# Patient Record
Sex: Female | Born: 1950 | Race: White | Hispanic: No | Marital: Married | Smoking: Former smoker
Health system: Southern US, Community
[De-identification: ages and names within clinical notes are randomized; demographics above are authoritative.]

## PROBLEM LIST (undated history)

## (undated) DIAGNOSIS — N83209 Unspecified ovarian cyst, unspecified side: Secondary | ICD-10-CM

## (undated) DIAGNOSIS — E559 Vitamin D deficiency, unspecified: Secondary | ICD-10-CM

## (undated) DIAGNOSIS — I824Y2 Acute embolism and thrombosis of unspecified deep veins of left proximal lower extremity: Secondary | ICD-10-CM

## (undated) DIAGNOSIS — I83893 Varicose veins of bilateral lower extremities with other complications: Secondary | ICD-10-CM

## (undated) DIAGNOSIS — K589 Irritable bowel syndrome without diarrhea: Secondary | ICD-10-CM

## (undated) DIAGNOSIS — K760 Fatty (change of) liver, not elsewhere classified: Secondary | ICD-10-CM

## (undated) DIAGNOSIS — A0472 Enterocolitis due to Clostridium difficile, not specified as recurrent: Secondary | ICD-10-CM

## (undated) DIAGNOSIS — N390 Urinary tract infection, site not specified: Secondary | ICD-10-CM

## (undated) DIAGNOSIS — Z1211 Encounter for screening for malignant neoplasm of colon: Secondary | ICD-10-CM

## (undated) DIAGNOSIS — Z881 Allergy status to other antibiotic agents status: Secondary | ICD-10-CM

## (undated) DIAGNOSIS — M51369 Other intervertebral disc degeneration, lumbar region without mention of lumbar back pain or lower extremity pain: Secondary | ICD-10-CM

## (undated) DIAGNOSIS — M8580 Other specified disorders of bone density and structure, unspecified site: Secondary | ICD-10-CM

## (undated) DIAGNOSIS — E785 Hyperlipidemia, unspecified: Secondary | ICD-10-CM

## (undated) DIAGNOSIS — M5136 Other intervertebral disc degeneration, lumbar region: Secondary | ICD-10-CM

## (undated) DIAGNOSIS — I82562 Chronic embolism and thrombosis of left calf muscular vein: Secondary | ICD-10-CM

## (undated) DIAGNOSIS — D649 Anemia, unspecified: Secondary | ICD-10-CM

## (undated) DIAGNOSIS — M542 Cervicalgia: Secondary | ICD-10-CM

## (undated) HISTORY — DX: Vitamin D deficiency, unspecified: E55.9

## (undated) HISTORY — PX: TUBAL LIGATION: SHX77

## (undated) HISTORY — DX: Allergy status to other antibiotic agents: Z88.1

## (undated) HISTORY — DX: Irritable bowel syndrome, unspecified: K58.9

## (undated) HISTORY — DX: Chronic embolism and thrombosis of left calf muscular vein: I82.562

## (undated) HISTORY — DX: Hyperlipidemia, unspecified: E78.5

## (undated) HISTORY — DX: Varicose veins of bilateral lower extremities with other complications: I83.893

## (undated) HISTORY — DX: Other intervertebral disc degeneration, lumbar region without mention of lumbar back pain or lower extremity pain: M51.369

## (undated) HISTORY — DX: Cervicalgia: M54.2

## (undated) HISTORY — DX: Unspecified ovarian cyst, unspecified side: N83.209

## (undated) HISTORY — DX: Other specified disorders of bone density and structure, unspecified site: M85.80

## (undated) HISTORY — DX: Acute embolism and thrombosis of unspecified deep veins of left proximal lower extremity: I82.4Y2

## (undated) HISTORY — DX: Encounter for screening for malignant neoplasm of colon: Z12.11

## (undated) HISTORY — DX: Other intervertebral disc degeneration, lumbar region: M51.36

## (undated) HISTORY — DX: Urinary tract infection, site not specified: N39.0

## (undated) HISTORY — DX: Anemia, unspecified: D64.9

## (undated) HISTORY — PX: TONSILLECTOMY: SUR1361

## (undated) HISTORY — DX: Fatty (change of) liver, not elsewhere classified: K76.0

## (undated) HISTORY — PX: CARPAL TUNNEL RELEASE: SHX101

## (undated) HISTORY — PX: ARTHROSCOPIC REPAIR ACL: SUR80

---

## 1979-08-24 HISTORY — PX: VEIN LIGATION AND STRIPPING: SHX2653

## 1986-08-23 HISTORY — PX: OVARIAN CYST REMOVAL: SHX89

## 1992-08-23 HISTORY — PX: BUNIONECTOMY: SHX129

## 1998-08-23 HISTORY — PX: CARPAL TUNNEL RELEASE: SHX101

## 2005-07-12 ENCOUNTER — Ambulatory Visit: Payer: Self-pay | Admitting: Family Medicine

## 2005-09-23 ENCOUNTER — Encounter: Payer: Self-pay | Admitting: Family Medicine

## 2005-09-23 LAB — CONVERTED CEMR LAB

## 2005-09-29 ENCOUNTER — Ambulatory Visit: Payer: Self-pay | Admitting: Family Medicine

## 2005-09-29 ENCOUNTER — Other Ambulatory Visit: Admission: RE | Admit: 2005-09-29 | Discharge: 2005-09-29 | Payer: Self-pay | Admitting: Family Medicine

## 2005-10-07 ENCOUNTER — Ambulatory Visit: Payer: Self-pay | Admitting: Family Medicine

## 2005-10-28 ENCOUNTER — Ambulatory Visit: Payer: Self-pay | Admitting: Family Medicine

## 2006-02-21 ENCOUNTER — Ambulatory Visit: Payer: Self-pay | Admitting: Family Medicine

## 2006-04-18 ENCOUNTER — Ambulatory Visit: Payer: Self-pay | Admitting: Family Medicine

## 2006-05-02 ENCOUNTER — Ambulatory Visit: Payer: Self-pay | Admitting: Family Medicine

## 2006-05-23 ENCOUNTER — Ambulatory Visit: Payer: Self-pay | Admitting: Family Medicine

## 2006-05-31 DIAGNOSIS — E785 Hyperlipidemia, unspecified: Secondary | ICD-10-CM | POA: Insufficient documentation

## 2006-07-08 ENCOUNTER — Encounter: Payer: Self-pay | Admitting: Family Medicine

## 2006-07-12 ENCOUNTER — Encounter: Payer: Self-pay | Admitting: Family Medicine

## 2006-07-12 LAB — CONVERTED CEMR LAB
ALT: 47 units/L — ABNORMAL HIGH (ref 0–35)
AST: 31 units/L (ref 0–37)
Albumin: 4.6 g/dL (ref 3.5–5.2)
Alkaline Phosphatase: 79 units/L (ref 39–117)
BUN: 12 mg/dL (ref 6–23)
CO2: 23 meq/L (ref 19–32)
Calcium: 9.8 mg/dL (ref 8.4–10.5)
Chloride: 103 meq/L (ref 96–112)
Cholesterol: 284 mg/dL — ABNORMAL HIGH (ref 0–200)
Creatinine, Ser: 1.01 mg/dL (ref 0.40–1.20)
Glucose, Bld: 87 mg/dL (ref 70–99)
HCT: 43.9 % — ABNORMAL HIGH (ref 34.4–43.3)
HDL: 79 mg/dL (ref >39)
Hemoglobin: 14.7 g/dL (ref 11.7–14.8)
LDL Cholesterol: 176 mg/dL — ABNORMAL HIGH (ref 0–99)
MCHC: 33.5 g/dL (ref 33.1–35.4)
MCV: 91.8 fL (ref 78.8–100.0)
Platelets: 289 10*3/uL (ref 152–374)
Potassium: 4.6 meq/L (ref 3.5–5.3)
RBC: 4.78 M/uL (ref 3.79–4.96)
RDW: 13.1 % (ref 11.5–15.3)
Sodium: 142 meq/L (ref 135–145)
TSH: 2.929 microintl units/mL (ref 0.350–5.50)
Total Bilirubin: 0.8 mg/dL (ref 0.3–1.2)
Total CHOL/HDL Ratio: 3.6
Total Protein: 7.3 g/dL (ref 6.0–8.3)
Triglycerides: 145 mg/dL (ref <150)
VLDL: 29 mg/dL (ref 0–40)
Vit D, 1,25-Dihydroxy: 32 (ref 20–57)
WBC: 3.9 10*3/uL (ref 3.7–10.0)

## 2006-07-20 ENCOUNTER — Ambulatory Visit: Payer: Self-pay | Admitting: Family Medicine

## 2006-07-20 DIAGNOSIS — M25569 Pain in unspecified knee: Secondary | ICD-10-CM | POA: Insufficient documentation

## 2006-07-20 DIAGNOSIS — R350 Frequency of micturition: Secondary | ICD-10-CM | POA: Insufficient documentation

## 2006-07-20 HISTORY — DX: Pain in unspecified knee: M25.569

## 2006-08-01 ENCOUNTER — Telehealth: Payer: Self-pay | Admitting: Family Medicine

## 2006-08-12 ENCOUNTER — Ambulatory Visit: Payer: Self-pay | Admitting: Family Medicine

## 2006-08-26 ENCOUNTER — Telehealth: Payer: Self-pay | Admitting: Family Medicine

## 2006-08-26 ENCOUNTER — Ambulatory Visit: Payer: Self-pay | Admitting: Family Medicine

## 2006-08-26 DIAGNOSIS — L989 Disorder of the skin and subcutaneous tissue, unspecified: Secondary | ICD-10-CM | POA: Insufficient documentation

## 2006-08-29 ENCOUNTER — Telehealth: Payer: Self-pay | Admitting: Family Medicine

## 2006-09-06 ENCOUNTER — Encounter: Payer: Self-pay | Admitting: Family Medicine

## 2006-09-14 ENCOUNTER — Telehealth: Payer: Self-pay | Admitting: Family Medicine

## 2006-09-14 ENCOUNTER — Telehealth (INDEPENDENT_AMBULATORY_CARE_PROVIDER_SITE_OTHER): Payer: Self-pay | Admitting: *Deleted

## 2007-01-24 ENCOUNTER — Telehealth: Payer: Self-pay | Admitting: Family Medicine

## 2007-01-27 ENCOUNTER — Other Ambulatory Visit: Admission: RE | Admit: 2007-01-27 | Discharge: 2007-01-27 | Payer: Self-pay | Admitting: Family Medicine

## 2007-01-27 ENCOUNTER — Encounter: Payer: Self-pay | Admitting: Family Medicine

## 2007-01-27 ENCOUNTER — Ambulatory Visit: Payer: Self-pay | Admitting: Family Medicine

## 2007-01-27 DIAGNOSIS — H9209 Otalgia, unspecified ear: Secondary | ICD-10-CM | POA: Insufficient documentation

## 2007-01-28 ENCOUNTER — Encounter: Payer: Self-pay | Admitting: Family Medicine

## 2007-01-30 ENCOUNTER — Encounter: Payer: Self-pay | Admitting: Family Medicine

## 2007-01-30 LAB — CONVERTED CEMR LAB
Cholesterol, target level: 200 mg/dL
Cholesterol: 310 mg/dL — ABNORMAL HIGH (ref 0–200)
HDL goal, serum: 40 mg/dL
HDL: 67 mg/dL (ref >39)
LDL Cholesterol: 202 mg/dL — ABNORMAL HIGH (ref 0–99)
LDL Goal: 160 mg/dL
Total CHOL/HDL Ratio: 4.6
Triglycerides: 206 mg/dL — ABNORMAL HIGH (ref <150)
VLDL: 41 mg/dL — ABNORMAL HIGH (ref 0–40)

## 2007-02-02 ENCOUNTER — Telehealth: Payer: Self-pay | Admitting: Family Medicine

## 2007-02-08 ENCOUNTER — Encounter: Payer: Self-pay | Admitting: Family Medicine

## 2007-04-11 ENCOUNTER — Ambulatory Visit: Payer: Self-pay | Admitting: Family Medicine

## 2007-04-11 DIAGNOSIS — L509 Urticaria, unspecified: Secondary | ICD-10-CM | POA: Insufficient documentation

## 2007-04-12 ENCOUNTER — Telehealth: Payer: Self-pay | Admitting: Family Medicine

## 2007-05-24 ENCOUNTER — Ambulatory Visit: Payer: Self-pay | Admitting: Family Medicine

## 2007-05-24 DIAGNOSIS — R209 Unspecified disturbances of skin sensation: Secondary | ICD-10-CM | POA: Insufficient documentation

## 2007-05-29 ENCOUNTER — Encounter: Payer: Self-pay | Admitting: Family Medicine

## 2007-06-21 ENCOUNTER — Encounter: Payer: Self-pay | Admitting: Family Medicine

## 2007-06-22 LAB — CONVERTED CEMR LAB
Folate: 19.2 ng/mL
Iron: 110 ug/dL (ref 42–145)
Saturation Ratios: 31 % (ref 20–55)
TIBC: 355 ug/dL (ref 250–470)
UIBC: 245 ug/dL
Vitamin B-12: 659 pg/mL (ref 211–911)

## 2007-06-26 ENCOUNTER — Ambulatory Visit: Payer: Self-pay | Admitting: Family Medicine

## 2007-09-27 ENCOUNTER — Ambulatory Visit: Payer: Self-pay | Admitting: Family Medicine

## 2007-09-27 DIAGNOSIS — R1032 Left lower quadrant pain: Secondary | ICD-10-CM | POA: Insufficient documentation

## 2007-09-27 LAB — CONVERTED CEMR LAB
Bilirubin Urine: NEGATIVE
Blood in Urine, dipstick: NEGATIVE
Glucose, Urine, Semiquant: NEGATIVE
Ketones, urine, test strip: NEGATIVE
Nitrite: NEGATIVE
Protein, U semiquant: NEGATIVE
Specific Gravity, Urine: 1.015
Urobilinogen, UA: 0.2
WBC Urine, dipstick: NEGATIVE
pH: 7

## 2007-09-28 DIAGNOSIS — R945 Abnormal results of liver function studies: Secondary | ICD-10-CM | POA: Insufficient documentation

## 2007-09-28 HISTORY — DX: Abnormal results of liver function studies: R94.5

## 2007-09-28 LAB — CONVERTED CEMR LAB
ALT: 55 units/L — ABNORMAL HIGH (ref 0–35)
AST: 41 units/L — ABNORMAL HIGH (ref 0–37)
Albumin: 4.6 g/dL (ref 3.5–5.2)
Alkaline Phosphatase: 69 units/L (ref 39–117)
BUN: 11 mg/dL (ref 6–23)
Basophils Absolute: 0 10*3/uL (ref 0.0–0.1)
Basophils Relative: 1 % (ref 0–1)
CA 125: 9.4 units/mL (ref 0.0–30.2)
CO2: 25 meq/L (ref 19–32)
CRP: 0.1 mg/dL (ref <0.6)
Calcium: 9.6 mg/dL (ref 8.4–10.5)
Chloride: 105 meq/L (ref 96–112)
Creatinine, Ser: 0.85 mg/dL (ref 0.40–1.20)
Eosinophils Absolute: 0.1 10*3/uL (ref 0.0–0.7)
Eosinophils Relative: 3 % (ref 0–5)
Glucose, Bld: 84 mg/dL (ref 70–99)
HCT: 42.6 % (ref 36.0–46.0)
Hemoglobin: 14.5 g/dL (ref 12.0–15.0)
Lymphocytes Relative: 46 % (ref 12–46)
Lymphs Abs: 2 10*3/uL (ref 0.7–4.0)
MCHC: 34 g/dL (ref 30.0–36.0)
MCV: 92.6 fL (ref 78.0–100.0)
Monocytes Absolute: 0.3 10*3/uL (ref 0.1–1.0)
Monocytes Relative: 7 % (ref 3–12)
Neutro Abs: 1.9 10*3/uL (ref 1.7–7.7)
Neutrophils Relative %: 44 % (ref 43–77)
Platelets: 297 10*3/uL (ref 150–400)
Potassium: 4.5 meq/L (ref 3.5–5.3)
RBC: 4.6 M/uL (ref 3.87–5.11)
RDW: 13.3 % (ref 11.5–15.5)
Sodium: 142 meq/L (ref 135–145)
Total Bilirubin: 0.6 mg/dL (ref 0.3–1.2)
Total Protein: 7.1 g/dL (ref 6.0–8.3)
WBC: 4.3 10*3/uL (ref 4.0–10.5)

## 2007-10-05 ENCOUNTER — Encounter: Payer: Self-pay | Admitting: Family Medicine

## 2007-10-10 ENCOUNTER — Telehealth: Payer: Self-pay | Admitting: Family Medicine

## 2007-10-10 ENCOUNTER — Encounter: Payer: Self-pay | Admitting: Family Medicine

## 2007-10-30 ENCOUNTER — Telehealth: Payer: Self-pay | Admitting: Family Medicine

## 2007-11-06 ENCOUNTER — Encounter: Payer: Self-pay | Admitting: Family Medicine

## 2007-11-07 LAB — CONVERTED CEMR LAB
ALT: 36 units/L — ABNORMAL HIGH (ref 0–35)
AST: 30 units/L (ref 0–37)
Albumin: 4.5 g/dL (ref 3.5–5.2)
Alkaline Phosphatase: 69 units/L (ref 39–117)
BUN: 8 mg/dL (ref 6–23)
CO2: 25 meq/L (ref 19–32)
Calcium: 9.4 mg/dL (ref 8.4–10.5)
Chloride: 105 meq/L (ref 96–112)
Cholesterol: 237 mg/dL — ABNORMAL HIGH (ref 0–200)
Creatinine, Ser: 0.81 mg/dL (ref 0.40–1.20)
Glucose, Bld: 88 mg/dL (ref 70–99)
HDL: 57 mg/dL (ref >39)
LDL Cholesterol: 125 mg/dL — ABNORMAL HIGH (ref 0–99)
Potassium: 4.5 meq/L (ref 3.5–5.3)
Sodium: 143 meq/L (ref 135–145)
Total Bilirubin: 0.6 mg/dL (ref 0.3–1.2)
Total CHOL/HDL Ratio: 4.2
Total Protein: 6.7 g/dL (ref 6.0–8.3)
Triglycerides: 277 mg/dL — ABNORMAL HIGH (ref <150)
VLDL: 55 mg/dL — ABNORMAL HIGH (ref 0–40)

## 2007-11-22 LAB — HM COLONOSCOPY: HM Colonoscopy: NORMAL

## 2007-11-30 ENCOUNTER — Ambulatory Visit: Payer: Self-pay | Admitting: Internal Medicine

## 2007-12-14 ENCOUNTER — Encounter: Payer: Self-pay | Admitting: Internal Medicine

## 2007-12-14 ENCOUNTER — Ambulatory Visit: Payer: Self-pay | Admitting: Internal Medicine

## 2007-12-19 ENCOUNTER — Telehealth: Payer: Self-pay | Admitting: Family Medicine

## 2008-01-04 ENCOUNTER — Telehealth: Payer: Self-pay | Admitting: Family Medicine

## 2008-03-14 ENCOUNTER — Ambulatory Visit: Payer: Self-pay | Admitting: Family Medicine

## 2008-04-03 ENCOUNTER — Telehealth: Payer: Self-pay | Admitting: Family Medicine

## 2008-04-09 ENCOUNTER — Encounter: Admission: RE | Admit: 2008-04-09 | Discharge: 2008-04-09 | Payer: Self-pay | Admitting: Family Medicine

## 2008-04-09 ENCOUNTER — Encounter: Payer: Self-pay | Admitting: Family Medicine

## 2008-10-07 ENCOUNTER — Encounter: Admission: RE | Admit: 2008-10-07 | Discharge: 2008-10-07 | Payer: Self-pay | Admitting: Family Medicine

## 2008-10-07 ENCOUNTER — Ambulatory Visit: Payer: Self-pay | Admitting: Family Medicine

## 2008-10-07 DIAGNOSIS — R0789 Other chest pain: Secondary | ICD-10-CM | POA: Insufficient documentation

## 2008-10-08 LAB — CONVERTED CEMR LAB
ALT: 29 units/L (ref 0–35)
AST: 20 units/L (ref 0–37)
Albumin: 4.6 g/dL (ref 3.5–5.2)
Alkaline Phosphatase: 66 units/L (ref 39–117)
BUN: 14 mg/dL (ref 6–23)
Basophils Absolute: 0 10*3/uL (ref 0.0–0.1)
Basophils Relative: 1 % (ref 0–1)
CO2: 23 meq/L (ref 19–32)
Calcium: 9.9 mg/dL (ref 8.4–10.5)
Chloride: 106 meq/L (ref 96–112)
Creatinine, Ser: 1.04 mg/dL (ref 0.40–1.20)
Eosinophils Absolute: 0.1 10*3/uL (ref 0.0–0.7)
Eosinophils Relative: 3 % (ref 0–5)
Glucose, Bld: 86 mg/dL (ref 70–99)
HCT: 39.7 % (ref 36.0–46.0)
Hemoglobin: 13.5 g/dL (ref 12.0–15.0)
Lymphocytes Relative: 44 % (ref 12–46)
Lymphs Abs: 2.3 10*3/uL (ref 0.7–4.0)
MCHC: 34 g/dL (ref 30.0–36.0)
MCV: 89 fL (ref 78.0–100.0)
Monocytes Absolute: 0.5 10*3/uL (ref 0.1–1.0)
Monocytes Relative: 10 % (ref 3–12)
Neutro Abs: 2.2 10*3/uL (ref 1.7–7.7)
Neutrophils Relative %: 43 % (ref 43–77)
Platelets: 283 10*3/uL (ref 150–400)
Potassium: 4.7 meq/L (ref 3.5–5.3)
RBC: 4.46 M/uL (ref 3.87–5.11)
RDW: 13 % (ref 11.5–15.5)
Sodium: 142 meq/L (ref 135–145)
TSH: 1.858 microintl units/mL (ref 0.350–4.50)
Total Bilirubin: 0.4 mg/dL (ref 0.3–1.2)
Total Protein: 7 g/dL (ref 6.0–8.3)
WBC: 5.2 10*3/uL (ref 4.0–10.5)

## 2009-03-11 ENCOUNTER — Telehealth: Payer: Self-pay | Admitting: Family Medicine

## 2009-03-24 ENCOUNTER — Encounter: Payer: Self-pay | Admitting: Family Medicine

## 2009-03-24 LAB — HM MAMMOGRAPHY: HM Mammogram: NORMAL

## 2009-03-27 ENCOUNTER — Encounter: Payer: Self-pay | Admitting: Family Medicine

## 2009-04-22 ENCOUNTER — Encounter: Admission: RE | Admit: 2009-04-22 | Discharge: 2009-04-22 | Payer: Self-pay | Admitting: Family Medicine

## 2009-04-22 ENCOUNTER — Ambulatory Visit: Payer: Self-pay | Admitting: Family Medicine

## 2009-09-26 ENCOUNTER — Encounter: Payer: Self-pay | Admitting: Family Medicine

## 2009-10-23 ENCOUNTER — Ambulatory Visit: Payer: Self-pay | Admitting: Family Medicine

## 2009-10-23 ENCOUNTER — Encounter: Admission: RE | Admit: 2009-10-23 | Discharge: 2009-10-23 | Payer: Self-pay | Admitting: Family Medicine

## 2009-10-23 DIAGNOSIS — M79609 Pain in unspecified limb: Secondary | ICD-10-CM | POA: Insufficient documentation

## 2009-10-24 ENCOUNTER — Telehealth: Payer: Self-pay | Admitting: Family Medicine

## 2009-10-25 ENCOUNTER — Encounter: Payer: Self-pay | Admitting: Family Medicine

## 2009-10-28 ENCOUNTER — Encounter: Payer: Self-pay | Admitting: Family Medicine

## 2009-10-28 LAB — CONVERTED CEMR LAB
Anti Nuclear Antibody(ANA): NEGATIVE
Basophils Absolute: 0 10*3/uL (ref 0.0–0.1)
Basophils Relative: 1 % (ref 0–1)
CRP: 0.1 mg/dL (ref <0.6)
Eosinophils Absolute: 0.1 10*3/uL (ref 0.0–0.7)
Eosinophils Relative: 3 % (ref 0–5)
HCT: 40.9 % (ref 36.0–46.0)
Hemoglobin: 13.5 g/dL (ref 12.0–15.0)
Lymphocytes Relative: 48 % — ABNORMAL HIGH (ref 12–46)
Lymphs Abs: 2.5 10*3/uL (ref 0.7–4.0)
MCHC: 33 g/dL (ref 30.0–36.0)
MCV: 91.9 fL (ref 78.0–100.0)
Monocytes Absolute: 0.4 10*3/uL (ref 0.1–1.0)
Monocytes Relative: 8 % (ref 3–12)
Neutro Abs: 2.2 10*3/uL (ref 1.7–7.7)
Neutrophils Relative %: 41 % — ABNORMAL LOW (ref 43–77)
Platelets: 270 10*3/uL (ref 150–400)
RBC: 4.45 M/uL (ref 3.87–5.11)
RDW: 13 % (ref 11.5–15.5)
Rhuematoid fact SerPl-aCnc: <20 intl units/mL (ref 0–20)
Sed Rate: 5 mm/hr (ref 0–22)
WBC: 5.3 10*3/uL (ref 4.0–10.5)

## 2009-12-10 ENCOUNTER — Other Ambulatory Visit: Admission: RE | Admit: 2009-12-10 | Discharge: 2009-12-10 | Payer: Self-pay | Admitting: Family Medicine

## 2009-12-10 ENCOUNTER — Ambulatory Visit: Payer: Self-pay | Admitting: Family Medicine

## 2009-12-10 DIAGNOSIS — L608 Other nail disorders: Secondary | ICD-10-CM | POA: Insufficient documentation

## 2009-12-10 LAB — CONVERTED CEMR LAB
Bilirubin Urine: NEGATIVE
Blood in Urine, dipstick: NEGATIVE
Glucose, Urine, Semiquant: NEGATIVE
Ketones, urine, test strip: NEGATIVE
Nitrite: NEGATIVE
Protein, U semiquant: NEGATIVE
Specific Gravity, Urine: 1.015
Urobilinogen, UA: 0.2
WBC Urine, dipstick: NEGATIVE
pH: 7.5

## 2009-12-11 ENCOUNTER — Encounter: Payer: Self-pay | Admitting: Family Medicine

## 2009-12-11 LAB — CONVERTED CEMR LAB
ALT: 44 units/L — ABNORMAL HIGH (ref 0–35)
AST: 26 units/L (ref 0–37)
Albumin: 4.5 g/dL (ref 3.5–5.2)
Alkaline Phosphatase: 63 units/L (ref 39–117)
BUN: 10 mg/dL (ref 6–23)
CO2: 23 meq/L (ref 19–32)
Calcium: 9.7 mg/dL (ref 8.4–10.5)
Chloride: 103 meq/L (ref 96–112)
Cholesterol: 283 mg/dL — ABNORMAL HIGH (ref 0–200)
Creatinine, Ser: 0.86 mg/dL (ref 0.40–1.20)
Glucose, Bld: 84 mg/dL (ref 70–99)
HDL: 62 mg/dL (ref >39)
LDL Cholesterol: 173 mg/dL — ABNORMAL HIGH (ref 0–99)
Potassium: 4.4 meq/L (ref 3.5–5.3)
Sodium: 139 meq/L (ref 135–145)
TSH: 2.762 microintl units/mL (ref 0.350–4.500)
Total Bilirubin: 0.8 mg/dL (ref 0.3–1.2)
Total CHOL/HDL Ratio: 4.6
Total Protein: 6.8 g/dL (ref 6.0–8.3)
Triglycerides: 239 mg/dL — ABNORMAL HIGH (ref <150)
VLDL: 48 mg/dL — ABNORMAL HIGH (ref 0–40)

## 2009-12-15 LAB — CONVERTED CEMR LAB
Pap Smear: NEGATIVE
Pap Smear: NORMAL

## 2009-12-19 ENCOUNTER — Encounter: Admission: RE | Admit: 2009-12-19 | Discharge: 2009-12-19 | Payer: Self-pay | Admitting: Family Medicine

## 2010-02-14 ENCOUNTER — Ambulatory Visit: Payer: Self-pay | Admitting: Family Medicine

## 2010-02-16 ENCOUNTER — Telehealth (INDEPENDENT_AMBULATORY_CARE_PROVIDER_SITE_OTHER): Payer: Self-pay | Admitting: *Deleted

## 2010-02-16 ENCOUNTER — Telehealth: Payer: Self-pay | Admitting: Family Medicine

## 2010-02-16 ENCOUNTER — Telehealth: Payer: Self-pay | Admitting: Emergency Medicine

## 2010-05-14 ENCOUNTER — Ambulatory Visit: Payer: Self-pay | Admitting: Family Medicine

## 2010-05-14 ENCOUNTER — Encounter: Admission: RE | Admit: 2010-05-14 | Discharge: 2010-05-14 | Payer: Self-pay | Admitting: Family Medicine

## 2010-05-14 DIAGNOSIS — M542 Cervicalgia: Secondary | ICD-10-CM | POA: Insufficient documentation

## 2010-09-13 ENCOUNTER — Encounter: Payer: Self-pay | Admitting: Family Medicine

## 2010-09-24 NOTE — Progress Notes (Signed)
Summary: Question about colonoscopy  Phone Note Call from Patient Call back at Home Phone [phone]   Caller: Patient Call For: Nani Gasser MD Summary of Call: Pt wants your opinion on where to go for colonoscopy. Seen Dr. Orpah Cobb at [facility] and he does in office colons but wanted your opinion whether to have done with him or if you know a better GI to do procedure Initial call taken by: Kathlene November,  October 10, 2007 3:22 PM  Follow-up for Phone Call        Called pt: The main concern is safety adn cleanliness of an outpatient facility. She is otherwise helahty and is a good candidate for outpatient but certainely if she has concerns then can call their office and see if there is an option to do it at the hospital or a day surgey center.  Follow-up by: Nani Gasser MD,  October 10, 2007 4:52 PM

## 2010-09-24 NOTE — Letter (Signed)
Summary: Lipid Letter  [facility]  [address]   [place], [place] [address]   Phone: [phone]  Fax: [phone]    01/30/2007  Tiffany Bender [address] [place], [place]  [address]  Dear Corrie Dandy:  We have carefully reviewed your last lipid profile from 01/28/2007 and the results are noted below with a summary of recommendations for lipid management.    Cholesterol:      310     Goal: <200   HDL "good" Cholesterol:   67     Goal: >49   LDL "bad" Cholesterol:    956     Goal: <160   Triglycerides:      206     Goal: <150    Your lipid goals have not been met; we recommend improving on your TLC diet and Adjunctive Measures (see below) and make the following changes to your regimen.  Please reconsider a cholesterol lowering medication. We can recheck levels in 3 months.:    TLC Diet (Therapeutic Lifestyle Change): Total Fat should be no greater than 25-35% of total calories Carbohydrates should be 50-60% of total calories Protein should be approximately 15% of total calories Fiber should be at least 20-30 grams a day ***Increased fiber may help lower LDL Total Cholesterol should be < 200mg /day Consider adding plant stanol/sterols to diet (example: Benacol spread) ***A higher intake of unsaturated fat may reduce Triglycerides and Increase HDL    Adjunctive Measures (may lower LIPIDS and reduce risk of Heart Attack) include: Aerobic Exercise (20-30 minutes 3-4 times a week) Limit Alcohol Consumption Weight Reduction Aspirin 75-81 mg a day by mouth (if not allergic or contraindicated) Folic Acid 1mg  a day by mouth Dietary Fiber 20-30 grams a day by mouth     Current Medications: 1)    Calcium-vitamin D 250-125 Mg-unit Tabs (Calcium carbonate-vitamin d) 2)    Multivitamins  Tabs (Multiple vitamin) .... Once daily 3)    Sea-omega 1000 Mg Caps (omega-3 Fatty Acids)   If you have any questions, please call. We appreciate being able to work  with you.   Sincerely,    [facility] Nani Gasser MD
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
## 2010-09-24 NOTE — Assessment & Plan Note (Signed)
Summary: POSS SPIDER BITE/PAM   Vitals Entered By: Edmonia Lynch (August 12, 2006 2:29 PM)             Last PAP Result Done PapHx  Done (09/23/2005 10:54:11 AM)     Prior Medications: CALCIUM-VITAMIN D 250-125 MG-UNIT TABS (CALCIUM CARBONATE-VITAMIN D)  MULTIVITAMINS  TABS (MULTIPLE VITAMIN) once daily LOVAZA 1 GM CAPS (OMEGA-3-ACID ETHYL ESTERS) 4 tabs by mouth daily Current Allergies: CIPRO Visit Type:  acute PCP:  Linford Arnold, C  Chief Complaint:  C/O Spider bite right leg.  History of Present Illness: 60 yo WF presents for an itchy scaley spot on the back of her right leg.  It has been there for over 1 month. She thinks it was a spider bite that occured while working out in the yard, but never did see an Armed forces logistics/support/administrative officer.  It has been itchy, especially after contact with water for the last month.  She is trying not to scratch it.  It is not oozing or bleeding.  Not using anything topically.  O/w has been well, no fevers or body rash.  Past Medical History:    Boderline Osteopenia     G2 P2     Hx Ovarian cysts     Meds: Red Yeast Rice Capsules, Omega-3, nail vitam  Past Surgical History:    ACL repair     R carpal tunnel syndrome sx-Dr. Manson Passey  Family History:    Father-alcoholism     O/W Adopted and fam Hx unknown  Physical Exam  General:     alert, well-developed, well-nourished, and well-hydrated.   Head:     normocephalic and atraumatic.   Skin:     1.5 cm poorly defined scaley pink skin lesion at the distal R calf.  No surrounding erythema, non-vesicular   Review of Systems      See HPI  Impression & Recommendations:  Problem # 1:  PRURITIC DISORDER NOS (ICD-698.9) Skin lesion of unknown etilology with pruritis.  Probable healing insect bite.  Will treat for itching with Elocon 1% ointment at bedtime for 1 wk.  If not improving, recommend taking another look at it.  SCC can sometimes mimic this appearance.  Recommend not scratching as she may introduce skin  bacteria.  Medications Added to Medication List This Visit: 1)  Elocon 0.1 % Oint (Mometasone furoate) .... Apply at bedtime to skin x 7 days  Patient Instructions: 1)  Try not to scratch itchy spot on leg. 2)  Use Elocon ointment at bedtime for 1 week. 3)  If not improving after 7 days, please call and make appt to come back.

## 2010-09-24 NOTE — Progress Notes (Signed)
Summary: Discussion of Colonoscopy Results  Phone Note Call from Patient Call back at Home Phone [phone]   Caller: Patient Call For: Nani Gasser MD Reason for Call: Talk to Doctor Details for Reason: Results of Colonoscopy Summary of Call: Patient called and told me that she had her colonoscopy last week. Her GI Doctor told her to discuss the results of the colonoscopy with you. Her phone number is [phone]. She will be out this morning and will return this afternoon.  Initial call taken by: Jolyne Loa RN,  December 19, 2007 9:11 AM  Follow-up for Phone Call        Please let her know I have not received the colonoscopy report yet.  Follow-up by: Nani Gasser MD,  December 19, 2007 12:09 PM  Additional Follow-up for Phone Call Additional follow up Details #1::        PATIENT INFORMED. Additional Follow-up by: Harlene Salts,  December 19, 2007 4:32 PM

## 2010-09-24 NOTE — Assessment & Plan Note (Signed)
Summary: Neck Pain, tinnitis   Vital Signs:  Patient profile:   60 year old female Menstrual status:  postmenopausal Height:      64 inches Weight:      159 pounds Pulse rate:   78 / minute BP sitting:   138 / 85  (right arm) Cuff size:   regular  Vitals Entered By: Avon Gully CMA, Duncan Dull) (May 14, 2010 10:06 AM) CC:  neck stiffness, ringing in the left ear x 3 weeks, had been seeing a chiropracter who had been working on her neck  Hearing Screen  20db HL: Left  500 hz: No Response 1000 hz: 20db 2000 hz: 20db 4000 hz: No Response Right  500 hz: No Response 1000 hz: No Response 2000 hz: No Response 4000 hz: No Response   Hearing Testing Entered By: Avon Gully CMA, (AAMA) (May 14, 2010 10:35 AM) 25db HL: Left  500 hz: No Response 1000 hz: 25db 2000 hz: 25db 4000 hz: 25db Right  500 hz: No Response 1000 hz: No Response 2000 hz: No Response 4000 hz: No Response  40db HL: Left  Right  500 hz: No Response 1000 hz: 40db 2000 hz: 40db 4000 hz: 40db    Primary Care Provider:  Nani Gasser MD  CC:   neck stiffness, ringing in the left ear x 3 weeks, and had been seeing a chiropracter who had been working on her neck.  History of Present Illness:  neck stiffness, ringing in the left ear x 3 weeks, had been seeing a chiropracter who had been working on her neck. Slipped on a dog bone in April anf fel sideways and her left knee twisted and popped. Went to the ED and had xrays of her left knee adn they were neg. Then went to see Dr. Daphine Deutscher and damaged her ACL and MCL. Started PT.  then her back started to get worse.  Then had a pinched nerve and went to the UC and put on Motrin three times a day. No xrays.  Went to see Dr. Myrtis Ser adn has been getting adjustements with the back and neck. After first adjustment felt like was going to fall asleep instantly.  Now having ringing in her left ear for the last 3 weeks. Often a day after her adustement will  feel she is having a hard time swallowing.   Still using her knee brace some. And can't lseep on the at side on the left.    Current Medications (verified): 1)  Calcium-Vitamin D 250-125 Mg-Unit Tabs (Calcium Carbonate-Vitamin D) 2)  Multivitamins  Tabs (Multiple Vitamin) .... Once Daily 3)  Red Yeast Rice Extract 600 Mg  Caps (Red Yeast Rice Extract) 4)  Vitamin D 1000 Unit Tabs (Cholecalciferol) .... Take One Tablet By Mouth Once A Day 5)  Vitamin C 500 Mg Tabs (Ascorbic Acid) .... Take One Tablet By Mouth Once A Day  Allergies (verified): No Known Drug Allergies  Comments:  Nurse/Medical Assistant: The patient's medications and allergies were reviewed with the patient and were updated in the Medication and Allergy Lists. Avon Gully CMA, Duncan Dull) (May 14, 2010 10:07 AM)  Physical Exam  General:  Well-developed,well-nourished,in no acute distress; alert,appropriate and cooperative throughout examination Head:  Normocephalic and atraumatic without obvious abnormalities. No apparent alopecia or balding. Ears:  External ear exam shows no significant lesions or deformities.  Otoscopic examination reveals clear canals, tympanic membranes are intact bilaterally without bulging, retraction, inflammation or discharge. Hearing is grossly normal bilaterally. Nose:  no external  deformity.   Mouth:  Oral mucosa and oropharynx without lesions or exudates.  Teeth in good repair. Heart:  Normal rate and regular rhythm. S1 and S2 normal without gallop, murmur, click, rub or other extra sounds. NO carotid bruits.  Msk:  Neck with normal flexion, dec extension. Slightly dec rotation right and left. More on the left.  Normal sidebending but uncomfortable. Nontender over the cervical spine or paraspinous muscles.   Skin:  no rashes.   Cervical Nodes:  no anterior cervical adenopathy.   Psych:  Cognition and judgment appear intact. Alert and cooperative with normal attention span and  concentration. No apparent delusions, illusions, hallucinations   Impression & Recommendations:  Problem # 1:  NECK PAIN (ICD-723.1) Assessment New  Dsicussed will get xrays today to see if anthing has changed since she has gotten worse in the last 3 weeks. Really her neck wasn't hurting unstil started getting manipulations at the chiropracters office.  Will call wih the results.  Trial of muscle relaxer over the weekend to see if helps her stiffnes.  Do recommmend starting gentle exercises for her neck. Given H.O. Orders: T-DG Cervical Spine 2-3 Views ([id number])  Her updated medication list for this problem includes:    Cyclobenzaprine Hcl 5 Mg Tabs (Cyclobenzaprine hcl) .Marland Kitchen... Take 1/2 - 1  tablet by mouth once a day  Problem # 2:  TINNITUS (ICD-388.30) Assessment: New  Dsicussed that may be from her neck but may be unrelated. Her ear exam is nromal. Hearing screen was normal on the left (thought dec on the right).  Tympanogram showed flattened curve which can indicate infectin. Thought her exam is normal will tx for OM and see if her sxs resolve. If not then will refer to ENT for further evaluation.   Orders: Tympanometry ([id number]) Hearing Screening (40102)  Complete Medication List: 1)  Calcium-vitamin D 250-125 Mg-unit Tabs (Calcium carbonate-vitamin d) 2)  Multivitamins Tabs (Multiple vitamin) .... Once daily 3)  Red Yeast Rice Extract 600 Mg Caps (Red yeast rice extract) 4)  Vitamin D 1000 Unit Tabs (Cholecalciferol) .... Take one tablet by mouth once a day 5)  Vitamin C 500 Mg Tabs (Ascorbic acid) .... Take one tablet by mouth once a day 6)  Cyclobenzaprine Hcl 5 Mg Tabs (Cyclobenzaprine hcl) .... Take 1/2 - 1  tablet by mouth once a day 7)  Amoxicillin 875 Mg Tabs (Amoxicillin) .... Take 1 tablet by mouth two times a day for 10 days  Patient Instructions: 1)  Start the neck stretches.  If painful then stope and retry in a few days 2)  Moist heat to the area. 3)  We will  call you with the xray results tomorrow 4)  Try the muscle relaxer through the weekend.  Prescriptions: CYCLOBENZAPRINE HCL 5 MG TABS (CYCLOBENZAPRINE HCL) Take 1/2 - 1  tablet by mouth once a day  #30 x 0   Entered and Authorized by:   Nani Gasser MD   Signed by:   Nani Gasser MD on 05/14/2010   Method used:   Electronically to        [facility] (retail)       [address] [place], [place]  [address]       Ph: [phone]       Fax: [phone]   RxID:   [id number] AMOXICILLIN 875 MG TABS (AMOXICILLIN) Take 1 tablet by mouth two times a day for 10 days  #20 x  0   Entered and Authorized by:   Nani Gasser MD   Signed by:   Nani Gasser MD on 05/14/2010   Method used:   Electronically to        [facility] (retail)       [address] [place], [place]  [address]       Ph: [phone]       Fax: [phone]   RxID:   [id number] CYCLOBENZAPRINE HCL 10 MG TABS (CYCLOBENZAPRINE HCL) 1/2 to 1 tab in the evening for stiffness and muscle pain  #30 x 0   Entered and Authorized by:   Nani Gasser MD   Signed by:   Nani Gasser MD on 05/14/2010   Method used:   Electronically to        [facility] (retail)       [address] [place], [place]  [address]       Ph: [phone]       Fax: [phone]   RxID:   [id number]

## 2010-09-24 NOTE — Assessment & Plan Note (Signed)
Summary: RIGHT RING FINGER HURTS   Vital Signs:  Patient profile:   60 year old female Height:      64 inches Weight:      158 pounds BMI:     27.22 Pulse rate:   71 / minute BP sitting:   118 / 69  (left arm) Cuff size:   regular  Vitals Entered By: Kathlene November (October 23, 2009 3:24 PM) CC: right ring finger swollen and painful   Primary Care Provider:  Nani Gasser MD  CC:  right ring finger swollen and painful.  History of Present Illness: right ring finger swollen and painful.  No trauma. Noticed swelling for 1-2 months. Can't get rings on that she usuall wears..  Now hurting more. Wose if washes her hands.  No change in flexibility. Not locking or popping.  Can still write but is uncomfortable and she is right handed.  Also feels some slight swelling on the left ring finger as well. Can no longer get her wedding band on and off. Not really using any pain medications.  Pain is intermittant.   Current Medications (verified): 1)  Calcium-Vitamin D 250-125 Mg-Unit Tabs (Calcium Carbonate-Vitamin D) 2)  Multivitamins  Tabs (Multiple Vitamin) .... Once Daily 3)  Red Yeast Rice Extract 600 Mg  Caps (Red Yeast Rice Extract)  Allergies (verified): No Known Drug Allergies  Comments:  Nurse/Medical Assistant: The patient's medications and allergies were reviewed with the patient and were updated in the Medication and Allergy Lists. Kathlene November (October 23, 2009 3:24 PM)  Physical Exam  General:  Well-developed,well-nourished,in no acute distress; alert,appropriate and cooperative throughout examination Msk:  Right hand ring finger, PIP swollen and mildy erythematous . Slighty tender at the over the PIP.  Nontender DIP and MCP.  Maybe some trace swelling over teh left ring finger PIP.  NOrmal R oM of all fingers and strengh 5/5 in the ring finger.  No skin lesion.     Impression & Recommendations:  Problem # 1:  FINGER PAIN (ICD-729.5) With pain and swelling trauma is  possible though less likely to be a fracture. Consider OA vs RA versus tendonapthy.  Will get xray to rule out fracture or signs of OA. if normal then consider sending to ortho if xrays are negative. I dont' think she needs a rheumatologic w/u at this point since no other joints involved. Family hx is unknown since she is adopted.     Orders: T-*Unlisted Diagnostic X-ray test/procedure ([id number])  Complete Medication List: 1)  Calcium-vitamin D 250-125 Mg-unit Tabs (Calcium carbonate-vitamin d) 2)  Multivitamins Tabs (Multiple vitamin) .... Once daily 3)  Red Yeast Rice Extract 600 Mg Caps (Red yeast rice extract)

## 2010-09-24 NOTE — Progress Notes (Signed)
Summary: labs  Phone Note Call from Patient Call back at Home Phone [phone]   Caller: Patient Call For: Nani Gasser MD Summary of Call: Pt calls back and states that she would also like to have labwork done Initial call taken by: Kathlene November,  October 24, 2009 8:04 AM  Follow-up for Phone Call        order sent.  Follow-up by: Nani Gasser MD,  October 24, 2009 8:05 AM

## 2010-09-24 NOTE — Progress Notes (Signed)
Summary: FU colonoscopy report  Phone Note Call from Patient Call back at Home Phone [phone]   Caller: Patient Call For: Nani Gasser MD Summary of Call: Pt calls wanting to know if you had received her colon results and wanted to discuss with you the results. 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  REcommend switch to stool softeners. She aslo had questions about her cholesterol Feels like the Fish OIl was making her retain fat and feels better since stopping it.  Follow-up by: Nani Gasser MD,  Jan 04, 2008 10:32 AM    New/Updated Medications: RED YEAST RICE EXTRACT 600 MG  CAPS (RED YEAST RICE EXTRACT)

## 2010-09-24 NOTE — Letter (Signed)
Summary: [facility]  [facility]   Imported By: Lanelle Bal 10/16/2009 14:16:48  _____________________________________________________________________  External Attachment:    Type:   Image     Comment:   External Document

## 2010-09-24 NOTE — Progress Notes (Signed)
Summary: QUESTION ABOUT SIDE EFFECTS OF DEPO-MEDROL  Phone Note Call from Patient Call back at [phone]   Caller: Patient Call For: Nani Gasser MD Summary of Call: PATIENT CALLED TO SAY THAT SHE IS HAVING NUMBNESS AND TINGLING IN HER FEET AND WANTED TO KNOW IF THIS COULD BE A SIDE EFFECT OF THE DEPOMEDROL THAT SHE WAS GIVEN YESTERDAY. NURSE INFORMED MD AND REASSURED PATIENT THAT THIS WASN'T A SIDE EFFECT FROM THE SHOT. NURSE INFORMED PATIENT THAT IF SHE WAS HAVING NUMBNESS AND TINGLING THEN SHE NEEDED TO COME TO THE OFFICE FOR OV AFTER SCHEDULING. PATIENT VERBALIZED UNDERSTANDING OF PLAN AND WAS ALSO COMPLAINING THAT OUR MESSAGE SYSTEM WASN'T EFFECTIVE. PLEASE ADVISE.  Initial call taken by: Harlene Salts,  April 12, 2007 3:41 PM  Follow-up for Phone Call        I agree.  Numbness and tingling not from steroid. MAke appt if sensation persists through the weekend.  Follow-up by: Nani Gasser MD,  April 13, 2007 9:53 AM

## 2010-09-24 NOTE — Assessment & Plan Note (Signed)
Summary: Atypical chest pain   Vital Signs:  Patient Profile:   60 Years Old Female Height:     64 inches Weight:      157 pounds O2 Sat:      96 % Pulse rate:   76 / minute BP sitting:   125 / 69  (left arm) Cuff size:   regular  Vitals Entered By: Kathlene November (October 07, 2008 3:09 PM)                 PCP:  Nani Gasser MD  Chief Complaint:  coldness on left side of chest, weak, and sx's come and go over weekend but today are constant.  History of Present Illness: Noticed a cold sensation around her left breast a couple of days ago that started only at night when lying flat.  Now it is constant.  Now occ a a dull pain.  No dysphagia. Took 2 TUMS and no relief.  No palpitations. Felt dizzy and weak this AM but better now.  No recent URI or fever. Moving might intensify it.  No alleviating sxs. No SOB or cough or wheeze. No heartubrn sxs.  Does drink alot of coffee.     Current Allergies: No known allergies       Physical Exam  General:     Well-developed,well-nourished,in no acute distress; alert,appropriate and cooperative throughout examination Head:     Normocephalic and atraumatic without obvious abnormalities. No apparent alopecia or balding. Eyes:     No corneal or conjunctival inflammation noted. EOMI. Perrla. Ears:     External ear exam shows no significant lesions or deformities.  Otoscopic examination reveals clear canals, tympanic membranes are intact bilaterally without bulging, retraction, inflammation or discharge. Hearing is grossly normal bilaterally. Nose:     External nasal examination shows no deformity or inflammation. Mouth:     Oral mucosa and oropharynx without lesions or exudates.  Teeth in good repair. Neck:     No deformities, masses, or tenderness noted. Lungs:     Normal respiratory effort, chest expands symmetrically. Lungs are clear to auscultation, no crackles or wheezes. Heart:     Normal rate and regular rhythm. S1 and  S2 normal without gallop, murmur, click, rub or other extra sounds. Skin:     no rashes.   Psych:     Cognition and judgment appear intact. Alert and cooperative with normal attention span and concentration. No apparent delusions, illusions, hallucinations    Impression & Recommendations:  Problem # 1:  CHEST PAIN, ATYPICAL (ICD-786.59) Discusssed not likely cardiac.  EKG showed NSR wiht no acute abnormaliities.   Will reule out anemia, thyroid d/o etc as well.  CXR since pulse ox a little low though not SOB.   Orders: T-CBC w/Diff ([id number]) T-Comprehensive Metabolic Panel [id number]) T-TSH [id number]) T-Chest x-ray, 2 views ([id number])   Complete Medication List: 1)  Calcium-vitamin D 250-125 Mg-unit Tabs (Calcium carbonate-vitamin d) 2)  Multivitamins Tabs (Multiple vitamin) .... Once daily 3)  Red Yeast Rice Extract 600 Mg Caps (Red yeast rice extract)  Appended Document: Atypical chest pain

## 2010-09-24 NOTE — Progress Notes (Signed)
Summary: Order

## 2010-09-24 NOTE — Progress Notes (Signed)
----   Converted from flag ---- ---- 08/29/2006 1:10 PM, Edmonia Lynch wrote: Called patient and informed her of the findings and she will restart the steroid cream for a wk.  ---- 08/29/2006 11:26 AM, Harlene Salts wrote:   ---- 08/29/2006 9:39 AM, Nani Gasser MD wrote: Call pt and let her know no fungus on scraping.  Go ahead and restart steroid cream for one week.  If not healing will need to do a biopsy. ------------------------------

## 2010-09-24 NOTE — Letter (Signed)
Summary: [facility]  [facility]LEASE [facility]M   Imported By: Harlene Salts 06/14/2007 14:32:00  _____________________________________________________________________  External Attachment:    Type:   Image     Comment:   External Document

## 2010-09-24 NOTE — Consult Note (Signed)
Summary: [facility]  [facility]   Imported By: Harlene Salts 10/26/2006 14:45:36  _____________________________________________________________________  External Attachment:    Type:   Image     Comment:   External Document
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
## 2010-09-24 NOTE — Progress Notes (Signed)
  Phone Note Call from Patient Call back at Home Phone [phone]   Caller: Spouse Summary of Call: Patient still having severe back pain.  Ibuprofen is not working.  Would like pain medication called into pharmacy.  Patients number [id number]. Pharmacy [facility] [address] [place]. Initial call taken by: Dannette Barbara,  February 16, 2010 8:22 AM  Follow-up for Phone Call        Since I have never seen this patient and do not know how severe the pain is or why she is still in pain, she will need to either call her PCP or call back tomorrow when the provider she saw is working. Continue Ibuprofen for now.  Can add Tylenol in between. Rest with gentle stretching Heating pad. ER if severe pain, numbness, leg symptoms

## 2010-09-24 NOTE — Assessment & Plan Note (Signed)
Summary: FLU SHOT  Nurse Visit   Vitals Entered By: Kathlene November (June 26, 2007 4:28 PM)                 Prior Medications: CALCIUM-VITAMIN D 250-125 MG-UNIT TABS (CALCIUM CARBONATE-VITAMIN D)  MULTIVITAMINS  TABS (MULTIPLE VITAMIN) once daily SEA-OMEGA 1000 MG CAPS (OMEGA-3 FATTY ACIDS) ()  Current Allergies: No known allergies    Influenza Vaccine    Vaccine Type: Fluvax 3+    Site: left deltoid    Mfr: Novartis    Dose: 0.5 ml    Route: IM    Given by: Kathlene November    Exp. Date: 02/20/2008    Lot #: [id number]    VIS given: 02/19/05 version given June 26, 2007.  Flu Vaccine Consent Questions    Do you have a history of severe allergic reactions to this vaccine? no    Any prior history of allergic reactions to egg and/or gelatin? no    Do you have a sensitivity to the preservative Thimersol? no    Do you have a past history of Guillan-Barre Syndrome? no    Do you currently have an acute febrile illness? no    Have you ever had a severe reaction to latex? no    Vaccine information given and explained to patient? yes    Are you currently pregnant? no   Orders Added: 1)  Flu Vaccine 58yrs + [[id number]] 2)  Admin 1st Vaccine [email or web address]    ]

## 2010-09-24 NOTE — Consult Note (Signed)
Summary: [facility]   Imported By: Lanelle Bal 11/06/2009 11:54:05  _____________________________________________________________________  External Attachment:    Type:   Image     Comment:   External Document

## 2010-09-24 NOTE — Assessment & Plan Note (Signed)
Summary: urticaria   Vital Signs:  Patient Profile:   60 Years Old Female Height:     64 inches Weight:      155 pounds BMI:     26.70 Temp:     97.8 degrees F oral Pulse rate:   73 / minute BP sitting:   138 / 83  (left arm) Cuff size:   regular  Vitals Entered By: Harlene Salts (April 11, 2007 1:27 PM)               Visit Type:  acute PCP:  Nani Gasser MD  Chief Complaint:  RASH ALL OVER SINCE YESTERDAY.  History of Present Illness: 60 yo WF seen for itchy rash on abdomen started yesterday, spreading to extremities. Denies any new exposures or insect bites.  No fevers.  No sick contacts.  Swam on Saturday in pool.  No blisters,  non-painful.  Otherwise feels well.  Using Caladryl.  Current Allergies: No known allergies      Review of Systems      See HPI   Physical Exam  General:     alert, well-developed, well-nourished, and well-hydrated.   Skin:     raised erythematous urticarial type rash on L lower abdomen, lower back, arms and legs.  blanches.  Not well demarcated no mucosal or palmar involvment    Impression & Recommendations:  Problem # 1:  URTICARIA NOS (ICD-708.9) Unknown offending agent.  Treat itchy spreading rash with Depo Medrol 80 mg IM x 1 today.  Oatmeal baths and Caladryl for itching.  Call if not improving in 3 days. Orders: Depo- Medrol 80mg  (J1040)   Complete Medication List: 1)  Calcium-vitamin D 250-125 Mg-unit Tabs (Calcium carbonate-vitamin d) 2)  Multivitamins Tabs (Multiple vitamin) .... Once daily 3)  Sea-omega 1000 Mg Caps (omega-3 Fatty Acids)        Medication Administration  Injection # 1:    Medication: Depo- Medrol 80mg     Diagnosis: URTICARIA NOS (ICD-708.9)    Route: IM    Site: RUOQ gluteus    Exp Date: 05/23/2009    Lot #: [id number]    Mfr: PHARMACIA & UPJOHN    Patient tolerated injection without complications    Given by: Harlene Salts (April 11, 2007 3:43 PM)  Orders Added: 1)  Est.  Patient Level II [60454] 2)  Depo- Medrol 80mg  [J1040]

## 2010-09-24 NOTE — Progress Notes (Signed)
     Follow-up for Phone Call       Follow-up by: Avel Sensor     Appended Document:  Called patient advised her that Dr Orson Aloe was not comfortable prescribing anything for her since he did not see her, she said "well I was seen there and the other doctor would give me something."  So I advised her to call back tomorrow when Dr Cathren Harsh was here and she hung up.

## 2010-09-24 NOTE — Progress Notes (Signed)
Summary: Will set up Pelvic US  Phone Note Call from Patient Call back at Home Phone [phone]   Caller: Patient Call For: Nani Gasser MD Summary of Call: Pt calls and states is using the stool softner as you wanted her to but would like to have a sonogram done next week. Initial call taken by: Kathlene November,  April 03, 2008 1:03 PM

## 2010-09-24 NOTE — Progress Notes (Signed)
  Phone Note Call from Patient Call back at Home Phone [phone]   Caller: Patient Call For: Nani Gasser MD Summary of Call: Pt calls and said was upset about her cholesterol results. She thinks that her blood type which is AB is the cause of her cholesterol being high or is a contributing factor. She wants to know if there is a specialists she can be referred to that deals in blood types and that could help her with this. Please advise. Thanks Initial call taken by: Kathlene November,  February 02, 2007 9:08 AM  Follow-up for Phone Call        As far as I am aware no definite corelation to blood type, even if this link does exist, there is nothing we can do to modify her blood type, thus, the only current treatment is diet, exercise, and cholesterol lowering meds which she needs based on her numbers which have remained elevated.  She doesn't have to take cholesterol meds, but it is certainly my recommendation for her to reduce her risk of heart disease.  Follow-up by: Nani Gasser MD,  February 02, 2007 9:45 AM  Additional Follow-up for Phone Call Additional follow up Details #1::        Notified pt of your recommendation and she still wants the name of a MD that deals in blood types so she can discuss this with them. She also said she read in her book that a Naturopathic Physician deals with this also. Additional Follow-up by: Kathlene November,  February 02, 2007 10:31 AM   Additional Follow-up for Phone Call Additional follow up Details #2::    We can give her the name of The hematologists in [place], but I am not making a referral.  Also I don't know any NDs (naturopathy docs) in the areas.  Follow-up by: Nani Gasser MD,  February 02, 2007 1:57 PM  Additional Follow-up for Phone Call Additional follow up Details #3:: Details for Additional Follow-up Action Taken: Called pt gave name of 2 Hematologists-- Dr. Myna Hidalgo and Dr. Anne Fu for her to contact to ask about blood type and cholesterol. KJ  LPN @ 1:32GM Additional Follow-up by: Kathlene November,  February 02, 2007 4:21 PM

## 2010-09-24 NOTE — Progress Notes (Signed)
Summary: in alot of pain with bursitis  Phone Note Call from Patient Call back at Home Phone [phone]   Caller: Patient Call For: Gaylene Moylan Summary of Call: Pt had fall on May 24th and hurt knee- seen her ortho andput in light brace- ? if needs surgery but still alot of swelling and bruising. Is doing physical therapy for this. Now having alot of back pain- seen in UC and told has trochanteric bursitis and sacroiliac pain on physical xam. Taking Ibuprofen 800mg  every 8 hours and is not touching the pain. Scheduled to see chiropractor for tomorrow morning. ? if she can increase the ibuprofen until then or is there something else she can use uintil in the morning- alot of pain Initial call taken by: Kathlene November,  February 16, 2010 10:17 AM  Follow-up for Phone Call        we did not see her for this so I cannot call in pain meds and she is on the max dose of ibuprofen.  She can call her ortho for pain meds. Follow-up by: Seymour Bars DO,  February 16, 2010 10:36 AM     Appended Document: in alot of pain with bursitis 02/16/2010 @ 10:51am- Informed pt of MD instructions. Pt was not happy and stated that she already called UC and was told that Dr. Orson Aloe was not comfortable prescribing pain med when she did not see her and was told to call back tomorrow when Dr. Cathren Harsh was in the office. Pt was very unhappy about this. Instructed pt to try heating pad on area for then apply single ice cube to most tender points along with the Ibuprofen and hopefully that would help alleviate some of the pain until seen at her chiropractor tomorrow. Pt verbalized would try this and see. KJ LPN
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
## 2010-09-24 NOTE — Assessment & Plan Note (Signed)
Summary: LEG NUMBNESS   Vital Signs:  Patient Profile:   60 Years Old Female Height:     64 inches Weight:      155 pounds Pulse rate:   63 / minute BP sitting:   119 / 71  (left arm) Cuff size:   regular  Vitals Entered By: Kathlene November (May 24, 2007 10:10 AM)                 PCP:  Nani Gasser MD  Chief Complaint:  tingling in both legs and arms. Legs cramping alot. ? if needs referral to vein clinic or Neurologists..  History of Present Illness: Had vein stripped in left leg in 1981.  Legs hurst at night.  Relieved with elevation.  Wrose when stands along period of time.  Does alot of walking and prolonged sitting.  Has been waking up with cramping in lower legs near ankles bilat in the middle of the night.   Aweek ago stood at [employer] for 2 hours and next day had nubmness and tingling in her feet and arms.  Has had bilat carpel tunnel and has had surgery on both.  Tried Excedrin adn didn't help.  Tried Ibuprofen adn did help but upset her stomach.   Recently had thyroid checked and it was normal.   Current Allergies: No known allergies       Physical Exam  General:     Well-developed,well-nourished,in no acute distress; alert,appropriate and cooperative throughout examination Head:     Normocephalic and atraumatic without obvious abnormalities. No apparent alopecia or balding. Eyes:     No corneal or conjunctival inflammation noted. EOMI. Perrla.  Neck:     No deformities, masses, or tenderness noted.  No TM Lungs:     Normal respiratory effort, chest expands symmetrically. Lungs are clear to auscultation, no crackles or wheezes. Heart:     Normal rate and regular rhythm. S1 and S2 normal without gallop, murmur, click, rub or other extra sounds. Msk:     + tinels sign on both wrists with left greater than right. No swelling on LEs.  Normal ROM of knees, ankles and toes.  Monofilament normal in both feet. Nontender calves. Some pain behind left knee and  along superior medial ankle on right. Does have some varicose and spider veins. Old scar from vein stripping on the left.  Pulses:     Radial, DP, adn post tib 2+ bilat.  Extremities:     No clubbing, cyanosis, edema, or deformity noted with normal full range of motion of all joints.   Neurologic:     Reflexes 1+ in UE and LE, symmetric.  Skin:     no rashes.   Psych:     Cognition and judgment appear intact. Alert and cooperative with normal attention span and concentration. No apparent delusions, illusions, hallucinations    Impression & Recommendations:  Problem # 1:  PARESTHESIA (ICD-782.0) Will do screening labs to rule out metabolic causes, E45, folate, iron.  Has recently had normal thyroid.  Will get labs from [facility] before ordering.  Will set up for ABIx to rule out any claudication. If all normal then may need to see vein specialist again.  Unsure why numbness adn tingling.  Will follow.  Will add cholesterol screen to labs.   Complete Medication List: 1)  Calcium-vitamin D 250-125 Mg-unit Tabs (Calcium carbonate-vitamin d) 2)  Multivitamins Tabs (Multiple vitamin) .... Once daily 3)  Sea-omega 1000 Mg Caps (omega-3 Fatty  Acids)      ]  Appended Document: LEG NUMBNESS Call pt:  REviewed labs done at [facility]. Normal TSH, liver, and kidney.  Will check additional labs and set up for ABIs. Can fax order down for labs.  October  7, 20088:20 AM Linford Arnold MD, Santina Evans--- PATIENT INFORMED AND LAB ORDER FAXED DOWNSTAIRS.LM

## 2010-09-24 NOTE — Progress Notes (Signed)
Summary: Mammo and bone density orders  Phone Note Call from Patient Call back at Home Phone [phone]   Caller: Patient Call For: Nani Gasser MD Summary of Call: Pt needs order for mammo and bone density faxed to [facility] in [place]. Fax is [phone] Initial call taken by: Kathlene November,  March 11, 2009 3:41 PM

## 2010-09-24 NOTE — Progress Notes (Signed)
Summary: Derm Referral

## 2010-09-24 NOTE — Progress Notes (Signed)
Summary: Lovaza  Phone Note Call from Patient Summary of Call: PT CALLED, SHE WENT TO FILL LOVAZA AT [facility] AND THE COST WAS $167,  DOES THAT SOUND RIGHT? SHE WAS GETTING OMEGA III AT THE [facility] FOR LESS THAN $50...PLEASE CALL Initial call taken by: Gardiner Ramus,  August 01, 2006 11:36 AM    Follow-up for Phone Call Details for Follow-up Action Taken: Was that with insurance?  If her insurance doesn't cover it then that is about right for the price. Would benefit the most from Statin to lower her LDL.  There is a generic available for Zocor if she is OK to try that.  CM Follow-up by: Nani Gasser MD,  August 01, 2006 1:00 PM  Additional Follow-up for Phone Call Details for Additional Follow-up Action Taken: Patient said that she will continue taking the omega tablet and will do research about Zocor and let you know if she will take it and she will continue to looking for a cheaper omega at other drug store. Additional Follow-up Action Taken: Phone Call Completed Additional Follow-up by: Edmonia Lynch,  August 02, 2006 9:43 AM

## 2010-09-24 NOTE — Progress Notes (Signed)
Summary: LAST CPE/LUMP UNDERARM  Phone Note Call from Patient   Reason for Call: Talk to Nurse Summary of Call: PT WANTS TO KNOW WHEN WAS HER LAST CPE.  PT C/O LUMP UNDER HER ARM.  PLEASE ADVISE. Initial call taken by: TORI 01-24-07 9:00 AM  Follow-up for Phone Call        Informed pt of last physical 09/23/05. Will schedule CPE. KJ LPN @ 04:54UJ Follow-up by: Kathlene November,  January 24, 2007 10:10 AM

## 2010-09-24 NOTE — Assessment & Plan Note (Signed)
Summary: L Plantar fascitis   Vital Signs:  Patient profile:   60 year old female Height:      64 inches Weight:      156 pounds Pulse rate:   67 / minute BP sitting:   131 / 78  (left arm) Cuff size:   regular  Vitals Entered By: Kathlene November (April 22, 2009 1:34 PM) CC: left foot pain on the sole of the foot near the heel   Primary Care Provider:  Nani Gasser MD  CC:  left foot pain on the sole of the foot near the heel.  History of Present Illness: left foot pain started in mid-May. Then jot worse in June. Pain is now everyday. Painful when not on her feet.  Has noticed some swelling.  Occ takes an NSAID for pain relief. More painful when wears dress shoes. No alleviating sxs. Not icing. pain is cen tralized to the heel and doens't radiate or change locations. Constant pain, dull.  More painful when gets up on it first thing in the morning.   Current Medications (verified): 1)  Calcium-Vitamin D 250-125 Mg-Unit Tabs (Calcium Carbonate-Vitamin D) 2)  Multivitamins  Tabs (Multiple Vitamin) .... Once Daily 3)  Red Yeast Rice Extract 600 Mg  Caps (Red Yeast Rice Extract)  Allergies (verified): No Known Drug Allergies  Comments:  Nurse/Medical Assistant: The patient's medications and allergies were reviewed with the patient and were updated in the Medication and Allergy Lists. Kathlene November (April 22, 2009 1:35 PM)  Past History:  Past Surgical History: ACL repair-Right knee by Dr. Elliot Dally [facility])  R carpal tunnel syndrome sx-Dr. Manson Passey Tonsillectomy age 80 Laparoscopic tubal ligation 1980 2 surgeries for Ovarian Cysts 1988 Another cyst sx for 1990s. Bunions in 1994, right and left great toes Left Carpel tunnel Vein stripping 1981  Physical Exam  General:  Well-developed,well-nourished,in no acute distress; alert,appropriate and cooperative throughout examination Msk:  Left foot with no swelling. Normal ROM. Tender at the base of the calcaneus with pressure.  NO MT or ankel tenderness. No laxity. No rash. Does have a well healed incision over the great toe.    Impression & Recommendations:  Problem # 1:  PLANTAR FASCIITIS (ICD-728.71) Assessment [facility] based on hx and exam. Will get xray to rule out fracture or heels spur sinc ehas had pain for several months. Given H.O  no stretches and exercises to do. REcommend icey massage and continuing to wear good cushoined shoes. If not better in 2-3 weeks then refer to podiatry for splints and possible injectin for pain relief.  Can use an NSAID as needed for pain relief.  Orders: T-DG Foot Complete*L* ([id number])  Complete Medication List: 1)  Calcium-vitamin D 250-125 Mg-unit Tabs (Calcium carbonate-vitamin d) 2)  Multivitamins Tabs (Multiple vitamin) .... Once daily 3)  Red Yeast Rice Extract 600 Mg Caps (Red yeast rice extract)

## 2010-09-24 NOTE — Assessment & Plan Note (Signed)
Summary: skin lesion recurred   Vital Signs:  Patient Profile:   60 Years Old Female Height:     64 inches  Vitals Entered By: Edmonia Lynch (August 26, 2006 10:36 AM)             Last PAP Result Done PapHx  Done (09/23/2005 10:54:11 AM)      PCP:  Cipriano Bunker  Chief Complaint:  F/U bite on back of leg.  History of Present Illness: Skin lesion recurred.  Finished one week course of Elocon and it appeared to have resolved but now a week later it started looking red again. Ithcing when in shower.  Non painful and no drainage.  Please see prior note with Dr. Cathey Endow for further detail.    Prior Medications: CALCIUM-VITAMIN D 250-125 MG-UNIT TABS (CALCIUM CARBONATE-VITAMIN D)  MULTIVITAMINS  TABS (MULTIPLE VITAMIN) once daily LOVAZA 1 GM CAPS (OMEGA-3-ACID ETHYL ESTERS) 4 tabs by mouth daily ELOCON 0.1 % OINT (MOMETASONE FUROATE) apply at bedtime to skin x 7 days Current Allergies: CIPRO     Physical Exam  Skin:     Back of right leg.  approx 1cm nodule under skin but flat red macule over skin.       Impression & Recommendations:  Problem # 1:  SKIN LESION (ICD-709.9) Did fungal scraping to rule out Ringworm. will call with results on Monday.  Put nothing on it this weekend.

## 2010-09-24 NOTE — Progress Notes (Signed)
Summary: GI referral: Dr. Leone Payor  Phone Note Call from Patient Call back at Home Phone [phone]   Caller: Patient Call For: Nani Gasser MD Summary of Call: will use Dr. Leone Payor for colonoscopy. Will you put order in? Thanks. KJ LPN Initial call taken by: Kathlene November,  October 30, 2007 1:14 PM

## 2010-09-24 NOTE — Progress Notes (Signed)
Summary: MEDICATION  Phone Note Call from Patient Summary of Call: PATIENT STATES HER SPIDER BITE GOT BETTER BUT NOW IS BACK. CAN SHE USE THE SAME CREAM THAT DR BOWEN GAVE HER. I OFFERED HER AN APPT THIS MORNING BUT SHE REFUSED. Initial call taken by: Drinda Butts,  August 26, 2006 9:06 AM    Follow-up for Phone Call Details for Follow-up Action Taken: Yes can use same cream, if not resolving please come in.  Follow-up by: Nani Gasser MD,  August 26, 2006 9:39 AM

## 2010-09-24 NOTE — Assessment & Plan Note (Signed)
Summary: PAIN ON L SIDE   Vital Signs:  Patient Profile:   60 Years Old Female Height:     64 inches Weight:      156 pounds Pulse rate:   65 / minute BP sitting:   121 / 72  (left arm) Cuff size:   regular  Vitals Entered By: Kathlene November (March 14, 2008 10:45 AM)                 PCP:  Nani Gasser MD  Chief Complaint:  left sided lower abd. pain. Seen GYN had U/S- normal. Had colonoscopy which was normal. Pain still present as well as constipation.Marland Kitchen  History of Present Illness: Pain on the left side is still present. See prior notes. Had normal colonoscopy with Dr. Leone Payor.  Also had a gyn exam where the MD says he felt something in the left, but then on FU [place] they were unable to visualize her left ovary. She feels the pain is similar to prior pain had with ovarian cyst.   Does feel constipated, and battles with chronic constipation. Also some urinary pressure and frquency and feeling not voided.  Last night very painful.     Current Allergies: No known allergies   Past Medical History:    Reviewed history from 01/27/2007 and no changes required:       Boderline Osteopenia       G2 P2       Hx Ovarian cysts  Past Surgical History:    Reviewed history from 01/27/2007 and no changes required:       ACL repair-Right knee by Dr. Elliot Dally [facility])        R carpal tunnel syndrome sx-Dr. Manson Passey       Tonsillectomy age 60       Laparoscopic tubal ligation 1980       2 surgeries for Ovarian Cysts 1988       Another cyst sx for 1990s.       Bunions in 1994       Left Carpel tunnel       Vein stripping 1981      Physical Exam  General:     Well-developed,well-nourished,in no acute distress; alert,appropriate and cooperative throughout examination Head:     Normocephalic and atraumatic without obvious abnormalities. No apparent alopecia or balding. Abdomen:     Mild tender along inferior LLQ. Point tendernes.  Left hip with normal ROM and strength 5/5 in all  directions. soft, no distention, no masses, no guarding, no hepatomegaly, and no splenomegaly.   Normal BS.  Skin:     no rasheson abdomen.  Inguinal Nodes:     no L inguinal adenopathy.      Impression & Recommendations:  Problem # 1:  ABDOMINAL PAIN, LEFT LOWER QUADRANT (ICD-789.04) Recommend get the bowels moving (decrease stool in colon) and set up for [place] of left ovary. May still be ovarian cyst. If this is normal. Recommend get CT for further evauation. Consider adhesion.    Complete Medication List: 1)  Calcium-vitamin D 250-125 Mg-unit Tabs (Calcium carbonate-vitamin d) 2)  Multivitamins Tabs (Multiple vitamin) .... Once daily 3)  Red Yeast Rice Extract 600 Mg Caps (Red yeast rice extract)    ]
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
## 2010-09-24 NOTE — Progress Notes (Signed)
Summary: Wants rec for referral for Colonoscopy  Phone Note Call from Patient Call back at Home Phone [phone]   Caller: Patient Call For: Nani Gasser MD Summary of Call: Pt would like your references on where she should go in [place] and [place] to have her colonoscopy. Also states needs her liver function and cholesterol checked. Please call. KJ LPN Initial call taken by: Kathlene November,  October 30, 2007 10:50 AM  Follow-up for Phone Call        REally like Dr. Leone Payor at [facility] for colonoscopy.  In [place], primarily use [facility] and have gotten good feedback from patients about that practice.  Can fax labs slip to check  liver, etc.  Follow-up by: Nani Gasser MD,  October 30, 2007 12:54 PM  Additional Follow-up for Phone Call Additional follow up Details #1::        Will use Dr. Leone Payor. Faxed lab orders to [facility]. KJ LPN Additional Follow-up by: Kathlene November,  October 30, 2007 1:14 PM

## 2010-09-24 NOTE — Consult Note (Signed)
Summary: OB/GYN  OB/GYN   Imported By: Kathlene November 11/08/2007 10:44:21  _____________________________________________________________________  External Attachment:    Type:   Image     Comment:   External Document

## 2010-09-24 NOTE — Therapy (Signed)
Summary: Audiogram/[facility] Kathryne Sharper  Audiogram/ Kathryne Sharper   Imported By: Lanelle Bal 05/25/2010 11:03:14  _____________________________________________________________________  External Attachment:    Type:   Image     Comment:   External Document

## 2010-09-24 NOTE — Procedures (Signed)
Summary: Consultation   Consultation   Imported By: Kathlene November 11/08/2007 10:43:24  _____________________________________________________________________  External Attachment:    Type:   Image     Comment:   External Document

## 2010-09-24 NOTE — Assessment & Plan Note (Signed)
   Vital Signs:  Patient Profile:   60 Years Old Female Weight:      155.75 pounds Pulse rate:   71 / minute BP sitting:   117 / 76  (left arm)  Vitals Entered By: Edmonia Lynch (July 20, 2006 11:31 AM)       Last PAP Result Done PapHx  Done (09/23/2005 10:54:11 AM)      PCP:  Cipriano Bunker  Chief Complaint:  C/o Rt knee pain, stiff, swollen all in the knee area, and problem first time happening .  History of Present Illness: Rt knee pain for 2 weeks, started doing squats, no other trauma.  Feels stiff and locked all day.  No true locking.  Took Motrin and help with swelling.    DIfficulty emptying bladder with frequency.  Occ leaking with stress and occ urgency.    Review of Systems      See HPI   Impression & Recommendations:  Problem # 1:  KNEE PAIN, RIGHT (ICD-719.46) Motrin 3x day for 4-5 days.  Elevated and ice at night. Worse with activity.  Supsect cartilage tear.  Consider steroid injection or referrall if needed.   Problem # 2:  FREQUENCY, URINARY (ICD-788.41) Referall to Urology for incomplete emptying and frequency.   Orders: Urology Referral (Urology)   Problem # 3:  HYPERLIPIDEMIA (ICD-272.4) Discussed result.  Start med below and regular exercise.  Recheck in 2 months.   Her updated medication list for this problem includes:    Lovaza 1 Gm Caps (Omega-3-acid ethyl esters) .Marland KitchenMarland KitchenMarland KitchenMarland Kitchen 4 tabs by mouth daily   Medications Added to Medication List This Visit: 1)  Lovaza 1 Gm Caps (Omega-3-acid ethyl esters) .... 4 tabs by mouth daily  Patient Instructions: 1)  Please schedule a follow-up appointment in 2 weeks for f/u knee pain.        2)  Limit activity to comfort.  Apply ice to affected area for 20 minutes frequently for 2-3 days to minimize swelling. Take anti-inflammatory as directed-motrin 3x a day as needed for 4-5 days.  Appended Document:   [facility] at 1 o'clock and patient is informed of appt.      Last PAP Result Done PapHx  Done (09/23/2005 10:54:11 AM)

## 2011-02-10 ENCOUNTER — Encounter: Payer: Self-pay | Admitting: Family Medicine

## 2011-02-11 ENCOUNTER — Ambulatory Visit: Payer: Self-pay | Admitting: Family Medicine

## 2011-07-27 ENCOUNTER — Telehealth: Payer: Self-pay | Admitting: Family Medicine

## 2011-07-27 DIAGNOSIS — Z1231 Encounter for screening mammogram for malignant neoplasm of breast: Secondary | ICD-10-CM

## 2011-07-27 NOTE — Telephone Encounter (Signed)
Patient called request to know if she can get an order to have a mammogram sent downstairs to [facility]

## 2011-07-27 NOTE — Telephone Encounter (Signed)
Order place. They will call her to schedule.

## 2011-07-29 ENCOUNTER — Ambulatory Visit (INDEPENDENT_AMBULATORY_CARE_PROVIDER_SITE_OTHER): Payer: Self-pay | Admitting: Family Medicine

## 2011-07-29 DIAGNOSIS — Z23 Encounter for immunization: Secondary | ICD-10-CM

## 2011-07-29 NOTE — Progress Notes (Signed)
Here for flu shot

## 2011-08-02 ENCOUNTER — Encounter: Payer: Self-pay | Admitting: Family Medicine

## 2011-08-02 ENCOUNTER — Ambulatory Visit
Admission: RE | Admit: 2011-08-02 | Discharge: 2011-08-02 | Disposition: A | Discharge status: Home / Self Care | Payer: BC Managed Care – PPO | Source: Ambulatory Visit | Attending: Family Medicine | Admitting: Family Medicine

## 2011-08-02 ENCOUNTER — Ambulatory Visit (INDEPENDENT_AMBULATORY_CARE_PROVIDER_SITE_OTHER): Payer: BC Managed Care – PPO | Admitting: Family Medicine

## 2011-08-02 VITALS — BP 120/67 | HR 81 | Wt 160.0 lb

## 2011-08-02 DIAGNOSIS — R591 Generalized enlarged lymph nodes: Secondary | ICD-10-CM

## 2011-08-02 DIAGNOSIS — R599 Enlarged lymph nodes, unspecified: Secondary | ICD-10-CM

## 2011-08-02 DIAGNOSIS — M542 Cervicalgia: Secondary | ICD-10-CM

## 2011-08-02 LAB — CBC WITH DIFFERENTIAL/PLATELET
Basophils Absolute: 0 10*3/uL (ref 0.0–0.1)
Basophils Relative: 1 % (ref 0–1)
Eosinophils Absolute: 0.1 10*3/uL (ref 0.0–0.7)
Eosinophils Relative: 2 % (ref 0–5)
HCT: 44.2 % (ref 36.0–46.0)
Hemoglobin: 14.2 g/dL (ref 12.0–15.0)
Lymphocytes Relative: 55 % — ABNORMAL HIGH (ref 12–46)
Lymphs Abs: 3.5 10*3/uL (ref 0.7–4.0)
MCH: 30.4 pg (ref 26.0–34.0)
MCHC: 32.1 g/dL (ref 30.0–36.0)
MCV: 94.6 fL (ref 78.0–100.0)
Monocytes Absolute: 0.5 10*3/uL (ref 0.1–1.0)
Monocytes Relative: 7 % (ref 3–12)
Neutro Abs: 2.2 10*3/uL (ref 1.7–7.7)
Neutrophils Relative %: 35 % — ABNORMAL LOW (ref 43–77)
Platelets: 286 10*3/uL (ref 150–400)
RBC: 4.67 MIL/uL (ref 3.87–5.11)
RDW: 13.3 % (ref 11.5–15.5)
WBC: 6.4 10*3/uL (ref 4.0–10.5)

## 2011-08-02 MED ORDER — CYCLOBENZAPRINE HCL 5 MG PO TABS: 5.0000 mg | ORAL_TABLET | Freq: Every evening | ORAL | Status: AC | PRN

## 2011-08-02 NOTE — Progress Notes (Signed)
  Subjective:    Patient ID: Tiffany Bender, female    DOB: 1951-02-17, 60 y.o.   MRN: [id number]  HPI Had a bad fall about 1.5 years.  Went to a Chiropodist for her low back. He also adjusted her neck and has had left ear buzzing since them. Now having swelling on the left side of her neck.  When yawns it is worse.  Will get dizzy when that happens.  Dec ROM of the left of the neck.  She thinks her hearing is OK.  Tender to touch.  No fever. No dysphagia.    She is also concerned about her daughter who is also a patient here, Tiffany Bender. She wonders if she could participate in some type of anger management therapy group session. She has been to psychiatry at [facility] as well as seeing Dr. Christell Constant downstairs has been unhappy with the results. The only thing that seemed to help her with Abilify but it caused a 18 pound weight gain in 3 months. Which is pretty significant for her daughter and is a Advertising account planner. Her daughter also recently started school at [employer]. She is 54 years old. Mom wonders if there may be some resources in this area.  Review of Systems     Objective:   Physical Exam  Constitutional: She appears well-developed and well-nourished.  HENT:  Head: Normocephalic and atraumatic.  Right Ear: External ear normal.  Left Ear: External ear normal.       Left TM and canal are clear.   Eyes: Conjunctivae are normal. Pupils are equal, round, and reactive to light.  Neck: Neck supple. No thyromegaly present.       She is tender over the muscle is attached the base of the sternomastoid on the left, which attaches to the clavicle. She also has some tenderness near the left occiput. I do feel a very small pea-like lymph node along the mid lateral edge of the sternomastoid on the left. She has normal flexion and extension of her neck. Decreased rotation right and left but more so to the left. Normal sidebending though pain going to the left.  Psychiatric: She has a  normal mood and affect. Her behavior is normal.          Assessment & Plan:  Neck pain-I did ask her to keep an eye on the very small lymph node along the edge of the sternocleidomastoid. At this point it is very small and I don't see any significant erythema or irritation over the skin. I more concerned about the muscle tightness and spasm that she has. We'll do cervical films today to better evaluate if she has any significant arthritis or changes in her neck. I think that physical therapy would be the best option for her right now she says her co-pays are so high she will not be able to do that. Thus I gave her handout on neck strain to start doing some home exercises and we will try Flexeril at bedtime for the next 3 weeks. If she is not making any significant progress then consider further evaluation by sports medicine/ orthopedist for possible trigger point injections.  As far as her daughter is concerned, I will see him when I can find. I will look into some programs for possible anger management for adolescents.

## 2011-08-02 NOTE — Patient Instructions (Signed)
Try the muscle relaxer at bedtime.

## 2011-08-03 ENCOUNTER — Telehealth: Payer: Self-pay | Admitting: Family Medicine

## 2011-08-03 NOTE — Telephone Encounter (Signed)
Already done

## 2011-08-03 NOTE — Telephone Encounter (Signed)
Patient called left a message that she would like to get her X-ray and lab work results if she could get a call back

## 2011-08-19 ENCOUNTER — Ambulatory Visit (INDEPENDENT_AMBULATORY_CARE_PROVIDER_SITE_OTHER): Payer: BC Managed Care – PPO | Admitting: Family Medicine

## 2011-08-19 ENCOUNTER — Other Ambulatory Visit (HOSPITAL_COMMUNITY)
Admission: RE | Admit: 2011-08-19 | Discharge: 2011-08-19 | Disposition: A | Discharge status: Home / Self Care | Payer: BC Managed Care – PPO | Source: Ambulatory Visit | Attending: Family Medicine | Admitting: Family Medicine

## 2011-08-19 ENCOUNTER — Encounter: Payer: Self-pay | Admitting: Family Medicine

## 2011-08-19 VITALS — BP 116/77 | HR 82 | Ht 64.0 in | Wt 162.0 lb

## 2011-08-19 DIAGNOSIS — Z01419 Encounter for gynecological examination (general) (routine) without abnormal findings: Secondary | ICD-10-CM | POA: Insufficient documentation

## 2011-08-19 DIAGNOSIS — Z1159 Encounter for screening for other viral diseases: Secondary | ICD-10-CM | POA: Insufficient documentation

## 2011-08-19 DIAGNOSIS — H9319 Tinnitus, unspecified ear: Secondary | ICD-10-CM
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
## 2011-08-19 NOTE — Patient Instructions (Signed)
Start a regular exercise program and make sure you are eating a healthy diet Try to eat 4 servings of dairy a day or take a calcium supplement (500mg twice a day). Your vaccines are up to date.   

## 2011-08-20 ENCOUNTER — Telehealth: Payer: Self-pay | Admitting: *Deleted

## 2011-08-20 NOTE — Telephone Encounter (Signed)
You can give her the phone number for the vein and vascular clinic in [place].

## 2011-08-20 NOTE — Telephone Encounter (Signed)
Would like to know of any vein specialists that she can call

## 2011-08-20 NOTE — Telephone Encounter (Signed)
Left VM with clinic and phone number.  MA, LPN

## 2011-10-26 ENCOUNTER — Telehealth: Payer: Self-pay | Admitting: Family Medicine

## 2011-10-26 NOTE — Telephone Encounter (Signed)
Not from [place] [facility] says hey will contact you for further images of the left breast because of some assymetry seen on the left breast.

## 2011-10-27 NOTE — Telephone Encounter (Signed)
Left message with spouse and on cell to call back

## 2011-10-27 NOTE — Telephone Encounter (Signed)
Pt informed

## 2011-10-28 ENCOUNTER — Telehealth: Payer: Self-pay | Admitting: Family Medicine

## 2011-10-28 NOTE — Telephone Encounter (Signed)
Diagnostic mammo of the left was reassuring but they do want to repeat mammo in 6 mo.

## 2011-10-29 NOTE — Telephone Encounter (Signed)
Patient notified

## 2011-11-16 ENCOUNTER — Encounter: Payer: Self-pay | Admitting: Family Medicine

## 2012-03-23 ENCOUNTER — Ambulatory Visit (INDEPENDENT_AMBULATORY_CARE_PROVIDER_SITE_OTHER): Payer: BC Managed Care – PPO | Admitting: Family Medicine

## 2012-03-23 ENCOUNTER — Encounter: Payer: Self-pay | Admitting: Family Medicine

## 2012-03-23 VITALS — BP 124/71 | HR 75 | Ht 64.0 in | Wt 158.0 lb

## 2012-03-23 DIAGNOSIS — B852 Pediculosis, unspecified: Secondary | ICD-10-CM

## 2012-03-23 MED ORDER — IVERMECTIN 0.5 % EX LOTN: 1.0000 "application " | TOPICAL_LOTION | Freq: Once | CUTANEOUS | Status: DC

## 2012-03-23 NOTE — Progress Notes (Signed)
  Subjective:    Patient ID: Tiffany Bender, female    DOB: Feb 19, 1951, 61 y.o.   MRN: [id number]  HPI  a couple of days ago noticed scalp was itchey. Cut her hair yesterday and when they put rollers it it felt sticky and noticed white flakes falling out,  but didn't have any hairspray in.  Does have dogs but they are on flea and tick medicine.  No family members with lice. She is worried about lice.  Has noticed her dogs have been scratching themselves more. No fever or other sxs. No hx of dry scalp.  Has noticed her hair has been more brittle this year.  Has even changed hairdyes bc of that.    Review of Systems     Objective:   Physical Exam  Constitutional: She appears well-developed and well-nourished.  HENT:  Head: Normocephalic and atraumatic.       Nits on some of the hairshafts in her bangs.           Assessment & Plan:  Lice - confirmed under the microscope.  Given rx for [facility]. If too expensive and call the office and we can: Something different or she can use of the over-the-counter products. Discussed hygiene to reduce recurrent infection or spreading to the family members in the home. Handout given. Call if any problems.

## 2012-03-23 NOTE — Patient Instructions (Signed)
Head and Pubic Lice  Lice are tiny, light brown insects with claws on the ends of their legs. They are small parasites that live on the human body. Lice often make their home in your hair. They hatch from little round eggs (nits), which are attached to the base of hairs. They spread by:   Direct contact with an infested person.    Infested personal items such as combs, brushes, towels, clothing, pillow cases and sheets.   The parasite that causes your condition may also live in clothes which have been worn within the week before treatment. Therefore, it is necessary to wash your clothes, bed linens, towels, combs and brushes. Any woolens can be put in an air-tight plastic bag for one week. You need to use fresh clothes, towels and sheets after your treatment is completed. Re-treatment is usually not necessary if instructions are followed. If necessary, treatment may be repeated in 7 days. The entire family may require treatment. Sexual partners should be treated if the nits are present in the pubic area.  TREATMENT   Apply enough medicated shampoo or cream to wet hair and skin in and around the infected areas.    Work thoroughly into hair and leave in according to instructions.    Add a small amount of water until a good lather forms.    Rinse thoroughly.    Towel briskly.    When hair is dry, any remaining nits, cream or shampoo may be removed with a fine-tooth comb or tweezers. The nits resemble dandruff; however they are glued to the hair follicle and are difficult to brush out. Frequent fine combing and shampoos are necessary. A towel soaked in white vinegar and left on the hair for 2 hours will also help soften the glue which holds the nits on the hair.   Medicated shampoo or cream should not be used on children or pregnant women without a caregiver's prescription or instructions.  SEEK MEDICAL CARE IF:     You or your child develops sores that look infected.     The rash does not go away in one week.    The lice or nits return or persist in spite of treatment.   Document Released: 08/09/2005 Document Revised: 07/29/2011 Document Reviewed: 03/08/2007  [employer] Patient Information 2012 [employer].

## 2012-04-12 ENCOUNTER — Telehealth: Payer: Self-pay | Admitting: Family Medicine

## 2012-04-12 DIAGNOSIS — R928 Other abnormal and inconclusive findings on diagnostic imaging of breast: Secondary | ICD-10-CM

## 2012-04-12 NOTE — Telephone Encounter (Signed)
Patient walk-in received a letter from [facility] that her [facility] Mammogram  visit is due and she would like a order for another Diagnostic Breast Imaging sent to the same place that she went to before which is [facility]. Thanks

## 2012-04-12 NOTE — Telephone Encounter (Signed)
Sent order to [facility]

## 2012-05-01 ENCOUNTER — Telehealth: Payer: Self-pay | Admitting: Family Medicine

## 2012-05-01 NOTE — Telephone Encounter (Signed)
Patient notified and to follow up in 6 months for repeat

## 2012-05-01 NOTE — Telephone Encounter (Addendum)
Please call patient. Normal mammogram.  Repeat in 6 months in March

## 2012-05-19 ENCOUNTER — Encounter: Payer: Self-pay | Admitting: Family Medicine

## 2012-05-19 ENCOUNTER — Ambulatory Visit (INDEPENDENT_AMBULATORY_CARE_PROVIDER_SITE_OTHER): Payer: BC Managed Care – PPO | Admitting: Family Medicine

## 2012-05-19 VITALS — BP 151/93 | HR 98 | Temp 98.2°F | Wt 157.0 lb

## 2012-05-19 DIAGNOSIS — W5581XA Bitten by other mammals, initial encounter: Secondary | ICD-10-CM

## 2012-05-19 DIAGNOSIS — S51809A Unspecified open wound of unspecified forearm, initial encounter: Secondary | ICD-10-CM

## 2012-05-19 DIAGNOSIS — S51859A Open bite of unspecified forearm, initial encounter: Secondary | ICD-10-CM

## 2012-05-19 DIAGNOSIS — T148 Other injury of unspecified body region: Secondary | ICD-10-CM

## 2012-05-19 DIAGNOSIS — Z23 Encounter for immunization: Secondary | ICD-10-CM

## 2012-05-19 MED ORDER — CEFUROXIME AXETIL 500 MG PO TABS: 500.0000 mg | ORAL_TABLET | Freq: Two times a day (BID) | ORAL | Status: DC

## 2012-05-19 MED ORDER — CEFTRIAXONE SODIUM 1 G IJ SOLR
1.0000 g | Freq: Once | INTRAMUSCULAR | Status: AC
  Administered 2012-05-19: 1 g via INTRAMUSCULAR

## 2012-05-19 NOTE — Progress Notes (Signed)
  Subjective:    Patient ID: Tiffany Bender, female    DOB: May 30, 1951, 60 y.o.   MRN: [id number]  HPI Bit by her cat yestereday AM. Now has fever, chills, and redness on her right lower arm. Rabies vaccine is up to date on the cat. Did apply some neosporin.  She did ice it last night.  Reports that she had a fever to 102 home on her thermometer. Hand feels still. She brought in proof of the  Rabies vaccine. She has not had any neurologic symptoms.  She did take ibuprofen for the fever last night. She's not sure if it helped or not.   Review of Systems     Objective:   Physical Exam   Right forearm is swollen and red. Erythema 10 x 20 cm, with puncture wound in the middle. No active drianage. No sign of abscess.  Wrist with no erythema and normal ROM.      Assessment & Plan:  Cat bite- She is allergic tp PCN.  Will give Rocephin 1 g IM today. We'll start cefuroxime 500 mg twice a day x14 days. I explained her that if she feels like she's getting worse of erythema is spreading that she needs to go to the emergency department this weekend she may need IV antibiotics. She understands the importance of this. Can use ibuprofen for pain and fever relief since Tylenol makes her sleepy. She is afebrile here today.

## 2012-05-19 NOTE — Patient Instructions (Signed)
Animal Bite  An animal bite can result in a scratch on the skin, deep open cut, puncture of the skin, crush injury, or tearing away of the skin or a body part. Dogs are responsible for most animal bites. Children are bitten more often than adults. An animal bite can range from very mild to more serious. A small bite from your house pet is no cause for alarm. However, some animal bites can become infected or injure a bone or other tissue. You must seek medical care if:  · The skin is broken and bleeding does not slow down or stop after 15 minutes.  · The puncture is deep and difficult to clean (such as a cat bite).  · Pain, warmth, redness, or pus develops around the wound.  · The bite is from a stray animal or rodent. There may be a risk of rabies infection.  · The bite is from a snake, raccoon, skunk, fox, coyote, or bat. There may be a risk of rabies infection.  · The person bitten has a chronic illness such as diabetes, liver disease, or cancer, or the person takes medicine that lowers the immune system.  · There is concern about the location and severity of the bite.  It is important to clean and protect an animal bite wound right away to prevent infection. Follow these steps:  · Clean the wound with plenty of water and soap.  · Apply an antibiotic cream.  · Apply gentle pressure over the wound with a clean towel or gauze to slow or stop bleeding.  · Elevate the affected area above the heart to help stop any bleeding.  · Seek medical care. Getting medical care within 8 hours of the animal bite leads to the best possible outcome.  DIAGNOSIS   Your caregiver will most likely:  · Take a detailed history of the animal and the bite injury.  · Perform a wound exam.  · Take your medical history.  Blood tests or X-rays may be performed. Sometimes, infected bite wounds are cultured and sent to a lab to identify the infectious bacteria.   TREATMENT   Medical treatment will depend on the location and type of animal bite as  well as the patient's medical history. Treatment may include:  · Wound care, such as cleaning and flushing the wound with saline solution, bandaging, and elevating the affected area.  · Antibiotics.  · Tetanus immunization.  · Rabies immunization.  · Leaving the wound open to heal. This is often done with animal bites, due to the high risk of infection. However, in certain cases, wound closure with stitches, wound adhesive, skin adhesive strips, or staples may be used.   Infected bites that are left untreated may require intravenous (IV) antibiotics and surgical treatment in the hospital.  HOME CARE INSTRUCTIONS  · Follow your caregiver's instructions for wound care.  · Take all medicines as directed.  · If your caregiver prescribes antibiotics, take them as directed. Finish them even if you start to feel better.  · Follow up with your caregiver for further exams or immunizations as directed.  You may need a tetanus shot if:  · You cannot remember when you had your last tetanus shot.  · You have never had a tetanus shot.  · The injury broke your skin.  If you get a tetanus shot, your arm may swell, get red, and feel warm to the touch. This is common and not a problem. If you need a tetanus   shot and you choose not to have one, there is a rare chance of getting tetanus. Sickness from tetanus can be serious.  SEEK MEDICAL CARE IF:  · You notice warmth, redness, soreness, swelling, pus discharge, or a bad smell coming from the wound.  · You have a red line on the skin coming from the wound.  · You have a fever, chills, or a general ill feeling.  · You have nausea or vomiting.  · You have continued or worsening pain.  · You have trouble moving the injured part.  · You have other questions or concerns.  MAKE SURE YOU:  · Understand these instructions.  · Will watch your condition.  · Will get help right away if you are not doing well or get worse.  Document Released: 04/27/2011 Document Revised: 07/29/2011 Document  Reviewed: 04/27/2011  ExitCare® Patient Information ©2012 [employer].

## 2012-05-22 ENCOUNTER — Encounter: Payer: Self-pay | Admitting: Family Medicine

## 2012-05-22 ENCOUNTER — Ambulatory Visit (INDEPENDENT_AMBULATORY_CARE_PROVIDER_SITE_OTHER): Payer: BC Managed Care – PPO | Admitting: Family Medicine

## 2012-05-22 VITALS — BP 127/83 | HR 81 | Wt 156.0 lb

## 2012-05-22 DIAGNOSIS — L0390 Cellulitis, unspecified: Secondary | ICD-10-CM

## 2012-05-22 DIAGNOSIS — T148XXA Other injury of unspecified body region, initial encounter: Secondary | ICD-10-CM

## 2012-05-22 DIAGNOSIS — W5501XA Bitten by cat, initial encounter: Secondary | ICD-10-CM

## 2012-05-22 DIAGNOSIS — L0291 Cutaneous abscess, unspecified: Secondary | ICD-10-CM

## 2012-05-22 NOTE — Progress Notes (Signed)
  Subjective:    Patient ID: Tiffany Bender, female    DOB: 16-Jan-1951, 61 y.o.   MRN: [id number]  HPI Says does feel better and looks better. Last fever was yesterday AM. Noticed starting looking some better yesterday. Says tolerating ABX well.  Started some vitamin C adn she feels that has really helped.    Review of Systems     Objective:   Physical Exam    Area is 6x 8 cm, but the center is still indurated.  Still scab is stil in place. No active drianage.  Still very tender to touch.     Assessment & Plan:  Cellulitis - will continue ABX and can start warm compresses. F/U inb 4 days on Friday. If sig better can cancel f/u but if still erythematous then f/u before the weekend to make sure improving.

## 2012-06-08 ENCOUNTER — Encounter: Payer: Self-pay | Admitting: Family Medicine

## 2012-11-22 ENCOUNTER — Telehealth: Payer: Self-pay | Admitting: *Deleted

## 2012-11-22 NOTE — Telephone Encounter (Signed)
Called pt back she stated that for the past week she has exp some fluttering on her Right side in her breast area that comes and goes and only last a few seconds. She denies any SOB,CP, or pain and states that it stays on that side and is only noticeable during the hours she is awake. Pt has an appt on Friday to have annual mammogram done.    Spoke with Dr. Linford Arnold and she stated that pt should come in after she has had her mammogram done to discuss Called pt and scheduled her for 4.10.14 @ 11am.Elyzabeth Goatley, Viann Shove

## 2012-11-24 ENCOUNTER — Telehealth: Payer: Self-pay | Admitting: Family Medicine

## 2012-11-24 NOTE — Telephone Encounter (Signed)
Please call patient. Normal mammogram.  Repeat in 1 year.  

## 2012-11-24 NOTE — Telephone Encounter (Signed)
Called and informed her that her mammogram was nl to repeat in 1 yr.Loralee Pacas [place]

## 2012-11-29 ENCOUNTER — Ambulatory Visit: Payer: BC Managed Care – PPO | Admitting: Family Medicine

## 2012-12-13 ENCOUNTER — Encounter: Payer: BC Managed Care – PPO | Admitting: Family Medicine

## 2012-12-27 ENCOUNTER — Other Ambulatory Visit (HOSPITAL_COMMUNITY)
Admission: RE | Admit: 2012-12-27 | Discharge: 2012-12-27 | Disposition: A | Discharge status: Home / Self Care | Payer: BC Managed Care – PPO | Source: Ambulatory Visit | Attending: Family Medicine | Admitting: Family Medicine

## 2012-12-27 ENCOUNTER — Telehealth: Payer: Self-pay | Admitting: *Deleted

## 2012-12-27 ENCOUNTER — Ambulatory Visit (INDEPENDENT_AMBULATORY_CARE_PROVIDER_SITE_OTHER): Payer: BC Managed Care – PPO | Admitting: Family Medicine

## 2012-12-27 ENCOUNTER — Encounter: Payer: Self-pay | Admitting: Family Medicine

## 2012-12-27 VITALS — BP 130/78 | HR 62 | Ht 64.0 in | Wt 159.0 lb

## 2012-12-27 DIAGNOSIS — Z01419 Encounter for gynecological examination (general) (routine) without abnormal findings: Secondary | ICD-10-CM

## 2012-12-27 DIAGNOSIS — Z13820 Encounter for screening for osteoporosis: Secondary | ICD-10-CM

## 2012-12-27 DIAGNOSIS — Z124 Encounter for screening for malignant neoplasm of cervix: Secondary | ICD-10-CM

## 2012-12-27 LAB — LIPID PANEL
Cholesterol: 327 mg/dL — ABNORMAL HIGH (ref 0–200)
HDL: 59 mg/dL (ref >39)
LDL Cholesterol: 211 mg/dL — ABNORMAL HIGH (ref 0–99)
Total CHOL/HDL Ratio: 5.5 Ratio
Triglycerides: 286 mg/dL — ABNORMAL HIGH (ref <150)
VLDL: 57 mg/dL — ABNORMAL HIGH (ref 0–40)

## 2012-12-27 LAB — COMPLETE METABOLIC PANEL WITH GFR
ALT: 26 U/L (ref 0–35)
AST: 20 U/L (ref 0–37)
Albumin: 4.4 g/dL (ref 3.5–5.2)
Alkaline Phosphatase: 76 U/L (ref 39–117)
BUN: 11 mg/dL (ref 6–23)
CO2: 26 mEq/L (ref 19–32)
Calcium: 9.2 mg/dL (ref 8.4–10.5)
Chloride: 105 mEq/L (ref 96–112)
Creat: 0.85 mg/dL (ref 0.50–1.10)
GFR, Est African American: 87 mL/min
GFR, Est Non African American: 75 mL/min
Glucose, Bld: 80 mg/dL (ref 70–99)
Potassium: 4.1 mEq/L (ref 3.5–5.3)
Sodium: 141 mEq/L (ref 135–145)
Total Bilirubin: 0.6 mg/dL (ref 0.3–1.2)
Total Protein: 6.8 g/dL (ref 6.0–8.3)

## 2012-12-27 LAB — CBC
HCT: 39.8 % (ref 36.0–46.0)
Hemoglobin: 13.8 g/dL (ref 12.0–15.0)
MCH: 30.6 pg (ref 26.0–34.0)
MCHC: 34.7 g/dL (ref 30.0–36.0)
MCV: 88.2 fL (ref 78.0–100.0)
Platelets: 291 10*3/uL (ref 150–400)
RBC: 4.51 MIL/uL (ref 3.87–5.11)
RDW: 14.1 % (ref 11.5–15.5)
WBC: 8.2 10*3/uL (ref 4.0–10.5)

## 2012-12-27 NOTE — Telephone Encounter (Signed)
Pt would like to get a bone density done at [facility] in [place]. Same place she gets her mammograms.

## 2012-12-27 NOTE — Patient Instructions (Signed)
Keep up a regular exercise program and make sure you are eating a healthy diet Try to eat 4 servings of dairy a day, or if you are lactose intolerant take a calcium with vitamin D daily.  Your vaccines are up to date.   

## 2012-12-27 NOTE — Telephone Encounter (Signed)
OK to order DG bone.

## 2012-12-27 NOTE — Progress Notes (Signed)
Subjective:     Tiffany Bender is a 62 y.o. female and is here for a comprehensive physical exam. The patient reports a fluttering sensation in the right lateral breast. She says it will happen anytime. It feels a most like a vibration. No trauma or injury. No masses or lesions. She did call and we got her in for a diagnostic mammogram which was normal. It started about a month ago. No known triggers or alleviating factors.  History   Social History  . Marital Status: Married    Spouse Name: RV    Number of Children: 2  . Years of Education: N/A   Occupational History  . Homemaker     Social History Main Topics  . Smoking status: Former Smoker    Quit date: 08/24/1983  . Smokeless tobacco: Not on file  . Alcohol Use: Yes     Comment: socially  . Drug Use: No  . Sexually Active: Yes -- Female partner(s)     Comment: adopted, currently unemployed, college education, 2 children, caffeine 2-3 day, former huwsband abusinve   Other Topics Concern  . Not on file   Social History Narrative   No regular exercise. 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 She was adopted at age 40. She does drink coffee daily. She has 2 biological children and 2 adopted children.   Health Maintenance  Topic Date Due  . Influenza Vaccine  04/23/2013  . Mammogram  05/01/2014  . Pap Smear  08/18/2014  . Colonoscopy  11/21/2017  . Tetanus/tdap  05/19/2022    The following portions of the patient's history were reviewed and updated as appropriate: allergies, current medications, past family history, past medical history, past social history, past surgical history and problem list.  Review of Systems A comprehensive review of systems was negative.   Objective:    BP 130/78  Pulse 62  Ht 5\' 4"  (1.626 m)  Wt 159 lb (72.122 kg)  BMI 27.28 kg/m2 General appearance: alert, cooperative and appears stated age Head: Normocephalic, without obvious abnormality, atraumatic Eyes: conj clear,  EOMi, PEERLA Ears: normal TM's and external ear canals both ears Nose: Nares normal. Septum midline. Mucosa normal. No drainage or sinus tenderness. Throat: lips, mucosa, and tongue normal; teeth and gums normal Neck: no adenopathy, no carotid bruit, no JVD, supple, symmetrical, trachea midline and thyroid not enlarged, symmetric, no tenderness/mass/nodules Back: symmetric, no curvature. ROM normal. No CVA tenderness. Lungs: clear to auscultation bilaterally Breasts: normal appearance, no masses or tenderness Heart: regular rate and rhythm, S1, S2 normal, no murmur, click, rub or gallop Abdomen: soft, non-tender; bowel sounds normal; no masses,  no organomegaly Pelvic: cervix normal in appearance, external genitalia normal, no adnexal masses or tenderness, no cervical motion tenderness, rectovaginal septum normal, uterus normal size, shape, and consistency and vagina normal without discharge Extremities: extremities normal, atraumatic, no cyanosis or edema Pulses: 2+ and symmetric Skin: Skin color, texture, turgor normal. No rashes or lesions Lymph nodes: Cervical, supraclavicular, and axillary nodes normal. Neurologic: Grossly normal    Assessment:    Healthy female exam.      Plan:     See After Visit Summary for Counseling Recommendations  Keep up a regular exercise program and make sure you are eating a healthy diet Try to eat 4 servings of dairy a day, or if you are lactose intolerant take a calcium with vitamin D daily.  Your vaccines are up to date.  Due for screening labwork  including lipid panel. She does take yeast rice for cholesterol, as she is not interested in taking a statin..  Vibration sensation-unclear etiology. This is reported in the literature. She fortunately has had a normal mammogram which is very reassuring. I typically has in the past that these types of symptoms to resolve eventually on their own. Her breast exam was normal today I try to give her  reassurance.

## 2013-05-24 ENCOUNTER — Ambulatory Visit (INDEPENDENT_AMBULATORY_CARE_PROVIDER_SITE_OTHER): Payer: BC Managed Care – PPO | Admitting: Family Medicine

## 2013-05-24 ENCOUNTER — Encounter: Payer: Self-pay | Admitting: Family Medicine

## 2013-05-24 ENCOUNTER — Ambulatory Visit: Payer: BC Managed Care – PPO | Admitting: Sports Medicine

## 2013-05-24 VITALS — BP 118/84 | HR 71 | Temp 98.0°F | Wt 159.0 lb

## 2013-05-24 DIAGNOSIS — N390 Urinary tract infection, site not specified: Secondary | ICD-10-CM

## 2013-05-24 DIAGNOSIS — R319 Hematuria, unspecified: Secondary | ICD-10-CM

## 2013-05-24 DIAGNOSIS — R300 Dysuria: Secondary | ICD-10-CM

## 2013-05-24 LAB — POCT URINALYSIS DIPSTICK
Bilirubin, UA: NEGATIVE
Glucose, UA: NEGATIVE
Ketones, UA: NEGATIVE
Nitrite, UA: POSITIVE
Protein, UA: 30
Spec Grav, UA: 1.015
Urobilinogen, UA: 0.2
pH, UA: 7

## 2013-05-24 MED ORDER — CIPROFLOXACIN HCL 500 MG PO TABS: 500.0000 mg | ORAL_TABLET | Freq: Two times a day (BID) | ORAL | Status: AC

## 2013-05-24 NOTE — Progress Notes (Signed)
  Subjective:    Patient ID: Tiffany Bender, female    DOB: 1950/09/14, 62 y.o.   MRN: [id number]  HPI Dysuria x1 day. She felt dizzy and felt bad yesterday.  Had a low grade temp. She feels she's been very stressed. No new soaps lotions or perfumes. No hematuria.  No recent antibiotics. No back pain with up.    Review of Systems     Objective:   Physical Exam  Constitutional: She appears well-developed and well-nourished.  HENT:  Head: Normocephalic and atraumatic.  Musculoskeletal:  NO CVA tenderness  Neurological: She is alert.  Skin: Skin is warm and dry.  Psychiatric: She has a normal mood and affect. Her behavior is normal.          Assessment & Plan:  UTI-urinalysis is positive for UTI. Will treat with Cipro.  Call if not better in 4-5 days. Continue use Azo-Standard over-the-counter for symptomatic related needed. Make sure drinking plenty of fluids.

## 2013-05-24 NOTE — Patient Instructions (Signed)
Urinary Tract Infection  Urinary tract infections (UTIs) can develop anywhere along your urinary tract. Your urinary tract is your body's drainage system for removing wastes and extra water. Your urinary tract includes two kidneys, two ureters, a bladder, and a urethra. Your kidneys are a pair of bean-shaped organs. Each kidney is about the size of your fist. They are located below your ribs, one on each side of your spine.  CAUSES  Infections are caused by microbes, which are microscopic organisms, including fungi, viruses, and bacteria. These organisms are so small that they can only be seen through a microscope. Bacteria are the microbes that most commonly cause UTIs.  SYMPTOMS   Symptoms of UTIs may vary by age and gender of the patient and by the location of the infection. Symptoms in young women typically include a frequent and intense urge to urinate and a painful, burning feeling in the bladder or urethra during urination. Older women and men are more likely to be tired, shaky, and weak and have muscle aches and abdominal pain. A fever may mean the infection is in your kidneys. Other symptoms of a kidney infection include pain in your back or sides below the ribs, nausea, and vomiting.  DIAGNOSIS  To diagnose a UTI, your caregiver will ask you about your symptoms. Your caregiver also will ask to provide a urine sample. The urine sample will be tested for bacteria and white blood cells. White blood cells are made by your body to help fight infection.  TREATMENT   Typically, UTIs can be treated with medication. Because most UTIs are caused by a bacterial infection, they usually can be treated with the use of antibiotics. The choice of antibiotic and length of treatment depend on your symptoms and the type of bacteria causing your infection.  HOME CARE INSTRUCTIONS   If you were prescribed antibiotics, take them exactly as your caregiver instructs you. Finish the medication even if you feel better after you  have only taken some of the medication.   Drink enough water and fluids to keep your urine clear or pale yellow.   Avoid caffeine, tea, and carbonated beverages. They tend to irritate your bladder.   Empty your bladder often. Avoid holding urine for long periods of time.   Empty your bladder before and after sexual intercourse.   After a bowel movement, women should cleanse from front to back. Use each tissue only once.  SEEK MEDICAL CARE IF:    You have back pain.   You develop a fever.   Your symptoms do not begin to resolve within 3 days.  SEEK IMMEDIATE MEDICAL CARE IF:    You have severe back pain or lower abdominal pain.   You develop chills.   You have nausea or vomiting.   You have continued burning or discomfort with urination.  MAKE SURE YOU:    Understand these instructions.   Will watch your condition.   Will get help right away if you are not doing well or get worse.  Document Released: 05/19/2005 Document Revised: 02/08/2012 Document Reviewed: 09/17/2011  ExitCare Patient Information 2014 [employer].

## 2013-05-25 ENCOUNTER — Telehealth: Payer: Self-pay | Admitting: Family Medicine

## 2013-05-25 ENCOUNTER — Encounter: Payer: Self-pay | Admitting: Emergency Medicine

## 2013-05-25 ENCOUNTER — Emergency Department
Admission: EM | Admit: 2013-05-25 | Discharge: 2013-05-25 | Disposition: A | Discharge status: Home / Self Care | Payer: BC Managed Care – PPO | Source: Home / Self Care | Attending: Family Medicine | Admitting: Family Medicine

## 2013-05-25 DIAGNOSIS — T783XXA Angioneurotic edema, initial encounter: Secondary | ICD-10-CM

## 2013-05-25 MED ORDER — PREDNISONE 20 MG PO TABS: 20.0000 mg | ORAL_TABLET | Freq: Two times a day (BID) | ORAL | Status: DC

## 2013-05-25 NOTE — ED Notes (Signed)
Is currently being treated for UTI on Cipro x 1 day; still has urgency, and now has lower abdominal and suprapubic edema.

## 2013-05-25 NOTE — ED Provider Notes (Signed)
CSN: [id number]     Arrival date & time 05/25/13  1957 History   First MD Initiated Contact with Patient 05/25/13 2011     Chief Complaint  Patient presents with  . Groin Swelling  . Urinary Tract Infection      HPI Comments: Patient reports that she developed dysuria and low grade fever yesterday.  Her PCP prescribed Cipro 500mg , and patient has now had 3 doses.  Today, her urininary symptoms have essentially resolved, but she feels mild malaise without fever.  She has noted a vague sense of bloating and swelling in her lower abdomen, although there is no pain.  No nausea/vomiting.  No change in bowel movements.  She has also noted swelling in her pubic area and bilateral labia without pain, erythema, or rash.  No vaginal discharge.   Past surgical history includes appendectomy.  The history is provided by the patient.    Past Medical History  Diagnosis Date  . Osteopenia   . Other and unspecified ovarian cysts     history   Past Surgical History  Procedure Laterality Date  . Arthroscopic repair acl      RT knee  . Carpal tunnel release    . Tonsillectomy      right  . Tubal ligation    . Ovarian cyst removal  1988    left   . Bunionectomy  1994    bilateral  . Carpal tunnel release  2000    Left   . Vein ligation and stripping  1981   Family History  Problem Relation Age of Onset  . Adopted: Yes  . Alcohol abuse Father    History  Substance Use Topics  . Smoking status: Former Smoker    Quit date: 08/24/1983  . Smokeless tobacco: Not on file  . Alcohol Use: Yes     Comment: socially   OB History   Grav Para Term Preterm Abortions TAB SAB Ect Mult Living                 Review of Systems  Constitutional: Positive for fatigue. Negative for fever, chills, diaphoresis, activity change and appetite change.  HENT: Negative for congestion, sore throat, facial swelling, mouth sores, trouble swallowing, neck stiffness and sinus pressure.   Eyes: Negative.    Respiratory: Negative for cough, chest tightness, shortness of breath and wheezing.   Cardiovascular: Negative.   Gastrointestinal: Positive for abdominal distention. Negative for nausea, vomiting, abdominal pain, diarrhea and blood in stool.  Genitourinary: Negative for dysuria, urgency, frequency, hematuria, flank pain, vaginal discharge, difficulty urinating, genital sores, vaginal pain and pelvic pain.       Bilateral swelling of labia without pain or tenderness  Musculoskeletal: Negative.   Skin: Negative for rash.  Neurological: Negative.     Allergies  Amoxicillin and Statins  Home Medications   Current Outpatient Rx  Name  Route  Sig  Dispense  Refill  . calcium-vitamin D (OSCAL) 250-125 MG-UNIT per tablet   Oral   Take 1 tablet by mouth daily.           . cholecalciferol (VITAMIN D) 1000 UNITS tablet   Oral   Take 1,000 Units by mouth daily.           . ciprofloxacin (CIPRO) 500 MG tablet   Oral   Take 1 tablet (500 mg total) by mouth 2 (two) times daily.   10 tablet   0   . Multiple Vitamin (MULTIVITAMIN) tablet  Oral   Take 1 tablet by mouth daily.           . predniSONE (DELTASONE) 20 MG tablet   Oral   Take 1 tablet (20 mg total) by mouth 2 (two) times daily.   11 tablet   0   . Red Yeast Rice Extract 600 MG CAPS   Oral   Take by mouth.           . vitamin C (ASCORBIC ACID) 500 MG tablet   Oral   Take 500 mg by mouth daily.            BP 148/85  Pulse 72  Temp(Src) 97.6 F (36.4 C) (Oral)  Resp 16  Ht 5\' 4"  (1.626 m)  Wt 158 lb (71.668 kg)  BMI 27.11 kg/m2  SpO2 97% Physical Exam Nursing notes and Vital Signs reviewed. Appearance:  Patient appears healthy, stated age, and in no acute distress Head:  No facial swelling Eyes:  Pupils are equal, round, and reactive to light and accomodation.  Extraocular movement is intact.  Conjunctivae are not inflamed  Mouth:  No lesions noted; moist mucous membranes   Pharynx:  Normal Neck:   Supple.  No adenopathy Lungs:  Clear to auscultation.  Breath sounds are equal.  Heart:  Regular rate and rhythm without murmurs, rubs, or gallops.  Abdomen:  No distension.  Mild bilateral hypogastric tenderness without masses or hepatosplenomegaly.  Bowel sounds are present.  No CVA or flank tenderness.  Genitourinary:   Suprapubic area and bilateral labia mildly edematous without erythema, tenderness, warmth, rash, or lesions.  Extremities:  No edema.  No calf tenderness Skin:  No rash present.   ED Course  Procedures  none   Labs Reviewed -  Urine culture preliminary result:  >=100,000 col/mL gram negative rods     MDM   1. Angioedema, initial encounter; most likely adverse effect from Cipro.  Hopefully she has had adequate dose of Cipro to eradicate her UTI.    Begin prednisone burst/ Stop Cipro.  Begin an antihistamine such as zyrtec daily.  Continue increased fluid intake. If symptoms become significantly worse during the night or over the weekend, proceed to the local emergency room Followup with PCP in 3 days.  Will need repeat urinalysis and culture.    Lattie Haw, MD 05/26/13 [phone]

## 2013-05-26 ENCOUNTER — Telehealth: Payer: Self-pay | Admitting: Family Medicine

## 2013-05-26 LAB — URINE CULTURE: Colony Count: >=100000

## 2013-05-28 ENCOUNTER — Ambulatory Visit (INDEPENDENT_AMBULATORY_CARE_PROVIDER_SITE_OTHER): Payer: BC Managed Care – PPO | Admitting: Family Medicine

## 2013-05-28 ENCOUNTER — Encounter: Payer: Self-pay | Admitting: Family Medicine

## 2013-05-28 VITALS — BP 136/82 | Wt 158.0 lb

## 2013-05-28 DIAGNOSIS — R1900 Intra-abdominal and pelvic swelling, mass and lump, unspecified site: Secondary | ICD-10-CM

## 2013-05-28 DIAGNOSIS — N390 Urinary tract infection, site not specified: Secondary | ICD-10-CM

## 2013-05-28 DIAGNOSIS — R10814 Left lower quadrant abdominal tenderness: Secondary | ICD-10-CM

## 2013-05-28 MED ORDER — FLUCONAZOLE 150 MG PO TABS: 150.0000 mg | ORAL_TABLET | Freq: Once | ORAL | Status: DC

## 2013-05-28 MED ORDER — SULFAMETHOXAZOLE-TMP DS 800-160 MG PO TABS: 1.0000 | ORAL_TABLET | Freq: Two times a day (BID) | ORAL | Status: DC

## 2013-05-28 NOTE — Progress Notes (Signed)
  Subjective:    Patient ID: Tiffany Bender, female    DOB: 1951/01/14, 62 y.o.   MRN: [id number]  HPI Micah Flesher to UC over the weekend. Has been taking 50mg  of benadryl TID and started the prednisone BID.  Has more pain on the left side just above the goin crease. No itching or irritation. Hx of ovarian cyst on the left side ( has had 2 surgeries for this). She feels like her UTI is not completely resolved. Having some dysuria again.    Review of Systems     Objective:   Physical Exam  Constitutional: She is oriented to person, place, and time.  Genitourinary:  She has some mild swelling over the suprapubic area.  Tender in the LLQ towards the suprapubic area.  No palpable LN in the groin crease. No rashes.   Neurological: She is alert and oriented to person, place, and time.  Skin: Skin is warm and dry.  Psychiatric: She has a normal mood and affect. Her behavior is normal.          Assessment & Plan:  UTI -based on urine culture will start Bactrim. She has no known sulfa allergy. Also give her prescription for Diflucan as she says she oftentimes gets yeast infections with Bactrim. She can use that as needed.  Suprabic swelling. Unclear etiology. This is a very odd reaction to Cipro but at this point we have stopped the medication and added to her intolerance list. Continue the prednisone. If she's not better by Thursday of this week then she will call us back and we will schedule her for an ultrasound and white count.  LL pelvic pain - If not better by the end of the week will order Pelvic US and check CBC

## 2013-05-28 NOTE — Patient Instructions (Addendum)
Call me by Thursday if not significantly better. Will get an ultrasound and blood count

## 2013-05-29 ENCOUNTER — Telehealth: Payer: Self-pay | Admitting: *Deleted

## 2013-05-29 NOTE — Telephone Encounter (Signed)
Pt calls today & states that she had to go to the ED last night with fever & UTI.  She states they gave her an IV abx last night & she felt so much better.  She states today that her urine a orangey brown color.  She states that she is trying to drink lots of water.  Currently taking the bactrim, prednisone, 50mg  of benadryl TID.  ED dr told her to f/u with you this week.

## 2013-05-29 NOTE — Telephone Encounter (Signed)
Have her come in on Monday for  Urine test to see if infection has cleared. If still having fever then let me know.

## 2013-05-30 NOTE — Telephone Encounter (Signed)
Notified pt & she has an appt tomorrow.

## 2013-05-31 ENCOUNTER — Ambulatory Visit (INDEPENDENT_AMBULATORY_CARE_PROVIDER_SITE_OTHER): Payer: BC Managed Care – PPO | Admitting: Family Medicine

## 2013-05-31 ENCOUNTER — Encounter: Payer: Self-pay | Admitting: Family Medicine

## 2013-05-31 VITALS — BP 130/90 | Temp 98.2°F | Wt 157.0 lb

## 2013-05-31 DIAGNOSIS — N390 Urinary tract infection, site not specified: Secondary | ICD-10-CM

## 2013-05-31 DIAGNOSIS — N10 Acute tubulo-interstitial nephritis: Secondary | ICD-10-CM

## 2013-05-31 LAB — POCT URINALYSIS DIPSTICK
Bilirubin, UA: NEGATIVE
Blood, UA: NEGATIVE
Glucose, UA: NEGATIVE
Ketones, UA: NEGATIVE
Leukocytes, UA: NEGATIVE
Nitrite, UA: NEGATIVE
Protein, UA: NEGATIVE
Spec Grav, UA: 1.015
Urobilinogen, UA: 0.2
pH, UA: 6.5

## 2013-05-31 MED ORDER — CEFTRIAXONE SODIUM 1 G IJ SOLR
1.0000 g | Freq: Once | INTRAMUSCULAR | Status: AC
  Administered 2013-05-31: 1 g via INTRAMUSCULAR

## 2013-05-31 NOTE — Progress Notes (Addendum)
  Subjective:    Patient ID: Tiffany Bender, female    DOB: [birth year], 62 y.o.   MRN: [id number]  HPI Picked her daughter up at school and felt like she was going to black out and spiked a fever.  Had chills.  They did an ABX drip. She feels weak andshakey this AM. They gave her IV antibiotics, Rocephin.  She conitnue the bactrim. She felt her breasts were swollen last night.  Given prednisone 5mg  daily. Finished steroid last night. She also fell at her abdomen was more swollen yesterday but just a little bit better today. No diarrhea. She does feelt like she's doing a little bit the yeast infection.-had sentt over prescription for Diflucan but she had not picked it up yet.  Review of Systems     Objective:   Physical Exam  Constitutional: She is oriented to person, place, and time. She appears well-developed and well-nourished.  HENT:  Head: Normocephalic and atraumatic.  Cardiovascular: Normal rate and regular rhythm.   Pulmonary/Chest: Effort normal and breath sounds normal.  Abdominal: Soft. Bowel sounds are normal. She exhibits no distension and no mass. There is no tenderness. There is no rebound and no guarding.  Musculoskeletal:  Mild CVA tenderness on the left.   Neurological: She is alert and oriented to person, place, and time.  Skin: Skin is warm and dry.  Psychiatric: She has a normal mood and affect. Her behavior is normal.          Assessment & Plan:  Acute pyelonephritis - will give IM Rocephin, 1 g today. Followup tomorrow for nurse visit for repeat Rocephin injection before the weekend. Continue the Bactrim until unable to contact the hospital to get the results on her urine culture and blood cultures. We'll also get a copy of the CT results. Can use Tylenol or Motrin as needed for fever. Urinalysis is negative here today.  Possible yeast infection-go ahead and fill the Diflucan if needed.  Addendum-I. did get a copy of the CT scan performed at did not help. His  CT was essentially negative. She did have some colonic diverticulosis but no diverticulitis. We also requested the urine and blood cultures. As of today on 06/01/2013 there were still pending. Patient is scheduled to have a second Rocephin injection performed today before the weekend.

## 2013-06-01 ENCOUNTER — Encounter: Payer: Self-pay | Admitting: *Deleted

## 2013-06-01 ENCOUNTER — Ambulatory Visit (INDEPENDENT_AMBULATORY_CARE_PROVIDER_SITE_OTHER): Payer: BC Managed Care – PPO | Admitting: Family Medicine

## 2013-06-01 ENCOUNTER — Other Ambulatory Visit: Payer: Self-pay | Admitting: *Deleted

## 2013-06-01 VITALS — BP 128/78 | Temp 98.3°F

## 2013-06-01 DIAGNOSIS — R945 Abnormal results of liver function studies: Secondary | ICD-10-CM

## 2013-06-01 DIAGNOSIS — N10 Acute tubulo-interstitial nephritis: Secondary | ICD-10-CM

## 2013-06-01 MED ORDER — CEFTRIAXONE SODIUM 1 G IJ SOLR
1.0000 g | Freq: Once | INTRAMUSCULAR | Status: AC
  Administered 2013-06-01: 1 g via INTRAMUSCULAR

## 2013-06-01 NOTE — Progress Notes (Signed)
Pt given 1G of Rocephin in RUOQ. Pt tolerated well. Had pt to wait 15 mins prior to leaving to watch for adverse reaction to med.Heath Gold   We called the hospital for final results on the urine culture and blood cultures and they're still pending as of today, 06/01/2013. Nani Gasser, MD

## 2013-06-02 LAB — BASIC METABOLIC PANEL
BUN: 18 mg/dL (ref 6–23)
CO2: 27 mEq/L (ref 19–32)
Calcium: 9.6 mg/dL (ref 8.4–10.5)
Chloride: 101 mEq/L (ref 96–112)
Creat: 1.15 mg/dL — ABNORMAL HIGH (ref 0.50–1.10)
Glucose, Bld: 82 mg/dL (ref 70–99)
Potassium: 4.2 mEq/L (ref 3.5–5.3)
Sodium: 137 mEq/L (ref 135–145)

## 2013-06-04 ENCOUNTER — Other Ambulatory Visit: Payer: Self-pay | Admitting: *Deleted

## 2013-06-04 DIAGNOSIS — R945 Abnormal results of liver function studies: Secondary | ICD-10-CM

## 2013-06-05 ENCOUNTER — Telehealth: Payer: Self-pay | Admitting: *Deleted

## 2013-06-05 DIAGNOSIS — R945 Abnormal results of liver function studies: Secondary | ICD-10-CM

## 2013-06-05 NOTE — Telephone Encounter (Signed)
Returned pt's call. She wanted to know if Dr.Metheney has gotten her results back from [facility].  Did query and copied and pasted pt's results and placed up front for her to p/u.Tiffany Bender

## 2013-07-10 ENCOUNTER — Other Ambulatory Visit: Payer: Self-pay | Admitting: Family Medicine

## 2013-07-10 LAB — CBC WITH DIFFERENTIAL/PLATELET
Basophils Absolute: 0 10*3/uL (ref 0.0–0.1)
Basophils Relative: 0 % (ref 0–1)
Eosinophils Absolute: 0.1 10*3/uL (ref 0.0–0.7)
Eosinophils Relative: 1 % (ref 0–5)
HCT: 41 % (ref 36.0–46.0)
Hemoglobin: 14.5 g/dL (ref 12.0–15.0)
Lymphocytes Relative: 64 % — ABNORMAL HIGH (ref 12–46)
Lymphs Abs: 5.2 10*3/uL — ABNORMAL HIGH (ref 0.7–4.0)
MCH: 31.5 pg (ref 26.0–34.0)
MCHC: 35.4 g/dL (ref 30.0–36.0)
MCV: 88.9 fL (ref 78.0–100.0)
Monocytes Absolute: 0.5 10*3/uL (ref 0.1–1.0)
Monocytes Relative: 6 % (ref 3–12)
Neutro Abs: 2.3 10*3/uL (ref 1.7–7.7)
Neutrophils Relative %: 29 % — ABNORMAL LOW (ref 43–77)
Platelets: 315 10*3/uL (ref 150–400)
RBC: 4.61 MIL/uL (ref 3.87–5.11)
RDW: 13.5 % (ref 11.5–15.5)
WBC: 8.1 10*3/uL (ref 4.0–10.5)

## 2013-07-10 LAB — BASIC METABOLIC PANEL
BUN: 10 mg/dL (ref 6–23)
CO2: 24 mEq/L (ref 19–32)
Calcium: 9.7 mg/dL (ref 8.4–10.5)
Chloride: 104 mEq/L (ref 96–112)
Creat: 0.78 mg/dL (ref 0.50–1.10)
Glucose, Bld: 78 mg/dL (ref 70–99)
Potassium: 4.6 mEq/L (ref 3.5–5.3)
Sodium: 142 mEq/L (ref 135–145)

## 2013-07-11 ENCOUNTER — Other Ambulatory Visit: Payer: Self-pay | Admitting: Family Medicine

## 2013-07-11 ENCOUNTER — Telehealth: Payer: Self-pay | Admitting: *Deleted

## 2013-07-11 DIAGNOSIS — R748 Abnormal levels of other serum enzymes: Secondary | ICD-10-CM

## 2013-07-11 LAB — HEPATIC FUNCTION PANEL
ALT: 46 U/L — ABNORMAL HIGH (ref 0–35)
AST: 27 U/L (ref 0–37)
Albumin: 4.5 g/dL (ref 3.5–5.2)
Alkaline Phosphatase: 93 U/L (ref 39–117)
Bilirubin, Direct: 0.1 mg/dL (ref 0.0–0.3)
Indirect Bilirubin: 0.5 mg/dL (ref 0.0–0.9)
Total Bilirubin: 0.6 mg/dL (ref 0.3–1.2)
Total Protein: 6.6 g/dL (ref 6.0–8.3)

## 2013-07-11 NOTE — Telephone Encounter (Signed)
Spoke w/phyllis at [facility] asked her to add on hepatic liver function to [id number].Laureen Ochs, Viann Shove

## 2013-07-11 NOTE — Telephone Encounter (Signed)
Spoke with [facility] and had them add on hepatic liver func to

## 2013-07-12 ENCOUNTER — Telehealth: Payer: Self-pay | Admitting: *Deleted

## 2013-07-12 NOTE — Telephone Encounter (Signed)
Pt called and lvm indicating that she would like to speak "directly" to Dr. Linford Arnold .Loralee Pacas [place]

## 2013-07-13 ENCOUNTER — Other Ambulatory Visit: Payer: BC Managed Care – PPO

## 2013-07-16 ENCOUNTER — Other Ambulatory Visit: Payer: Self-pay | Admitting: Family Medicine

## 2013-07-16 ENCOUNTER — Ambulatory Visit (INDEPENDENT_AMBULATORY_CARE_PROVIDER_SITE_OTHER): Payer: BC Managed Care – PPO

## 2013-07-16 DIAGNOSIS — R748 Abnormal levels of other serum enzymes: Secondary | ICD-10-CM

## 2013-07-16 DIAGNOSIS — R7989 Other specified abnormal findings of blood chemistry: Secondary | ICD-10-CM

## 2013-07-16 NOTE — Telephone Encounter (Signed)
Called.  See note on [place] result.

## 2013-07-17 ENCOUNTER — Encounter: Payer: Self-pay | Admitting: *Deleted

## 2013-07-17 ENCOUNTER — Ambulatory Visit (INDEPENDENT_AMBULATORY_CARE_PROVIDER_SITE_OTHER): Payer: BC Managed Care – PPO | Admitting: Family Medicine

## 2013-07-17 VITALS — BP 122/86

## 2013-07-17 DIAGNOSIS — R300 Dysuria: Secondary | ICD-10-CM

## 2013-07-17 LAB — POCT URINALYSIS DIPSTICK
Bilirubin, UA: NEGATIVE
Blood, UA: NEGATIVE
Glucose, UA: NEGATIVE
Ketones, UA: NEGATIVE
Leukocytes, UA: NEGATIVE
Nitrite, UA: NEGATIVE
Protein, UA: NEGATIVE
Spec Grav, UA: 1.02
Urobilinogen, UA: 0.2
pH, UA: 7

## 2013-07-17 NOTE — Progress Notes (Signed)
  Subjective:    Patient ID: Tiffany Bender, female    DOB: [birth year], 62 y.o.   MRN: [id number]  HPI Dysuria for several days. No fever chills or sweats. No hematuria. She did have a UTI about a month ago and so wanted to be rechecked before the holidays.   Review of Systems     Objective:   Physical Exam        Assessment & Plan:  Dysuria-urinalysis is negative for any sign of infection. Will send for culture for confirmation. Call back if symptoms get worse. Nani Gasser, MD

## 2013-07-17 NOTE — Progress Notes (Signed)
  Subjective:    Patient ID: Tiffany Bender, female    DOB: 09-03-1950, 62 y.o.   MRN: [id number] Pt here for [facility] nurse visit.  Donne Anon, CMA HPI    Review of Systems     Objective:   Physical Exam        Assessment & Plan:

## 2013-07-26 LAB — URINE CULTURE: Colony Count: 15000

## 2013-08-09 LAB — HEPATIC FUNCTION PANEL
ALT: 61 U/L — ABNORMAL HIGH (ref 0–35)
AST: 29 U/L (ref 0–37)
Albumin: 4.2 g/dL (ref 3.5–5.2)
Alkaline Phosphatase: 78 U/L (ref 39–117)
Bilirubin, Direct: 0.1 mg/dL (ref 0.0–0.3)
Indirect Bilirubin: 0.5 mg/dL (ref 0.0–0.9)
Total Bilirubin: 0.6 mg/dL (ref 0.3–1.2)
Total Protein: 6.3 g/dL (ref 6.0–8.3)

## 2013-08-10 ENCOUNTER — Telehealth: Payer: Self-pay | Admitting: Family Medicine

## 2013-08-10 ENCOUNTER — Other Ambulatory Visit: Payer: Self-pay | Admitting: Family Medicine

## 2013-08-10 DIAGNOSIS — D72820 Lymphocytosis (symptomatic): Secondary | ICD-10-CM

## 2013-08-10 DIAGNOSIS — R7401 Elevation of levels of liver transaminase levels: Secondary | ICD-10-CM

## 2013-08-10 DIAGNOSIS — Z13820 Encounter for screening for osteoporosis: Secondary | ICD-10-CM

## 2013-08-10 DIAGNOSIS — R748 Abnormal levels of other serum enzymes: Secondary | ICD-10-CM

## 2013-08-10 NOTE — Telephone Encounter (Signed)
Called and spoke with patient about her lab results. Splint her thumb not really sure why her liver enzymes are elevated. In the past they have been elevated on multiple occasions but typically she was taking an antibiotic or something else was going on that she thought may have triggered it and that typically always came back down to the normal range. At this point I do think it warrants further workup. We will check for hepatitis, Wilson's disease, do an ANA for any type of autoimmune disorders also sedimentation rate. She has no early lung disease but we'll go ahead and check for alpha-1 antitrypsin deficiency. She was adopted so really does not know her medical history very well. And she denies ever having had a blood transfusion.

## 2013-08-10 NOTE — Addendum Note (Signed)
Addended by: Nani Gasser D on: 08/10/2013 12:50 PM   Modules accepted: Orders

## 2013-08-10 NOTE — Telephone Encounter (Signed)
Pt would like Dr. to give her a quick call if possible

## 2013-08-10 NOTE — Telephone Encounter (Signed)
Patient advised.

## 2013-08-10 NOTE — Telephone Encounter (Signed)
Pt had earlier sad she had a couple of glasses of wine the weekend before.  I am not sure why it went up. That is why I want to get additional labs.  Also make sure eating low fat diet.  That can bump liver enzymes as well.

## 2013-08-10 NOTE — Telephone Encounter (Signed)
Pt has asked that Dr give her quick call today to talk further about her results even after she was told she would need to make an appointment.

## 2013-08-10 NOTE — Telephone Encounter (Signed)
Message copied by [employer]ito Games on Fri Aug 10, 2013 12:50 PM ------      Message from: Deno Etienne      Created: Fri Aug 10, 2013  8:35 AM       [phone] (M) pt informed she stated that she had a couple of glasses of wine the weekend before having this done she would like to know what could be causing the elevations. I informed her that it could be due to the alcohol consumption or if she used any products containing tylenol. She is requesting that Dr. Linford Arnold or any other Dr call her back to discuss this with her briefly I informed her that I could not guarantee that she would receive a return call from her or any provider due to their schedules but I would document her request. Pt voiced understanding.Loralee Pacas [place]             [phone] (H) ------

## 2013-08-10 NOTE — Telephone Encounter (Signed)
Patient is concerned about her elevated ALT. She states she does not take tylenol or drink. What should she do different. She has a hepatic function test on Monday. She wants to know why her ALT has increased. She has increased her water intake and change to decaffeinated soda.

## 2013-08-13 ENCOUNTER — Other Ambulatory Visit: Payer: Self-pay | Admitting: Family Medicine

## 2013-08-14 LAB — CBC WITH DIFFERENTIAL/PLATELET
Basophils Absolute: 0 10*3/uL (ref 0.0–0.1)
Basophils Relative: 1 % (ref 0–1)
Eosinophils Absolute: 0.1 10*3/uL (ref 0.0–0.7)
Eosinophils Relative: 2 % (ref 0–5)
HCT: 38.3 % (ref 36.0–46.0)
Hemoglobin: 13.2 g/dL (ref 12.0–15.0)
Lymphocytes Relative: 63 % — ABNORMAL HIGH (ref 12–46)
Lymphs Abs: 4.6 10*3/uL — ABNORMAL HIGH (ref 0.7–4.0)
MCH: 30.6 pg (ref 26.0–34.0)
MCHC: 34.5 g/dL (ref 30.0–36.0)
MCV: 88.9 fL (ref 78.0–100.0)
Monocytes Absolute: 0.6 10*3/uL (ref 0.1–1.0)
Monocytes Relative: 8 % (ref 3–12)
Neutro Abs: 2 10*3/uL (ref 1.7–7.7)
Neutrophils Relative %: 26 % — ABNORMAL LOW (ref 43–77)
Platelets: 282 10*3/uL (ref 150–400)
RBC: 4.31 MIL/uL (ref 3.87–5.11)
RDW: 14 % (ref 11.5–15.5)
WBC: 7.3 10*3/uL (ref 4.0–10.5)

## 2013-08-14 LAB — ANA: Anti Nuclear Antibody(ANA): NEGATIVE

## 2013-08-14 LAB — LACTATE DEHYDROGENASE: LDH: 204 U/L (ref 94–250)

## 2013-08-14 LAB — ACUTE HEP PANEL AND HEP B SURFACE AB
HCV Ab: NEGATIVE
Hep A IgM: NONREACTIVE
Hep B C IgM: NONREACTIVE
Hep B S Ab: NEGATIVE
Hepatitis B Surface Ag: NEGATIVE

## 2013-08-14 LAB — HEPATITIS PANEL, ACUTE
HCV Ab: NEGATIVE
Hep A IgM: NONREACTIVE
Hep B C IgM: NONREACTIVE
Hepatitis B Surface Ag: NEGATIVE

## 2013-08-14 LAB — PATHOLOGIST SMEAR REVIEW

## 2013-08-14 LAB — SEDIMENTATION RATE: Sed Rate: 1 mm/hr (ref 0–22)

## 2013-08-15 LAB — ALPHA-1-ANTITRYPSIN: A-1 Antitrypsin, Ser: 127 mg/dL (ref 90–200)

## 2013-08-15 LAB — CERULOPLASMIN: Ceruloplasmin: 34 mg/dL (ref 20–60)

## 2013-08-31 ENCOUNTER — Ambulatory Visit: Payer: BC Managed Care – PPO | Admitting: Psychology

## 2013-09-07 ENCOUNTER — Ambulatory Visit (INDEPENDENT_AMBULATORY_CARE_PROVIDER_SITE_OTHER): Payer: BC Managed Care – PPO | Admitting: Psychology

## 2013-09-07 DIAGNOSIS — F4323 Adjustment disorder with mixed anxiety and depressed mood: Secondary | ICD-10-CM

## 2013-09-19 ENCOUNTER — Telehealth: Payer: Self-pay | Admitting: *Deleted

## 2013-09-19 MED ORDER — IVERMECTIN 0.5 % EX LOTN: 1.0000 "application " | TOPICAL_LOTION | Freq: Once | CUTANEOUS | Status: DC

## 2013-09-19 NOTE — Telephone Encounter (Signed)
Pt called and stated that she has head lice and would like an rx to be sent to the pharmacy. I advised her to use the OTC for this she stated that the comb damaged her hair and that is why she would like for dr. Madilyn Fireman to call something in.Audelia Hives [place]

## 2013-10-03 ENCOUNTER — Ambulatory Visit: Payer: BC Managed Care – PPO | Admitting: Psychology

## 2013-10-26 ENCOUNTER — Encounter: Payer: Self-pay | Admitting: Family Medicine

## 2013-10-26 ENCOUNTER — Ambulatory Visit (INDEPENDENT_AMBULATORY_CARE_PROVIDER_SITE_OTHER): Payer: BC Managed Care – PPO | Admitting: Family Medicine

## 2013-10-26 VITALS — BP 118/76 | HR 72 | Wt 158.0 lb

## 2013-10-26 DIAGNOSIS — M542 Cervicalgia: Secondary | ICD-10-CM

## 2013-10-26 DIAGNOSIS — M25512 Pain in left shoulder: Secondary | ICD-10-CM

## 2013-10-26 DIAGNOSIS — M25519 Pain in unspecified shoulder: Secondary | ICD-10-CM

## 2013-10-26 DIAGNOSIS — R945 Abnormal results of liver function studies: Secondary | ICD-10-CM

## 2013-10-26 NOTE — Progress Notes (Signed)
   Subjective:    Patient ID: Tiffany Bender, female    DOB: [birth year], 63 y.o.   MRN: [id number]  HPI Lefts shoulder pain  For 3 months. Thought initially it was from the weight of her purse so changed purses and not improved.  Says her pain level is 2 today. Occ will notice a bruise.  Has noticed an indentation on the top of shoulder and has tried loosened her bra strap.  No change in ROM.  Has tried NSAID with no relief.  No old injries to the shoulder. Has been using Aleve-helps slightly. She says ibuprofen doesn't seem to work at all. She has been mostly using ice on it, with some relief.  No radiation of pain into the arm or hand.    Review of Systems     Objective:   Physical Exam  Constitutional: She is oriented to person, place, and time. She appears well-developed and well-nourished.  HENT:  Head: Normocephalic and atraumatic.  Eyes: Conjunctivae are normal. Pupils are equal, round, and reactive to light.  Neck: Neck supple.  Musculoskeletal:  Neck with normal flexion, extension. Dec rotation on the left, compared to the right.  Dec side bending on the left compared to the right. Shoulders with normal range of motion and strength in all directions. No laxity. She is tender just above the edge of the scapula and along the posterior border of the acromion. This is primarily in the region of the supraspinatous muscle and some tenderness going towards the neck over the trapezius.  Neurological: She is alert and oriented to person, place, and time.  Skin: Skin is warm and dry.  Psychiatric: She has a normal mood and affect. Her behavior is normal.          Assessment & Plan:  Left shoulder pain-  Continue Aleve 4 and temperature effect. Make sure take with food and water and stop immediately if any GI upset or irritation. Since she's had pain and swelling for 2-3 months this point time recommend applying heat. She does have a right PAC at home encouraged her to use this. We'll  also provide handout with general range of motion stretches for her shoulder to help with pain that she's having directly over the supraspinatous as well as over the trapezius. If she's not improving in the next 2-3 weeks I encouraged her call me back. She has not had any trauma or injury to recommend an x-ray at this point in time. Avoid direct pressure on the shoulder such as a heavy purse etc.  Abnormal liver enzymes-will repeat today.

## 2013-11-01 ENCOUNTER — Ambulatory Visit: Payer: BC Managed Care – PPO | Admitting: Family Medicine

## 2013-11-01 DIAGNOSIS — Z0289 Encounter for other administrative examinations: Secondary | ICD-10-CM

## 2013-11-01 LAB — HEPATIC FUNCTION PANEL
ALT: 32 U/L (ref 0–35)
AST: 21 U/L (ref 0–37)
Albumin: 4.3 g/dL (ref 3.5–5.2)
Alkaline Phosphatase: 70 U/L (ref 39–117)
Bilirubin, Direct: 0.1 mg/dL (ref 0.0–0.3)
Indirect Bilirubin: 0.4 mg/dL (ref 0.2–1.2)
Total Bilirubin: 0.5 mg/dL (ref 0.2–1.2)
Total Protein: 6.4 g/dL (ref 6.0–8.3)

## 2013-12-12 ENCOUNTER — Telehealth: Payer: Self-pay | Admitting: *Deleted

## 2013-12-12 DIAGNOSIS — I83893 Varicose veins of bilateral lower extremities with other complications: Secondary | ICD-10-CM

## 2013-12-12 NOTE — Telephone Encounter (Signed)
Would like referral to vascular [facility] vein [place].Tiffany Bender

## 2013-12-17 ENCOUNTER — Other Ambulatory Visit: Payer: Self-pay | Admitting: *Deleted

## 2013-12-17 DIAGNOSIS — R609 Edema, unspecified: Secondary | ICD-10-CM

## 2013-12-21 ENCOUNTER — Ambulatory Visit (INDEPENDENT_AMBULATORY_CARE_PROVIDER_SITE_OTHER): Payer: BC Managed Care – PPO | Admitting: Family Medicine

## 2013-12-21 ENCOUNTER — Encounter: Payer: Self-pay | Admitting: Family Medicine

## 2013-12-21 VITALS — BP 124/76 | Wt 159.0 lb

## 2013-12-21 DIAGNOSIS — I8390 Asymptomatic varicose veins of unspecified lower extremity: Secondary | ICD-10-CM

## 2013-12-21 NOTE — Progress Notes (Signed)
   Subjective:    Patient ID: Oneita Hurt, female    DOB: 07-31-1951, 63 y.o.   MRN: [id number]  HPI Here today to discuss concerns about varicose veins. She has one on the top of her foot which makes it difficult to wear her tissues. She also has one on her leg. She plans on traveling to [place] next week and is worried about possible blood clots. Gets a lot of pain and discomfort with her veins. She actually had vein stripping done in this 30 years ago by Dr. Doren Custard in [place]. She's been on her feet a lot lately and has been getting worse. She's also noticed a little bit of swelling in the calves and feet as well. She did try compression stockings in the past and says they're very uncomfortable. She's not sure if they were moderate or high-grade.  Review of Systems     Objective:   Physical Exam  Constitutional: She appears well-developed and well-nourished.  HENT:  Head: Normocephalic and atraumatic.  Skin: Skin is warm and dry.  She has a cluster of very superficial spider and varicose veins on her right upper thigh and around her left ankle. She has a large compressible vein the top of the left foot as well as a large compressible vein on the right anterior shin. No sign of erythema increased warmth or superficial venous thrombosis.  Psychiatric: She has a normal mood and affect. Her behavior is normal.          Assessment & Plan:  Varicose veins-think the best treatment at this point would be to get her medical grade compression stockings. It sounds like she's tried some moderate or high grade stockings in the past and they were very uncomfortable and painful. She might tolerate a light compression and it would at least be helpful with some of the pain and fatigue that she's experiencing her legs. We will call Dr. Mee Hives office who she has an appointment with in June, and find out if this could still be beneficial, especially since insurance companies usually require a trial.  With compression stockings before they will cover any procedural treatment for varicose veins. She's worried about blood clots it would be reasonable to have her take an aspirin a day while she is traveling. I would not do this on a routine basis. Also make sure wearing good supportive shoe wear and stretching frequently during the travel.

## 2013-12-24 ENCOUNTER — Telehealth: Payer: Self-pay | Admitting: *Deleted

## 2013-12-24 MED ORDER — AMBULATORY NON FORMULARY MEDICATION
Status: DC
Start: 2013-12-24 — End: 2013-12-26

## 2013-12-24 NOTE — Telephone Encounter (Signed)
Pt informed would like rx mailed.Teddy Spike

## 2013-12-24 NOTE — Telephone Encounter (Signed)
Spoke w.Kathlee Nations about the how long pt will need to wear compression stockings. She informed me that usually they will need to be in them for 3-6 mos depending on insurance and that they should be thigh high. If she has already been in the stockings then it may be possible that insurance can accept them if she has already had them and been wearing them.[place]

## 2013-12-24 NOTE — Telephone Encounter (Signed)
Please call patient and let her know that we did speak to Dr. Mee Hives office..A prescription for medical grade compression stockings. She can come by and pick this up at anytime. Recommend [facility] for medical supply or [facility] medical supply. If she takes the prescription and should be able to run it through her insurance.

## 2013-12-26 ENCOUNTER — Other Ambulatory Visit: Payer: Self-pay | Admitting: Family Medicine

## 2013-12-26 MED ORDER — AMBULATORY NON FORMULARY MEDICATION: Status: DC

## 2013-12-26 NOTE — Telephone Encounter (Signed)
rx mailed.Tiffany Bender

## 2014-02-12 ENCOUNTER — Encounter: Payer: Self-pay | Admitting: Vascular Surgery

## 2014-02-13 ENCOUNTER — Ambulatory Visit (HOSPITAL_COMMUNITY)
Admission: RE | Admit: 2014-02-13 | Discharge: 2014-02-13 | Disposition: A | Discharge status: Home / Self Care | Payer: BC Managed Care – PPO | Source: Ambulatory Visit | Attending: Vascular Surgery | Admitting: Vascular Surgery

## 2014-02-13 ENCOUNTER — Ambulatory Visit (INDEPENDENT_AMBULATORY_CARE_PROVIDER_SITE_OTHER): Payer: BC Managed Care – PPO | Admitting: Vascular Surgery

## 2014-02-13 ENCOUNTER — Other Ambulatory Visit: Payer: Self-pay | Admitting: Vascular Surgery

## 2014-02-13 ENCOUNTER — Encounter: Payer: Self-pay | Admitting: Vascular Surgery

## 2014-02-13 ENCOUNTER — Telehealth: Payer: Self-pay

## 2014-02-13 VITALS — BP 146/91 | HR 65 | Ht 64.0 in | Wt 158.2 lb

## 2014-02-13 DIAGNOSIS — I83893 Varicose veins of bilateral lower extremities with other complications: Secondary | ICD-10-CM | POA: Insufficient documentation

## 2014-02-13 DIAGNOSIS — I824Z9 Acute embolism and thrombosis of unspecified deep veins of unspecified distal lower extremity: Secondary | ICD-10-CM

## 2014-02-13 DIAGNOSIS — R609 Edema, unspecified: Secondary | ICD-10-CM

## 2014-02-13 DIAGNOSIS — I824Z2 Acute embolism and thrombosis of unspecified deep veins of left distal lower extremity: Secondary | ICD-10-CM

## 2014-02-13 MED ORDER — ENOXAPARIN SODIUM 80 MG/0.8ML ~~LOC~~ SOLN
1.0000 mg/kg | Freq: Two times a day (BID) | SUBCUTANEOUS | Status: DC
Start: 2014-02-13 — End: 2014-02-13

## 2014-02-13 MED ORDER — WARFARIN SODIUM 5 MG PO TABS: 5.0000 mg | ORAL_TABLET | Freq: Every day | ORAL | Status: DC

## 2014-02-13 MED ORDER — ENOXAPARIN SODIUM 100 MG/ML ~~LOC~~ SOLN: 100.0000 mg | SUBCUTANEOUS | Status: DC

## 2014-02-13 NOTE — Assessment & Plan Note (Signed)
This patient was found to have an acute DVT of the left gastrocnemius vein today. I've explained to her that the options are to repeat the duplex in one week to see if there has been any propagation of the clot versus 6 weeks of anti-coagulation. I would favor 6 weeks of anticoagulation. We will make arrangements with Dr. Gardiner Ramus office for 6 weeks of Coumadin with a Lovenox bridge. We will then see her back in 6 weeks for a follow up duplex scan of the left lower extremity. We have discussed the importance of remaining as active as possible and to avoid prolonged sitting and standing. He does have some trips planned I have encouraged her to consider not taking these trips until this issue has resolved. She has no prior history of DVT.

## 2014-02-13 NOTE — Progress Notes (Signed)
Patient ID: Tiffany Bender, female   DOB: 1951-03-08, 62 y.o.   MRN: [id number]  Reason for Consult: New Evaluation   Varicose veins  Referred by Hali Marry, *  Subjective:     HPI:  Tiffany Bender is a 63 y.o. female who has been having increasing aching pain and swelling of both lower extremities for the last few months. Her symptoms are worse on the left side. She experiences aching pain and throbbing in her legs which is aggravated by standing and relieved with elevation. She has also noted some swelling in both legs but more significantly on the left side.  She has a history of varicose veins and had her left greater saphenous vein stripped many years ago. She denies any previous history of DVT.  She does travel quite a bit as her son is a Occupational hygienist. She's taken several trips to [place] by car. She denies any pleuritic chest pain or shortness of breath.  Past Medical History  Diagnosis Date  . Osteopenia   . Other and unspecified ovarian cysts     history  . Anemia    Family History  Problem Relation Age of Onset  . Adopted: Yes  . Alcohol abuse Father    Past Surgical History  Procedure Laterality Date  . Arthroscopic repair acl      RT knee  . Carpal tunnel release    . Tonsillectomy      right  . Tubal ligation    . Ovarian cyst removal  1988    left   . Bunionectomy  1994    bilateral  . Carpal tunnel release  2000    Left   . Vein ligation and stripping  1981  . Arthroscopic repair acl Right    Short Social History:  History  Substance Use Topics  . Smoking status: Former Smoker    Quit date: 08/24/1983  . Smokeless tobacco: Never Used  . Alcohol Use: Yes     Comment: socially   Allergies  Allergen Reactions  . Amoxicillin     headache  . Ciprofloxacin Other (See Comments)    Pelvic swelling  . Statins Other (See Comments)    Dark brown urine.    Current Outpatient Prescriptions  Medication Sig Dispense Refill  .  calcium-vitamin D (OSCAL) 250-125 MG-UNIT per tablet Take 1 tablet by mouth daily.        . cholecalciferol (VITAMIN D) 1000 UNITS tablet Take 1,000 Units by mouth daily.        . Multiple Vitamin (MULTIVITAMIN) tablet Take 1 tablet by mouth daily.        . Omega-3 Fatty Acids (EPA PO) Take by mouth.      . vitamin C (ASCORBIC ACID) 500 MG tablet Take 500 mg by mouth daily.        . AMBULATORY NON FORMULARY MEDICATION Medication Name: waist high/panty hose medical grade compression stocking. Pressure 20-30mg Hg pressure. Dx varicose veins, leg pain and fatigue/.  2 Units  PRN   No current facility-administered medications for this visit.   Review of Systems  Constitutional: Negative for chills and fever.  Eyes: Negative for loss of vision.  Respiratory: Negative for cough and wheezing.  Cardiovascular: Positive for leg swelling. Negative for chest pain, chest tightness, claudication, dyspnea with exertion, orthopnea and palpitations.  GI: Negative for blood in stool and vomiting.  GU: Negative for dysuria and hematuria.  Musculoskeletal: Negative for leg pain, joint pain and myalgias.  Skin:  Negative for rash and wound.  Neurological: Negative for dizziness and speech difficulty.  Hematologic: Negative for bruises/bleeds easily. Psychiatric: Negative for depressed mood.        Objective:  Objective  Filed Vitals:   02/13/14 1018  BP: 146/91  Pulse: 65  Height: 5\' 4"  (1.626 m)  Weight: 158 lb 3.2 oz (71.759 kg)  SpO2: 99%   Body mass index is 27.14 kg/(m^2).  Physical Exam  Constitutional: She is oriented to person, place, and time. She appears well-developed and well-nourished.  HENT:  Head: Normocephalic and atraumatic.  Neck: Neck supple. No JVD present. No thyromegaly present.  Cardiovascular: Normal rate, regular rhythm, normal heart sounds and normal pulses.  Exam reveals no friction rub.   No murmur heard. Pulses:      Dorsalis pedis pulses are 2+ on the right side,  and 2+ on the left side.  Pulmonary/Chest: Breath sounds normal. She has no wheezes. She has no rales.  Abdominal: Soft. Bowel sounds are normal. There is no tenderness.  I do not palpate an aneurysm.  Musculoskeletal: Normal range of motion. She exhibits edema.  Lymphadenopathy:    She has no cervical adenopathy.  Neurological: She is alert and oriented to person, place, and time. She has normal strength. No sensory deficit.  Skin: No lesion and no rash noted.  The patient has varicose veins on her medial left calf. She has additional varicose veins on the right anterior leg. She has telangiectasias on the lateral right thigh which extends down to the medial distal right thigh.  Psychiatric: She has a normal mood and affect.   Data: I have independently interpreted her venous duplex scan today. She was noted to have evidence of acute DVT of the gastrocnemius vein in the left calf. On the left side her greater saphenous vein has been stripped. There was no other evidence of DVT and no reflux in the deep system or in the small saphenous vein.  On the right side, she had some reflux and a small saphenous vein but no deep vein reflux and no reflux in the greater saphenous vein. There was no DVT on the right.      Assessment/Plan:     Acute deep vein thrombosis (DVT) of distal vein of left lower extremity This patient was found to have an acute DVT of the left gastrocnemius vein today. I've explained to her that the options are to repeat the duplex in one week to see if there has been any propagation of the clot versus 6 weeks of anti-coagulation. I would favor 6 weeks of anticoagulation. We will make arrangements with Dr. Gardiner Ramus office for 6 weeks of Coumadin with a Lovenox bridge. We will then see her back in 6 weeks for a follow up duplex scan of the left lower extremity. We have discussed the importance of remaining as active as possible and to avoid prolonged sitting and standing. He does  have some trips planned I have encouraged her to consider not taking these trips until this issue has resolved. She has no prior history of DVT.  Varicose veins of lower extremities with other complications The patient does have varicose veins bilaterally but no significant reflux except for some reflux in the right short saphenous vein. We have discussed the importance of intermittent leg elevation and the proper positioning for this. She has not done well with compression stockings in the past. We have discussed the importance of avoiding prolonged sitting and standing remaining as active as  possible. I've asked her to consider water aerobics which is also helpful for patients with venous disease. She was found to have an acute DVT of the left gastrocnemius vein today.       Angelia Mould MD [facility]

## 2014-02-13 NOTE — Assessment & Plan Note (Signed)
The patient does have varicose veins bilaterally but no significant reflux except for some reflux in the right short saphenous vein. We have discussed the importance of intermittent leg elevation and the proper positioning for this. She has not done well with compression stockings in the past. We have discussed the importance of avoiding prolonged sitting and standing remaining as active as possible. I've asked her to consider water aerobics which is also helpful for patients with venous disease. She was found to have an acute DVT of the left gastrocnemius vein today.

## 2014-02-13 NOTE — Addendum Note (Signed)
Addended by: Mena Goes on: 02/13/2014 11:01 AM   Modules accepted: Orders

## 2014-02-13 NOTE — Telephone Encounter (Signed)
Please change dose of Lovenox to once daily.

## 2014-02-13 NOTE — Telephone Encounter (Signed)
New rx sent. Needs to start lovenox and coumadin today. Come in Friday for coumdin check

## 2014-02-15 ENCOUNTER — Ambulatory Visit (INDEPENDENT_AMBULATORY_CARE_PROVIDER_SITE_OTHER): Payer: BC Managed Care – PPO | Admitting: Family Medicine

## 2014-02-15 DIAGNOSIS — I824Z2 Acute embolism and thrombosis of unspecified deep veins of left distal lower extremity: Secondary | ICD-10-CM

## 2014-02-15 DIAGNOSIS — I824Z9 Acute embolism and thrombosis of unspecified deep veins of unspecified distal lower extremity: Secondary | ICD-10-CM

## 2014-02-15 LAB — POCT INR: INR: 1

## 2014-02-15 NOTE — Progress Notes (Signed)
Pt. Informed and understood. Pt to return 02/19/2014 @ 10:15 for recheck of INR.

## 2014-02-19 ENCOUNTER — Ambulatory Visit (INDEPENDENT_AMBULATORY_CARE_PROVIDER_SITE_OTHER): Payer: BC Managed Care – PPO | Admitting: Physician Assistant

## 2014-02-19 DIAGNOSIS — I824Z9 Acute embolism and thrombosis of unspecified deep veins of unspecified distal lower extremity: Secondary | ICD-10-CM

## 2014-02-19 DIAGNOSIS — I824Z2 Acute embolism and thrombosis of unspecified deep veins of left distal lower extremity: Secondary | ICD-10-CM

## 2014-02-19 LAB — POCT INR: INR: 1.8

## 2014-02-19 NOTE — Progress Notes (Signed)
   Subjective:    Patient ID: Tiffany Bender, female    DOB: June 24, 1951, 63 y.o.   MRN: [id number]  HPI    Review of Systems     Objective:   Physical Exam        Assessment & Plan:  Increase dose of coumadin to 1.5mg  on Saturday, Monday and Wednesday. 1mg  every other day. Follow up next Monday. Iran Planas PA-C

## 2014-02-19 NOTE — Progress Notes (Signed)
Left message with son for patient to call beck for results./Stacy Angus Seller, CMA

## 2014-02-19 NOTE — Progress Notes (Signed)
Patient informed with understanding and made a recheck appointment for Monday (02/25/2014) / Estil Daft

## 2014-02-25 ENCOUNTER — Ambulatory Visit (INDEPENDENT_AMBULATORY_CARE_PROVIDER_SITE_OTHER): Payer: BC Managed Care – PPO | Admitting: Family Medicine

## 2014-02-25 VITALS — BP 120/82 | HR 68 | Temp 98.0°F | Wt 157.0 lb

## 2014-02-25 DIAGNOSIS — I824Z2 Acute embolism and thrombosis of unspecified deep veins of left distal lower extremity: Secondary | ICD-10-CM

## 2014-02-25 DIAGNOSIS — I824Z9 Acute embolism and thrombosis of unspecified deep veins of unspecified distal lower extremity: Secondary | ICD-10-CM | POA: Diagnosis not present

## 2014-02-25 LAB — POCT INR: INR: 2.6

## 2014-02-25 NOTE — Progress Notes (Signed)
   Subjective:    Patient ID: Tiffany Bender, female    DOB: 07-22-1951, 63 y.o.   MRN: [id number]  HPI    Review of Systems     Objective:   Physical Exam        Assessment & Plan:  OK to stop Lovenox.  Recommend decrease acitivity for next 2-3 weeks until has stime for clot to dissolve.

## 2014-02-25 NOTE — Progress Notes (Signed)
Spoke with Tiffany Bender and informed she understood. She agreed to do another [place] on left leg.

## 2014-02-26 ENCOUNTER — Telehealth: Payer: Self-pay | Admitting: *Deleted

## 2014-02-26 ENCOUNTER — Ambulatory Visit: Payer: BC Managed Care – PPO

## 2014-02-26 ENCOUNTER — Other Ambulatory Visit: Payer: Self-pay | Admitting: Family Medicine

## 2014-02-26 DIAGNOSIS — I82402 Acute embolism and thrombosis of unspecified deep veins of left lower extremity: Secondary | ICD-10-CM

## 2014-02-26 NOTE — Telephone Encounter (Signed)
Pt called imaging and wanted to know her results Bonnita Nasuti informed the pt that her results were sent to Dr. Madilyn Fireman and that she will need to await Dr. Gardiner Ramus phone call with regards to her results.  Pt then showed up with her results w/multiple questions as to if she can travel, if she has to continue on the meds. I consulted Dr. Madilyn Fireman and was told to inform pt that she can travel as long as she can stop every 1 to 1 1/2 hrs to get up and stretch and walk around and that she will need to continue the medication for the full 6 weeks and continue her weekly monitoring and medication instructions until told otherwise.Audelia Hives [place]

## 2014-02-28 ENCOUNTER — Telehealth: Payer: Self-pay

## 2014-02-28 ENCOUNTER — Telehealth: Payer: Self-pay | Admitting: *Deleted

## 2014-02-28 NOTE — Telephone Encounter (Signed)
Phone call from pt.  Reported she had another venous duplex done 02/26/14; ordered by her PCP, Dr. Madilyn Fireman.  Was advised that her scan "showed no evidence of acute or chronic DVT of left lower extremity."  Was advised per her PCP to limit her activity until the clot resolved.  Questioned why her recommendation from Dr. Scot Dock and the PCP are very different.  Stated was instructed by Dr. Scot Dock "to remain active and even do water aerobics."  Voiced concern about the difference in activity recommendations.  Advised that Dr. Scot Dock is on vacation this week, so unable to discuss with him.  Encouraged her to contact her PCP to readdress her activity level, since the recent Venous Duplex was negative for DVT on 7/7.  Spent approx. 25 minutes on the phone, attempting to work through pt's anxiety and concern about her situation.

## 2014-02-28 NOTE — Telephone Encounter (Signed)
If she is able to do all this without pain then ok to do so.

## 2014-02-28 NOTE — Telephone Encounter (Signed)
Pt called and stated that she spoke with Dr. Nicole Cella nurse and she said that she is getting conflicting advice as to how she is to proceed with activities. She states that according to Dr. Gardiner Ramus recommendations she is to decrease acitivity for next 2-3 weeks until has stime for clot to dissolve. And was told by Dr. Nicole Cella nurse that Dr. Scot Dock advised her to remain active and even do water aerobics. I informed her that when she came in for her INR check on 7/6 the nurse noticed that her leg was different in size and color and this may have been why Dr. Madilyn Fireman made the recommendation of limited activity. However, I informed her that I would forward her concerns of the conflicting recommendations to Dr. Madilyn Fireman and get back to her with Dr. Gardiner Ramus recommendations. I did inquire about her activities and she told me that she walks a total of 8 laps and will walk 4 and rest for 15 mins in between. She also will go into the pool for about 1 hour and will do light breast and side strokes. When she is at home she will elevate her legs on 2 pillows. Dr. Scot Dock advised her to use 1 pillow.Tiffany Bender, [name]

## 2014-02-28 NOTE — Telephone Encounter (Signed)
Called and informed pt of Dr. Gardiner Ramus recommendations. Pt voiced understanding.Tiffany Bender [place]

## 2014-03-04 ENCOUNTER — Ambulatory Visit (INDEPENDENT_AMBULATORY_CARE_PROVIDER_SITE_OTHER): Payer: BC Managed Care – PPO | Admitting: Family Medicine

## 2014-03-04 VITALS — BP 118/82 | HR 69

## 2014-03-04 DIAGNOSIS — I824Z2 Acute embolism and thrombosis of unspecified deep veins of left distal lower extremity: Secondary | ICD-10-CM

## 2014-03-04 DIAGNOSIS — I824Z9 Acute embolism and thrombosis of unspecified deep veins of unspecified distal lower extremity: Secondary | ICD-10-CM | POA: Diagnosis not present

## 2014-03-04 LAB — POCT INR: INR: 1.5

## 2014-03-04 NOTE — Progress Notes (Signed)
   Subjective:    Patient ID: Tiffany Bender, female    DOB: [birth year], 63 y.o.   MRN: [id number] Pt notified of dosing instructions.  She also requested an email copy of her dose.  Email sent. [name], CMA HPI    Review of Systems     Objective:   Physical Exam        Assessment & Plan:

## 2014-03-22 ENCOUNTER — Telehealth: Payer: Self-pay | Admitting: Vascular Surgery

## 2014-03-22 NOTE — Telephone Encounter (Signed)
Tiffany Bender called to cancel her follow up in August. She has transferred her care from [facility] to [facility]. She expressed that it had nothing to do with the care that she has received here. She stated that Michaele Offer and Dr Scot Dock have taken wonderful care of her and she appreciated everything that our office has done for her.

## 2014-03-27 ENCOUNTER — Ambulatory Visit: Payer: BC Managed Care – PPO | Admitting: Vascular Surgery

## 2014-03-27 ENCOUNTER — Other Ambulatory Visit (HOSPITAL_COMMUNITY): Payer: BC Managed Care – PPO

## 2014-05-24 DIAGNOSIS — M8580 Other specified disorders of bone density and structure, unspecified site: Secondary | ICD-10-CM | POA: Insufficient documentation

## 2014-08-22 DIAGNOSIS — K581 Irritable bowel syndrome with constipation: Secondary | ICD-10-CM | POA: Insufficient documentation

## 2015-01-29 ENCOUNTER — Encounter: Payer: Self-pay | Admitting: Internal Medicine

## 2015-08-21 DIAGNOSIS — N941 Unspecified dyspareunia: Secondary | ICD-10-CM

## 2015-08-21 HISTORY — DX: Unspecified dyspareunia: N94.10

## 2017-02-03 ENCOUNTER — Other Ambulatory Visit: Payer: Self-pay | Admitting: Family Medicine

## 2017-02-03 DIAGNOSIS — M7989 Other specified soft tissue disorders: Secondary | ICD-10-CM

## 2017-02-04 ENCOUNTER — Ambulatory Visit: Payer: BC Managed Care – PPO

## 2017-02-04 DIAGNOSIS — M7989 Other specified soft tissue disorders: Secondary | ICD-10-CM

## 2017-04-05 ENCOUNTER — Encounter: Payer: BC Managed Care – PPO | Admitting: Vascular Surgery

## 2017-04-05 ENCOUNTER — Encounter (HOSPITAL_COMMUNITY): Payer: BC Managed Care – PPO

## 2017-11-21 ENCOUNTER — Encounter: Payer: Self-pay | Admitting: Internal Medicine

## 2018-01-24 DIAGNOSIS — E7800 Pure hypercholesterolemia, unspecified: Secondary | ICD-10-CM | POA: Diagnosis not present

## 2018-01-24 DIAGNOSIS — Z130 Encounter for screening for diseases of the blood and blood-forming organs and certain disorders involving the immune mechanism: Secondary | ICD-10-CM | POA: Diagnosis not present

## 2018-02-02 DIAGNOSIS — M7732 Calcaneal spur, left foot: Secondary | ICD-10-CM | POA: Diagnosis not present

## 2018-02-02 DIAGNOSIS — M8589 Other specified disorders of bone density and structure, multiple sites: Secondary | ICD-10-CM | POA: Diagnosis not present

## 2018-02-02 DIAGNOSIS — H40003 Preglaucoma, unspecified, bilateral: Secondary | ICD-10-CM | POA: Diagnosis not present

## 2018-02-02 DIAGNOSIS — M24477 Recurrent dislocation, right toe(s): Secondary | ICD-10-CM | POA: Diagnosis not present

## 2018-02-02 DIAGNOSIS — M2011 Hallux valgus (acquired), right foot: Secondary | ICD-10-CM | POA: Diagnosis not present

## 2018-02-02 DIAGNOSIS — M2012 Hallux valgus (acquired), left foot: Secondary | ICD-10-CM | POA: Diagnosis not present

## 2018-02-07 DIAGNOSIS — E7800 Pure hypercholesterolemia, unspecified: Secondary | ICD-10-CM | POA: Diagnosis not present

## 2018-03-07 DIAGNOSIS — M2012 Hallux valgus (acquired), left foot: Secondary | ICD-10-CM | POA: Diagnosis not present

## 2018-03-07 DIAGNOSIS — M2011 Hallux valgus (acquired), right foot: Secondary | ICD-10-CM | POA: Diagnosis not present

## 2018-03-07 DIAGNOSIS — M24477 Recurrent dislocation, right toe(s): Secondary | ICD-10-CM | POA: Diagnosis not present

## 2018-03-15 DIAGNOSIS — M2012 Hallux valgus (acquired), left foot: Secondary | ICD-10-CM | POA: Diagnosis not present

## 2018-03-15 DIAGNOSIS — M2011 Hallux valgus (acquired), right foot: Secondary | ICD-10-CM | POA: Diagnosis not present

## 2018-03-15 DIAGNOSIS — T8484XA Pain due to internal orthopedic prosthetic devices, implants and grafts, initial encounter: Secondary | ICD-10-CM | POA: Diagnosis not present

## 2018-03-29 DIAGNOSIS — R300 Dysuria: Secondary | ICD-10-CM | POA: Diagnosis not present

## 2018-03-29 DIAGNOSIS — R350 Frequency of micturition: Secondary | ICD-10-CM | POA: Diagnosis not present

## 2018-03-29 DIAGNOSIS — R3915 Urgency of urination: Secondary | ICD-10-CM | POA: Diagnosis not present

## 2018-03-29 DIAGNOSIS — N390 Urinary tract infection, site not specified: Secondary | ICD-10-CM | POA: Diagnosis not present

## 2018-03-30 DIAGNOSIS — M2012 Hallux valgus (acquired), left foot: Secondary | ICD-10-CM | POA: Diagnosis not present

## 2018-04-17 DIAGNOSIS — M2012 Hallux valgus (acquired), left foot: Secondary | ICD-10-CM | POA: Diagnosis not present

## 2018-05-04 DIAGNOSIS — M2012 Hallux valgus (acquired), left foot: Secondary | ICD-10-CM | POA: Diagnosis not present

## 2018-05-16 DIAGNOSIS — R102 Pelvic and perineal pain: Secondary | ICD-10-CM | POA: Diagnosis not present

## 2018-05-16 DIAGNOSIS — R300 Dysuria: Secondary | ICD-10-CM | POA: Diagnosis not present

## 2018-05-16 DIAGNOSIS — N390 Urinary tract infection, site not specified: Secondary | ICD-10-CM | POA: Diagnosis not present

## 2018-05-16 DIAGNOSIS — R140 Abdominal distension (gaseous): Secondary | ICD-10-CM | POA: Diagnosis not present

## 2018-05-16 DIAGNOSIS — N952 Postmenopausal atrophic vaginitis: Secondary | ICD-10-CM | POA: Diagnosis not present

## 2018-05-16 DIAGNOSIS — K649 Unspecified hemorrhoids: Secondary | ICD-10-CM | POA: Diagnosis not present

## 2018-05-16 DIAGNOSIS — K5900 Constipation, unspecified: Secondary | ICD-10-CM | POA: Diagnosis not present

## 2018-05-17 DIAGNOSIS — I6523 Occlusion and stenosis of bilateral carotid arteries: Secondary | ICD-10-CM | POA: Diagnosis not present

## 2018-05-17 DIAGNOSIS — K5909 Other constipation: Secondary | ICD-10-CM | POA: Diagnosis not present

## 2018-05-17 DIAGNOSIS — E785 Hyperlipidemia, unspecified: Secondary | ICD-10-CM | POA: Diagnosis not present

## 2018-05-17 DIAGNOSIS — E7800 Pure hypercholesterolemia, unspecified: Secondary | ICD-10-CM | POA: Diagnosis not present

## 2018-05-19 DIAGNOSIS — K760 Fatty (change of) liver, not elsewhere classified: Secondary | ICD-10-CM | POA: Diagnosis not present

## 2018-05-19 DIAGNOSIS — R7989 Other specified abnormal findings of blood chemistry: Secondary | ICD-10-CM | POA: Diagnosis not present

## 2018-05-19 DIAGNOSIS — R945 Abnormal results of liver function studies: Secondary | ICD-10-CM | POA: Diagnosis not present

## 2018-05-29 DIAGNOSIS — R945 Abnormal results of liver function studies: Secondary | ICD-10-CM | POA: Diagnosis not present

## 2018-06-01 DIAGNOSIS — M2012 Hallux valgus (acquired), left foot: Secondary | ICD-10-CM | POA: Diagnosis not present

## 2018-07-19 DIAGNOSIS — I83812 Varicose veins of left lower extremities with pain: Secondary | ICD-10-CM | POA: Diagnosis not present

## 2018-08-03 DIAGNOSIS — M2011 Hallux valgus (acquired), right foot: Secondary | ICD-10-CM | POA: Diagnosis not present

## 2018-08-03 DIAGNOSIS — M7741 Metatarsalgia, right foot: Secondary | ICD-10-CM | POA: Diagnosis not present

## 2018-08-03 DIAGNOSIS — M24574 Contracture, right foot: Secondary | ICD-10-CM | POA: Diagnosis not present

## 2018-08-08 DIAGNOSIS — H25813 Combined forms of age-related cataract, bilateral: Secondary | ICD-10-CM | POA: Diagnosis not present

## 2018-08-08 DIAGNOSIS — H02834 Dermatochalasis of left upper eyelid: Secondary | ICD-10-CM | POA: Diagnosis not present

## 2018-08-08 DIAGNOSIS — H02831 Dermatochalasis of right upper eyelid: Secondary | ICD-10-CM | POA: Diagnosis not present

## 2018-08-08 DIAGNOSIS — H527 Unspecified disorder of refraction: Secondary | ICD-10-CM | POA: Diagnosis not present

## 2018-08-08 DIAGNOSIS — H40003 Preglaucoma, unspecified, bilateral: Secondary | ICD-10-CM | POA: Diagnosis not present

## 2019-01-17 DIAGNOSIS — J029 Acute pharyngitis, unspecified: Secondary | ICD-10-CM | POA: Diagnosis not present

## 2019-01-17 DIAGNOSIS — Z7189 Other specified counseling: Secondary | ICD-10-CM | POA: Diagnosis not present

## 2019-02-01 DIAGNOSIS — L84 Corns and callosities: Secondary | ICD-10-CM | POA: Diagnosis not present

## 2019-02-01 DIAGNOSIS — M7740 Metatarsalgia, unspecified foot: Secondary | ICD-10-CM | POA: Diagnosis not present

## 2019-02-01 DIAGNOSIS — M2012 Hallux valgus (acquired), left foot: Secondary | ICD-10-CM | POA: Diagnosis not present

## 2019-02-01 DIAGNOSIS — M2011 Hallux valgus (acquired), right foot: Secondary | ICD-10-CM | POA: Diagnosis not present

## 2019-02-01 DIAGNOSIS — R52 Pain, unspecified: Secondary | ICD-10-CM | POA: Diagnosis not present

## 2019-02-05 DIAGNOSIS — H25813 Combined forms of age-related cataract, bilateral: Secondary | ICD-10-CM | POA: Diagnosis not present

## 2019-02-05 DIAGNOSIS — H11412 Vascular abnormalities of conjunctiva, left eye: Secondary | ICD-10-CM | POA: Diagnosis not present

## 2019-02-05 DIAGNOSIS — H40003 Preglaucoma, unspecified, bilateral: Secondary | ICD-10-CM | POA: Diagnosis not present

## 2019-02-05 DIAGNOSIS — H02834 Dermatochalasis of left upper eyelid: Secondary | ICD-10-CM | POA: Diagnosis not present

## 2019-02-05 DIAGNOSIS — H527 Unspecified disorder of refraction: Secondary | ICD-10-CM | POA: Diagnosis not present

## 2019-02-05 DIAGNOSIS — H02831 Dermatochalasis of right upper eyelid: Secondary | ICD-10-CM | POA: Diagnosis not present

## 2019-02-15 DIAGNOSIS — M2011 Hallux valgus (acquired), right foot: Secondary | ICD-10-CM | POA: Diagnosis not present

## 2019-02-15 DIAGNOSIS — M2012 Hallux valgus (acquired), left foot: Secondary | ICD-10-CM | POA: Diagnosis not present

## 2019-02-15 DIAGNOSIS — M7740 Metatarsalgia, unspecified foot: Secondary | ICD-10-CM | POA: Diagnosis not present

## 2019-02-15 DIAGNOSIS — M79673 Pain in unspecified foot: Secondary | ICD-10-CM | POA: Diagnosis not present

## 2019-02-28 DIAGNOSIS — L570 Actinic keratosis: Secondary | ICD-10-CM | POA: Diagnosis not present

## 2019-02-28 DIAGNOSIS — D225 Melanocytic nevi of trunk: Secondary | ICD-10-CM | POA: Diagnosis not present

## 2019-02-28 DIAGNOSIS — L821 Other seborrheic keratosis: Secondary | ICD-10-CM | POA: Diagnosis not present

## 2019-04-13 DIAGNOSIS — G8929 Other chronic pain: Secondary | ICD-10-CM | POA: Diagnosis not present

## 2019-04-13 DIAGNOSIS — M79645 Pain in left finger(s): Secondary | ICD-10-CM | POA: Diagnosis not present

## 2019-04-13 DIAGNOSIS — M19049 Primary osteoarthritis, unspecified hand: Secondary | ICD-10-CM | POA: Diagnosis not present

## 2019-04-19 DIAGNOSIS — M1812 Unilateral primary osteoarthritis of first carpometacarpal joint, left hand: Secondary | ICD-10-CM | POA: Diagnosis not present

## 2019-05-22 DIAGNOSIS — M1812 Unilateral primary osteoarthritis of first carpometacarpal joint, left hand: Secondary | ICD-10-CM | POA: Diagnosis not present

## 2019-06-12 DIAGNOSIS — Z23 Encounter for immunization: Secondary | ICD-10-CM | POA: Diagnosis not present

## 2019-07-25 DIAGNOSIS — I83812 Varicose veins of left lower extremities with pain: Secondary | ICD-10-CM | POA: Diagnosis not present

## 2019-09-03 DIAGNOSIS — H40003 Preglaucoma, unspecified, bilateral: Secondary | ICD-10-CM | POA: Diagnosis not present

## 2019-09-03 DIAGNOSIS — H25813 Combined forms of age-related cataract, bilateral: Secondary | ICD-10-CM | POA: Diagnosis not present

## 2019-09-03 DIAGNOSIS — H527 Unspecified disorder of refraction: Secondary | ICD-10-CM | POA: Diagnosis not present

## 2019-09-06 DIAGNOSIS — R52 Pain, unspecified: Secondary | ICD-10-CM | POA: Diagnosis not present

## 2019-09-06 DIAGNOSIS — M25572 Pain in left ankle and joints of left foot: Secondary | ICD-10-CM | POA: Diagnosis not present

## 2019-09-19 DIAGNOSIS — I872 Venous insufficiency (chronic) (peripheral): Secondary | ICD-10-CM | POA: Diagnosis not present

## 2019-09-19 DIAGNOSIS — I83892 Varicose veins of left lower extremities with other complications: Secondary | ICD-10-CM | POA: Diagnosis not present

## 2019-09-25 DIAGNOSIS — I83892 Varicose veins of left lower extremities with other complications: Secondary | ICD-10-CM | POA: Diagnosis not present

## 2019-09-25 DIAGNOSIS — I872 Venous insufficiency (chronic) (peripheral): Secondary | ICD-10-CM | POA: Diagnosis not present

## 2019-09-28 DIAGNOSIS — I83891 Varicose veins of right lower extremities with other complications: Secondary | ICD-10-CM | POA: Diagnosis not present

## 2019-10-09 DIAGNOSIS — R448 Other symptoms and signs involving general sensations and perceptions: Secondary | ICD-10-CM | POA: Diagnosis not present

## 2019-10-09 DIAGNOSIS — E785 Hyperlipidemia, unspecified: Secondary | ICD-10-CM | POA: Diagnosis not present

## 2019-10-09 DIAGNOSIS — M8580 Other specified disorders of bone density and structure, unspecified site: Secondary | ICD-10-CM | POA: Diagnosis not present

## 2019-10-09 DIAGNOSIS — I83893 Varicose veins of bilateral lower extremities with other complications: Secondary | ICD-10-CM | POA: Diagnosis not present

## 2019-10-09 DIAGNOSIS — Z7689 Persons encountering health services in other specified circumstances: Secondary | ICD-10-CM | POA: Diagnosis not present

## 2019-10-09 DIAGNOSIS — E559 Vitamin D deficiency, unspecified: Secondary | ICD-10-CM | POA: Diagnosis not present

## 2019-10-09 DIAGNOSIS — Z79899 Other long term (current) drug therapy: Secondary | ICD-10-CM | POA: Diagnosis not present

## 2019-10-09 DIAGNOSIS — Z780 Asymptomatic menopausal state: Secondary | ICD-10-CM | POA: Diagnosis not present

## 2019-10-09 DIAGNOSIS — N390 Urinary tract infection, site not specified: Secondary | ICD-10-CM | POA: Diagnosis not present

## 2019-10-09 DIAGNOSIS — K5900 Constipation, unspecified: Secondary | ICD-10-CM | POA: Diagnosis not present

## 2019-10-09 DIAGNOSIS — Z1231 Encounter for screening mammogram for malignant neoplasm of breast: Secondary | ICD-10-CM | POA: Diagnosis not present

## 2019-10-09 DIAGNOSIS — Z0000 Encounter for general adult medical examination without abnormal findings: Secondary | ICD-10-CM | POA: Diagnosis not present

## 2019-10-11 DIAGNOSIS — E559 Vitamin D deficiency, unspecified: Secondary | ICD-10-CM | POA: Insufficient documentation

## 2019-10-19 DIAGNOSIS — I83892 Varicose veins of left lower extremities with other complications: Secondary | ICD-10-CM | POA: Diagnosis not present

## 2019-10-19 DIAGNOSIS — I872 Venous insufficiency (chronic) (peripheral): Secondary | ICD-10-CM | POA: Diagnosis not present

## 2019-10-24 DIAGNOSIS — M85851 Other specified disorders of bone density and structure, right thigh: Secondary | ICD-10-CM | POA: Diagnosis not present

## 2019-10-24 DIAGNOSIS — M8580 Other specified disorders of bone density and structure, unspecified site: Secondary | ICD-10-CM | POA: Diagnosis not present

## 2019-10-24 DIAGNOSIS — Z780 Asymptomatic menopausal state: Secondary | ICD-10-CM | POA: Diagnosis not present

## 2019-10-24 DIAGNOSIS — Z1231 Encounter for screening mammogram for malignant neoplasm of breast: Secondary | ICD-10-CM | POA: Diagnosis not present

## 2019-10-31 DIAGNOSIS — I83891 Varicose veins of right lower extremities with other complications: Secondary | ICD-10-CM | POA: Diagnosis not present

## 2019-10-31 DIAGNOSIS — I872 Venous insufficiency (chronic) (peripheral): Secondary | ICD-10-CM | POA: Diagnosis not present

## 2019-11-14 DIAGNOSIS — I872 Venous insufficiency (chronic) (peripheral): Secondary | ICD-10-CM | POA: Diagnosis not present

## 2019-11-14 DIAGNOSIS — I83892 Varicose veins of left lower extremities with other complications: Secondary | ICD-10-CM | POA: Diagnosis not present

## 2019-11-20 DIAGNOSIS — E785 Hyperlipidemia, unspecified: Secondary | ICD-10-CM | POA: Diagnosis not present

## 2019-11-29 DIAGNOSIS — I83891 Varicose veins of right lower extremities with other complications: Secondary | ICD-10-CM | POA: Diagnosis not present

## 2019-11-29 DIAGNOSIS — I872 Venous insufficiency (chronic) (peripheral): Secondary | ICD-10-CM | POA: Diagnosis not present

## 2019-12-03 DIAGNOSIS — E785 Hyperlipidemia, unspecified: Secondary | ICD-10-CM | POA: Diagnosis not present

## 2019-12-13 DIAGNOSIS — I83891 Varicose veins of right lower extremities with other complications: Secondary | ICD-10-CM | POA: Diagnosis not present

## 2019-12-13 DIAGNOSIS — I872 Venous insufficiency (chronic) (peripheral): Secondary | ICD-10-CM | POA: Diagnosis not present

## 2019-12-27 DIAGNOSIS — I83892 Varicose veins of left lower extremities with other complications: Secondary | ICD-10-CM | POA: Diagnosis not present

## 2019-12-27 DIAGNOSIS — I872 Venous insufficiency (chronic) (peripheral): Secondary | ICD-10-CM | POA: Diagnosis not present

## 2020-01-15 DIAGNOSIS — M25512 Pain in left shoulder: Secondary | ICD-10-CM | POA: Diagnosis not present

## 2020-01-15 DIAGNOSIS — K5900 Constipation, unspecified: Secondary | ICD-10-CM | POA: Diagnosis not present

## 2020-01-15 DIAGNOSIS — N390 Urinary tract infection, site not specified: Secondary | ICD-10-CM | POA: Diagnosis not present

## 2020-01-15 DIAGNOSIS — E785 Hyperlipidemia, unspecified: Secondary | ICD-10-CM | POA: Diagnosis not present

## 2020-01-15 DIAGNOSIS — I83893 Varicose veins of bilateral lower extremities with other complications: Secondary | ICD-10-CM | POA: Diagnosis not present

## 2020-01-15 DIAGNOSIS — E559 Vitamin D deficiency, unspecified: Secondary | ICD-10-CM | POA: Diagnosis not present

## 2020-01-15 DIAGNOSIS — M8580 Other specified disorders of bone density and structure, unspecified site: Secondary | ICD-10-CM | POA: Diagnosis not present

## 2020-01-23 DIAGNOSIS — M25512 Pain in left shoulder: Secondary | ICD-10-CM | POA: Diagnosis not present

## 2020-01-23 DIAGNOSIS — G8929 Other chronic pain: Secondary | ICD-10-CM | POA: Diagnosis not present

## 2020-01-23 DIAGNOSIS — M50323 Other cervical disc degeneration at C6-C7 level: Secondary | ICD-10-CM | POA: Diagnosis not present

## 2020-01-23 DIAGNOSIS — M7552 Bursitis of left shoulder: Secondary | ICD-10-CM | POA: Diagnosis not present

## 2020-01-23 DIAGNOSIS — M50322 Other cervical disc degeneration at C5-C6 level: Secondary | ICD-10-CM | POA: Diagnosis not present

## 2020-01-30 DIAGNOSIS — I872 Venous insufficiency (chronic) (peripheral): Secondary | ICD-10-CM | POA: Diagnosis not present

## 2020-01-30 DIAGNOSIS — I83891 Varicose veins of right lower extremities with other complications: Secondary | ICD-10-CM | POA: Diagnosis not present

## 2020-02-28 DIAGNOSIS — M4802 Spinal stenosis, cervical region: Secondary | ICD-10-CM | POA: Diagnosis not present

## 2020-02-28 DIAGNOSIS — M7918 Myalgia, other site: Secondary | ICD-10-CM | POA: Diagnosis not present

## 2020-02-28 DIAGNOSIS — M5030 Other cervical disc degeneration, unspecified cervical region: Secondary | ICD-10-CM | POA: Diagnosis not present

## 2020-02-28 DIAGNOSIS — M47812 Spondylosis without myelopathy or radiculopathy, cervical region: Secondary | ICD-10-CM | POA: Diagnosis not present

## 2020-02-28 DIAGNOSIS — G8929 Other chronic pain: Secondary | ICD-10-CM | POA: Diagnosis not present

## 2020-02-28 DIAGNOSIS — G894 Chronic pain syndrome: Secondary | ICD-10-CM | POA: Diagnosis not present

## 2020-02-28 DIAGNOSIS — M4722 Other spondylosis with radiculopathy, cervical region: Secondary | ICD-10-CM | POA: Diagnosis not present

## 2020-03-11 DIAGNOSIS — H40003 Preglaucoma, unspecified, bilateral: Secondary | ICD-10-CM | POA: Diagnosis not present

## 2020-03-11 DIAGNOSIS — H527 Unspecified disorder of refraction: Secondary | ICD-10-CM | POA: Diagnosis not present

## 2020-03-13 DIAGNOSIS — M5030 Other cervical disc degeneration, unspecified cervical region: Secondary | ICD-10-CM | POA: Diagnosis not present

## 2020-03-13 DIAGNOSIS — M4722 Other spondylosis with radiculopathy, cervical region: Secondary | ICD-10-CM | POA: Diagnosis not present

## 2020-03-13 DIAGNOSIS — M4802 Spinal stenosis, cervical region: Secondary | ICD-10-CM | POA: Diagnosis not present

## 2020-04-01 DIAGNOSIS — M6289 Other specified disorders of muscle: Secondary | ICD-10-CM | POA: Diagnosis not present

## 2020-04-01 DIAGNOSIS — R29898 Other symptoms and signs involving the musculoskeletal system: Secondary | ICD-10-CM | POA: Diagnosis not present

## 2020-04-01 DIAGNOSIS — M4722 Other spondylosis with radiculopathy, cervical region: Secondary | ICD-10-CM | POA: Diagnosis not present

## 2020-04-01 DIAGNOSIS — M25512 Pain in left shoulder: Secondary | ICD-10-CM | POA: Diagnosis not present

## 2020-04-01 DIAGNOSIS — M6281 Muscle weakness (generalized): Secondary | ICD-10-CM | POA: Diagnosis not present

## 2020-04-01 DIAGNOSIS — M25612 Stiffness of left shoulder, not elsewhere classified: Secondary | ICD-10-CM | POA: Diagnosis not present

## 2020-04-01 DIAGNOSIS — M542 Cervicalgia: Secondary | ICD-10-CM | POA: Diagnosis not present

## 2020-04-08 DIAGNOSIS — M4722 Other spondylosis with radiculopathy, cervical region: Secondary | ICD-10-CM | POA: Diagnosis not present

## 2020-04-08 DIAGNOSIS — M6289 Other specified disorders of muscle: Secondary | ICD-10-CM | POA: Diagnosis not present

## 2020-04-08 DIAGNOSIS — M25512 Pain in left shoulder: Secondary | ICD-10-CM | POA: Diagnosis not present

## 2020-04-08 DIAGNOSIS — M25612 Stiffness of left shoulder, not elsewhere classified: Secondary | ICD-10-CM | POA: Diagnosis not present

## 2020-04-08 DIAGNOSIS — M6281 Muscle weakness (generalized): Secondary | ICD-10-CM | POA: Diagnosis not present

## 2020-04-08 DIAGNOSIS — M542 Cervicalgia: Secondary | ICD-10-CM | POA: Diagnosis not present

## 2020-04-18 DIAGNOSIS — M4722 Other spondylosis with radiculopathy, cervical region: Secondary | ICD-10-CM | POA: Diagnosis not present

## 2020-04-18 DIAGNOSIS — M25612 Stiffness of left shoulder, not elsewhere classified: Secondary | ICD-10-CM | POA: Diagnosis not present

## 2020-04-18 DIAGNOSIS — M25512 Pain in left shoulder: Secondary | ICD-10-CM | POA: Diagnosis not present

## 2020-04-18 DIAGNOSIS — M6289 Other specified disorders of muscle: Secondary | ICD-10-CM | POA: Diagnosis not present

## 2020-04-18 DIAGNOSIS — M542 Cervicalgia: Secondary | ICD-10-CM | POA: Diagnosis not present

## 2020-04-18 DIAGNOSIS — M6281 Muscle weakness (generalized): Secondary | ICD-10-CM | POA: Diagnosis not present

## 2020-04-29 DIAGNOSIS — M4722 Other spondylosis with radiculopathy, cervical region: Secondary | ICD-10-CM | POA: Diagnosis not present

## 2020-05-06 DIAGNOSIS — I83892 Varicose veins of left lower extremities with other complications: Secondary | ICD-10-CM | POA: Diagnosis not present

## 2020-05-06 DIAGNOSIS — M4722 Other spondylosis with radiculopathy, cervical region: Secondary | ICD-10-CM | POA: Diagnosis not present

## 2020-05-06 DIAGNOSIS — I872 Venous insufficiency (chronic) (peripheral): Secondary | ICD-10-CM | POA: Diagnosis not present

## 2020-05-06 DIAGNOSIS — I83891 Varicose veins of right lower extremities with other complications: Secondary | ICD-10-CM | POA: Diagnosis not present

## 2020-07-22 DIAGNOSIS — Z881 Allergy status to other antibiotic agents status: Secondary | ICD-10-CM | POA: Diagnosis not present

## 2020-07-22 DIAGNOSIS — E785 Hyperlipidemia, unspecified: Secondary | ICD-10-CM | POA: Diagnosis not present

## 2020-07-22 DIAGNOSIS — E559 Vitamin D deficiency, unspecified: Secondary | ICD-10-CM | POA: Diagnosis not present

## 2020-07-22 DIAGNOSIS — M5030 Other cervical disc degeneration, unspecified cervical region: Secondary | ICD-10-CM | POA: Diagnosis not present

## 2020-07-22 DIAGNOSIS — K5900 Constipation, unspecified: Secondary | ICD-10-CM | POA: Diagnosis not present

## 2020-07-22 DIAGNOSIS — N390 Urinary tract infection, site not specified: Secondary | ICD-10-CM | POA: Diagnosis not present

## 2020-07-22 DIAGNOSIS — R21 Rash and other nonspecific skin eruption: Secondary | ICD-10-CM | POA: Diagnosis not present

## 2020-07-22 DIAGNOSIS — M8580 Other specified disorders of bone density and structure, unspecified site: Secondary | ICD-10-CM | POA: Diagnosis not present

## 2020-09-09 DIAGNOSIS — H02403 Unspecified ptosis of bilateral eyelids: Secondary | ICD-10-CM | POA: Diagnosis not present

## 2020-09-09 DIAGNOSIS — H25813 Combined forms of age-related cataract, bilateral: Secondary | ICD-10-CM | POA: Diagnosis not present

## 2020-09-09 DIAGNOSIS — H527 Unspecified disorder of refraction: Secondary | ICD-10-CM | POA: Diagnosis not present

## 2020-09-09 DIAGNOSIS — H40003 Preglaucoma, unspecified, bilateral: Secondary | ICD-10-CM | POA: Diagnosis not present

## 2020-09-09 DIAGNOSIS — H02831 Dermatochalasis of right upper eyelid: Secondary | ICD-10-CM | POA: Diagnosis not present

## 2020-09-09 DIAGNOSIS — H02834 Dermatochalasis of left upper eyelid: Secondary | ICD-10-CM | POA: Diagnosis not present

## 2020-09-22 DIAGNOSIS — R509 Fever, unspecified: Secondary | ICD-10-CM | POA: Diagnosis not present

## 2020-09-22 DIAGNOSIS — R300 Dysuria: Secondary | ICD-10-CM | POA: Diagnosis not present

## 2020-09-22 DIAGNOSIS — R197 Diarrhea, unspecified: Secondary | ICD-10-CM | POA: Diagnosis not present

## 2020-09-23 DIAGNOSIS — R197 Diarrhea, unspecified: Secondary | ICD-10-CM | POA: Diagnosis not present

## 2020-09-23 DIAGNOSIS — R300 Dysuria: Secondary | ICD-10-CM | POA: Diagnosis not present

## 2020-09-23 DIAGNOSIS — R509 Fever, unspecified: Secondary | ICD-10-CM | POA: Diagnosis not present

## 2020-10-14 DIAGNOSIS — I83891 Varicose veins of right lower extremities with other complications: Secondary | ICD-10-CM | POA: Diagnosis not present

## 2020-10-14 DIAGNOSIS — I83892 Varicose veins of left lower extremities with other complications: Secondary | ICD-10-CM | POA: Diagnosis not present

## 2020-10-14 DIAGNOSIS — I872 Venous insufficiency (chronic) (peripheral): Secondary | ICD-10-CM | POA: Diagnosis not present

## 2020-10-15 DIAGNOSIS — R300 Dysuria: Secondary | ICD-10-CM | POA: Diagnosis not present

## 2020-10-15 DIAGNOSIS — A0472 Enterocolitis due to Clostridium difficile, not specified as recurrent: Secondary | ICD-10-CM | POA: Diagnosis not present

## 2020-10-27 ENCOUNTER — Telehealth: Payer: Self-pay | Admitting: Internal Medicine

## 2020-10-27 NOTE — Telephone Encounter (Signed)
Pt called looking to make an appt for treatment of c-diff. Pt had colon in 2009 with Dr. Carlean Purl and transferred her care to [facility]. Pt did not recall being a pt of our practice and stated that [place] location was closer to her house. She wants to transfer back because she is not pleased with the care that she has received at [facility] and is actually looking to establish care with a GI in [place] since it is closer to her house. Pt is aware of the protocol to transfer back and switch MDs. Some of her records are in care everywhere, pt will drop off records not in Epic at [facility] office per her preference.

## 2020-10-31 NOTE — Telephone Encounter (Signed)
Dr. Carlean Purl, pt is requesting to switch from [facility] to [facility] because it is closer to her.   Would you approve the switch?

## 2020-10-31 NOTE — Telephone Encounter (Signed)
yes

## 2020-11-14 NOTE — Telephone Encounter (Signed)
Received records at [facility] and placed in Dr Steve Rattler bin, will await a response from him.

## 2020-11-17 NOTE — Telephone Encounter (Signed)
OK to schedule with me for OV RG

## 2020-11-18 DIAGNOSIS — I83891 Varicose veins of right lower extremities with other complications: Secondary | ICD-10-CM | POA: Diagnosis not present

## 2020-11-18 DIAGNOSIS — I872 Venous insufficiency (chronic) (peripheral): Secondary | ICD-10-CM | POA: Diagnosis not present

## 2020-11-18 DIAGNOSIS — I83892 Varicose veins of left lower extremities with other complications: Secondary | ICD-10-CM | POA: Diagnosis not present

## 2020-11-18 NOTE — Telephone Encounter (Signed)
Records placed in  Dr Leland Her file to review

## 2020-11-19 DIAGNOSIS — I80292 Phlebitis and thrombophlebitis of other deep vessels of left lower extremity: Secondary | ICD-10-CM | POA: Diagnosis not present

## 2020-11-28 DIAGNOSIS — I872 Venous insufficiency (chronic) (peripheral): Secondary | ICD-10-CM | POA: Diagnosis not present

## 2020-11-28 DIAGNOSIS — I80292 Phlebitis and thrombophlebitis of other deep vessels of left lower extremity: Secondary | ICD-10-CM | POA: Diagnosis not present

## 2020-12-16 ENCOUNTER — Telehealth: Payer: Self-pay | Admitting: Gastroenterology

## 2020-12-16 ENCOUNTER — Encounter: Payer: Self-pay | Admitting: Gastroenterology

## 2020-12-16 ENCOUNTER — Other Ambulatory Visit: Payer: Self-pay

## 2020-12-16 ENCOUNTER — Ambulatory Visit (INDEPENDENT_AMBULATORY_CARE_PROVIDER_SITE_OTHER): Payer: Medicare Other | Admitting: Gastroenterology

## 2020-12-16 VITALS — BP 122/80 | HR 88 | Wt 148.1 lb

## 2020-12-16 DIAGNOSIS — K581 Irritable bowel syndrome with constipation: Secondary | ICD-10-CM | POA: Diagnosis not present

## 2020-12-16 NOTE — Patient Instructions (Addendum)
If you are age 68 or older, your body mass index should be between 23-30. Your Body mass index is 25.43 kg/m. If this is out of the aforementioned range listed, please consider follow up with your Primary Care Provider.  If you are age 58 or younger, your body mass index should be between 19-25. Your Body mass index is 25.43 kg/m. If this is out of the aformentioned range listed, please consider follow up with your Primary Care Provider.   Please call in 3 months to schedule a follow up appointment.  You have been given a Celiac panel lab order to have drawn at your  PCP  Thank you,  Dr. Jackquline Denmark

## 2020-12-16 NOTE — Progress Notes (Signed)
Chief Complaint: To Get established  Referring Provider:  No ref. provider found      ASSESSMENT AND PLAN;   #1. C. Diff colitis (resolved after vancomycin)  #2. IBS-C. Neg colon 07/24/2015 except for div and melanosis coli. Rpt in 10 yrs. Nl TSH 09/2019  Plan: -Florastor 1 tab po QD continue for now -Celiac serology at PCP.  I am sure she will have routine blood work done as well.  She was given written instructions. -FU in 3 months. -Plenty of water. -Avoid antibiotics.   HPI:    Tiffany Bender is a 70 y.o. female  With H/O DVT on Eliquis, HLD, diverticulosis, H/O UTIs in past, situational anxiety  Dx with IBS-C with pellet like stools on colon 2009 (Dr Carlean Purl), neg colon 07/24/2015.  Recommended to repeat in 10 yrs.  Had "severe" diarrhea Sep 23, 2020, Dx with C. difficile colitis on basis of stool studies.  She was treated with Vanco 250mg  PO QID x 10 days with good relief.  She did not have any further diarrhea except last Sunday which has resolved.  She has been constipated ever since.  Has been doing well on Florastor 1/day.  No prior antibiotics or history of travel.  After diagnosis of C. difficile she apparently had UTI and was prescribed nitrofurantoin which she did not take.  She has been drinking plenty of water and has been using prune juice.  Denies having any dysuria.  Baseline: BMs 1/2-3 days with pellet-like stools x over 30-35 years, abdominal bloating, LLQ abdominal discomfort and generalized abdominal discomfort which gets better with defecation.  No diarrhea.  No melena or hematochezia.  Does have more constipation with bread. No H/O S/O lactose intolerance.   Denies having any significant upper GI symptoms including nausea, vomiting, heartburn, regurgitation, odynophagia or dysphagia.  No recent weight loss or loss of appetite.  She has appointment with family physician at [facility] to get established in coming days.  SH- adopted, went thru bad divorce  after 22yrs of 1st marriage, remarried 65yrs years ago-currently very happy, 2 childern, 2 adopted  Past GI history: -Colonoscopy 2009: Mild diverticulosis, melanosis coli. -Colonoscopy July 24, 2015 by Dr. Alvester Chou: Sigmoid and descending colonic diverticulosis, melanosis, internal hemorrhoids, otherwise normal to TI.  Recommend repeat in 10 years. Past Medical History:  Diagnosis Date  . Anemia   . Osteopenia   . Other and unspecified ovarian cysts    history    Past Surgical History:  Procedure Laterality Date  . ARTHROSCOPIC REPAIR ACL     RT knee  . ARTHROSCOPIC REPAIR ACL Right   . BUNIONECTOMY  1994   bilateral  . CARPAL TUNNEL RELEASE    . CARPAL TUNNEL RELEASE  2000   Left   . OVARIAN CYST REMOVAL  1988   left   . TONSILLECTOMY     right  . TUBAL LIGATION    . VEIN LIGATION AND STRIPPING  1981    Family History  Adopted: Yes  Problem Relation Age of Onset  . Alcohol abuse Father     Social History   Tobacco Use  . Smoking status: Former Smoker    Quit date: 08/24/1983    Years since quitting: 37.3  . Smokeless tobacco: Never Used  Substance Use Topics  . Alcohol use: Yes    Comment: socially  . Drug use: No    Current Outpatient Medications  Medication Sig Dispense Refill  . apixaban (ELIQUIS) 5 MG TABS tablet  Take 5 mg by mouth 2 (two) times daily.    . calcium-vitamin D (OSCAL) 250-125 MG-UNIT per tablet Take 1 tablet by mouth daily.    . cholecalciferol (VITAMIN D) 1000 UNITS tablet Take 1,000 Units by mouth daily.    . Multiple Vitamin (MULTIVITAMIN) tablet Take 1 tablet by mouth daily.    . Omega-3 Fatty Acids (EPA PO) Take by mouth.    . Probiotic TBEC Take 250 mg by mouth See admin instructions. florastor    . vitamin C (ASCORBIC ACID) 500 MG tablet Take 500 mg by mouth daily.    . AMBULATORY NON FORMULARY MEDICATION Medication Name: waist high/panty hose medical grade compression stocking. Pressure 20-30mg Hg pressure. Dx varicose veins, leg  pain and fatigue/. 2 Units PRN  . ascorbic acid (VITAMIN C) 500 MG tablet Take by mouth.    . Biotin 10 MG TABS 1 tablet    . Cranberry 405 MG CAPS 2 tablets     No current facility-administered medications for this visit.    Allergies  Allergen Reactions  . Amoxicillin     headache  . Ciprofloxacin Other (See Comments)    Pelvic swelling  . Statins Other (See Comments)    Dark brown urine.     Review of Systems:  Constitutional: Denies fever, chills, diaphoresis, appetite change and fatigue.  HEENT: Denies photophobia, eye pain, redness, hearing loss, ear pain, congestion, sore throat, rhinorrhea, sneezing, mouth sores, neck pain, neck stiffness and tinnitus.   Respiratory: Denies SOB, DOE, cough, chest tightness,  and wheezing.   Cardiovascular: Denies chest pain, palpitations and leg swelling.  Genitourinary: Denies dysuria, urgency, frequency, hematuria, flank pain and difficulty urinating.  Musculoskeletal: Denies myalgias, back pain, joint swelling, arthralgias and gait problem.  Skin: No rash.  Neurological: Denies dizziness, seizures, syncope, weakness, light-headedness, numbness and headaches.  Hematological: Denies adenopathy. Easy bruising, personal or family bleeding history  Psychiatric/Behavioral: Has situational anxiety     Physical Exam:    Pulse 88   Wt 148 lb 2 oz (67.2 kg)   BMI 25.43 kg/m  Wt Readings from Last 3 Encounters:  12/16/20 148 lb 2 oz (67.2 kg)  02/25/14 157 lb (71.2 kg)  02/13/14 158 lb 3.2 oz (71.8 kg)   Constitutional:  Well-developed, in no acute distress. Psychiatric: Normal mood and affect. Behavior is normal. HEENT: Pupils normal.  Conjunctivae are normal. No scleral icterus. Neck supple.  Cardiovascular: Normal rate, regular rhythm. No edema Pulmonary/chest: Effort normal and breath sounds normal. No wheezing, rales or rhonchi. Abdominal: Soft, nondistended. Nontender. Bowel sounds active throughout. There are no masses  palpable. No hepatomegaly. Rectal: Deferred Neurological: Alert and oriented to person place and time. Skin: Skin is warm and dry. No rashes noted.  Data Reviewed: I have personally reviewed following labs and imaging studies  CBC: CBC Latest Ref Rng & Units 08/13/2013 07/10/2013 12/27/2012  WBC 4.0 - 10.5 K/uL 7.3 8.1 8.2  Hemoglobin 12.0 - 15.0 g/dL 13.2 14.5 13.8  Hematocrit 36.0 - 46.0 % 38.3 41.0 39.8  Platelets 150 - 400 K/uL 282 315 291    CMP: CMP Latest Ref Rng & Units 11/01/2013 08/09/2013 07/10/2013  Glucose 70 - 99 mg/dL - - 78  BUN 6 - 23 mg/dL - - 10  Creatinine 0.50 - 1.10 mg/dL - - 0.78  Sodium 135 - 145 mEq/L - - 142  Potassium 3.5 - 5.3 mEq/L - - 4.6  Chloride 96 - 112 mEq/L - - 104  CO2 19 - 32  mEq/L - - 24  Calcium 8.4 - 10.5 mg/dL - - 9.7  Total Protein 6.0 - 8.3 g/dL 6.4 6.3 6.6  Total Bilirubin 0.2 - 1.2 mg/dL 0.5 0.6 0.6  Alkaline Phos 39 - 117 U/L 70 78 93  AST 0 - 37 U/L 21 29 27   ALT 0 - 35 U/L 32 61(H) 46(H)      Carmell Austria, MD 12/16/2020, 2:57 PM  Cc: No ref. provider found

## 2020-12-16 NOTE — Telephone Encounter (Signed)
Pt scheduled to see Dr. Lyndel Safe today at 2:50pm. Pt aware of appt.

## 2020-12-16 NOTE — Telephone Encounter (Signed)
Patient called states she feels like she has C-diff symptoms yesterday she had diarrhea but she is fine but has her stomach really bloating\swollen seeking advise until her appointment. Does not have a PCP at the moment and asked since she has seen Dr. Carlean Purl before if someone can please triage the call.

## 2020-12-18 DIAGNOSIS — I824Y2 Acute embolism and thrombosis of unspecified deep veins of left proximal lower extremity: Secondary | ICD-10-CM | POA: Diagnosis not present

## 2020-12-18 DIAGNOSIS — I83893 Varicose veins of bilateral lower extremities with other complications: Secondary | ICD-10-CM | POA: Diagnosis not present

## 2020-12-18 DIAGNOSIS — K581 Irritable bowel syndrome with constipation: Secondary | ICD-10-CM | POA: Diagnosis not present

## 2020-12-18 DIAGNOSIS — Z1231 Encounter for screening mammogram for malignant neoplasm of breast: Secondary | ICD-10-CM | POA: Diagnosis not present

## 2020-12-22 DIAGNOSIS — K581 Irritable bowel syndrome with constipation: Secondary | ICD-10-CM | POA: Diagnosis not present

## 2020-12-24 DIAGNOSIS — R600 Localized edema: Secondary | ICD-10-CM | POA: Diagnosis not present

## 2020-12-24 DIAGNOSIS — I82462 Acute embolism and thrombosis of left calf muscular vein: Secondary | ICD-10-CM | POA: Diagnosis not present

## 2020-12-24 DIAGNOSIS — I83812 Varicose veins of left lower extremities with pain: Secondary | ICD-10-CM | POA: Diagnosis not present

## 2020-12-24 LAB — CELIAC PANEL 10
Antigliadin Abs, IgA: 2 units (ref 0–19)
Endomysial IgA: NEGATIVE
Gliadin IgG: 1 units (ref 0–19)
IgA/Immunoglobulin A, Serum: 83 mg/dL — ABNORMAL LOW (ref 87–352)
Tissue Transglut Ab: <2 U/mL (ref 0–5)
Transglutaminase IgA: <2 U/mL (ref 0–3)

## 2020-12-25 ENCOUNTER — Telehealth: Payer: Self-pay | Admitting: Gastroenterology

## 2020-12-25 DIAGNOSIS — I82462 Acute embolism and thrombosis of left calf muscular vein: Secondary | ICD-10-CM | POA: Diagnosis not present

## 2020-12-25 DIAGNOSIS — R600 Localized edema: Secondary | ICD-10-CM | POA: Diagnosis not present

## 2020-12-25 DIAGNOSIS — I83812 Varicose veins of left lower extremities with pain: Secondary | ICD-10-CM | POA: Diagnosis not present

## 2020-12-25 NOTE — Telephone Encounter (Signed)
Inbound call from patient requesting her lab results please.

## 2020-12-25 NOTE — Telephone Encounter (Signed)
Pt calling for lab results. Please advise. 

## 2020-12-29 ENCOUNTER — Ambulatory Visit: Payer: BC Managed Care – PPO | Admitting: Gastroenterology

## 2020-12-29 DIAGNOSIS — Z1231 Encounter for screening mammogram for malignant neoplasm of breast: Secondary | ICD-10-CM | POA: Diagnosis not present

## 2020-12-30 LAB — HM MAMMOGRAPHY

## 2021-01-06 ENCOUNTER — Telehealth: Payer: Self-pay | Admitting: Gastroenterology

## 2021-01-06 NOTE — Telephone Encounter (Signed)
Patient has 8 days left of the florastor probiotic and said that she is on a blood thinner due to her hx of blood clots but since the end of February her bowel habits have been about every other day or she could go more than once a day but they aren't everyday and she would like to see if she could have different probiotic prescribed because she doesn'tt feel like the florastor is helping

## 2021-01-06 NOTE — Telephone Encounter (Signed)
Once she finishes Florastor, can stop probiotics Lets see how she does without She can take [place] or just yogurt every day RG

## 2021-01-06 NOTE — Telephone Encounter (Signed)
Inbound call from patient requesting a call back please in regards to probiotic medication she is taking.

## 2021-01-07 NOTE — Telephone Encounter (Signed)
LVM for patient to call back. ?

## 2021-01-07 NOTE — Telephone Encounter (Signed)
Patient called back requesting to be called after 2pm please.

## 2021-01-07 NOTE — Telephone Encounter (Signed)
Called patient and she is still requesting a probiotic although you said that she can stop taking probiotic after she finshes and just try yogurt. She says that she eats yogurt in the morning and she said that since taking eliquis the probiotic seems to not work like it and she said that her last GI doctor prescribe it so she wanted another one. Patient mentions last night her abdominal area was hard and she had extreme bloating and it has somewhat start to go down but she proceeded to mention her eliquis and that she thinks it is causing her to have bm that aren't her usual. I told her that as of right now your recommendation is that she finish the probiotic and then stop and try to see without it and to call in 2 weeks and I told her that if she keeps contributing her causes to her blood thinner then she should reach out to the doctor that prescribed it and tell them that she has told her GI doctor about her issues and wants to let the other doctor know about the issues that are going on since she is linking her blood thinner with her issues since each time she talks about her bm just to keep her other doctor informed. But she still wants to see about a probiotic. Please advise

## 2021-01-08 NOTE — Telephone Encounter (Signed)
No problems Unfortunately, there are no prescription probiotics (since they have become over-the-counter) Lets try VSL-3 1 po qd. She has to ask the pharmacist for that (it is refrigerated and kept behind the counter) RG

## 2021-01-09 NOTE — Telephone Encounter (Signed)
Patient is made aware.

## 2021-01-26 DIAGNOSIS — M5136 Other intervertebral disc degeneration, lumbar region: Secondary | ICD-10-CM | POA: Diagnosis not present

## 2021-01-26 DIAGNOSIS — M7542 Impingement syndrome of left shoulder: Secondary | ICD-10-CM | POA: Diagnosis not present

## 2021-01-26 DIAGNOSIS — M4186 Other forms of scoliosis, lumbar region: Secondary | ICD-10-CM | POA: Diagnosis not present

## 2021-01-26 DIAGNOSIS — M5441 Lumbago with sciatica, right side: Secondary | ICD-10-CM | POA: Diagnosis not present

## 2021-01-26 DIAGNOSIS — G8929 Other chronic pain: Secondary | ICD-10-CM | POA: Diagnosis not present

## 2021-01-26 DIAGNOSIS — M545 Low back pain, unspecified: Secondary | ICD-10-CM | POA: Diagnosis not present

## 2021-01-26 DIAGNOSIS — M5412 Radiculopathy, cervical region: Secondary | ICD-10-CM | POA: Diagnosis not present

## 2021-01-26 DIAGNOSIS — M79661 Pain in right lower leg: Secondary | ICD-10-CM | POA: Diagnosis not present

## 2021-01-26 DIAGNOSIS — Z86718 Personal history of other venous thrombosis and embolism: Secondary | ICD-10-CM | POA: Diagnosis not present

## 2021-01-26 DIAGNOSIS — M5137 Other intervertebral disc degeneration, lumbosacral region: Secondary | ICD-10-CM | POA: Diagnosis not present

## 2021-02-03 DIAGNOSIS — I83812 Varicose veins of left lower extremities with pain: Secondary | ICD-10-CM | POA: Diagnosis not present

## 2021-02-03 DIAGNOSIS — R600 Localized edema: Secondary | ICD-10-CM | POA: Diagnosis not present

## 2021-02-03 DIAGNOSIS — I82462 Acute embolism and thrombosis of left calf muscular vein: Secondary | ICD-10-CM | POA: Diagnosis not present

## 2021-02-05 DIAGNOSIS — I82562 Chronic embolism and thrombosis of left calf muscular vein: Secondary | ICD-10-CM | POA: Diagnosis not present

## 2021-02-11 DIAGNOSIS — M25512 Pain in left shoulder: Secondary | ICD-10-CM | POA: Diagnosis not present

## 2021-02-12 ENCOUNTER — Other Ambulatory Visit: Payer: Self-pay | Admitting: Orthopedic Surgery

## 2021-02-12 DIAGNOSIS — M25512 Pain in left shoulder: Secondary | ICD-10-CM

## 2021-02-16 DIAGNOSIS — I872 Venous insufficiency (chronic) (peripheral): Secondary | ICD-10-CM | POA: Insufficient documentation

## 2021-02-17 ENCOUNTER — Encounter: Payer: Self-pay | Admitting: Family Medicine

## 2021-02-17 ENCOUNTER — Other Ambulatory Visit: Payer: Self-pay

## 2021-02-17 ENCOUNTER — Ambulatory Visit (INDEPENDENT_AMBULATORY_CARE_PROVIDER_SITE_OTHER): Payer: Medicare Other | Admitting: Family Medicine

## 2021-02-17 VITALS — BP 125/74 | HR 83 | Temp 98.6°F | Ht 63.0 in | Wt 149.0 lb

## 2021-02-17 DIAGNOSIS — Z789 Other specified health status: Secondary | ICD-10-CM | POA: Diagnosis not present

## 2021-02-17 DIAGNOSIS — I82562 Chronic embolism and thrombosis of left calf muscular vein: Secondary | ICD-10-CM | POA: Diagnosis not present

## 2021-02-17 DIAGNOSIS — E782 Mixed hyperlipidemia: Secondary | ICD-10-CM

## 2021-02-17 DIAGNOSIS — Z7689 Persons encountering health services in other specified circumstances: Secondary | ICD-10-CM

## 2021-02-17 NOTE — Progress Notes (Signed)
Patient ID: Tiffany Bender, female  DOB: Mar 11, 1951, 70 y.o.   MRN: [id number] Patient Care Team    Relationship Specialty Notifications Start End  Ma Hillock, DO PCP - General Family Medicine  02/17/21     Chief Complaint  Patient presents with   Establish Care   Hyperlipidemia    CMC;    Subjective: Tiffany Bender is a 70 y.o.  female present for new patient establishment. All past medical history, surgical history, allergies, family history, immunizations, medications and social history were updated in the electronic medical record today. All recent labs, ED visits and hospitalizations within the last year were reviewed.  Patient presents today for new patient establishment.  She reports she wanted to see if this provider location was a good fit for her today.  She has a history of hyperlipidemia, osteopenia, frequent UTI, DVT and recent C. difficile in January 2022.  She reports a left shoulder injury that she is following with Ortho and has had an MRI after last week.  She reports she likes to take a more holistic/natural approach to her health.  She likes to avoid medications if at all possible.    Depression screen [facility] 2/9 02/17/2021 05/31/2013  Decreased Interest 0 2  Down, Depressed, Hopeless 0 1  PHQ - 2 Score 0 3  Altered sleeping - 1  Tired, decreased energy - 3  Change in appetite - 3  Feeling bad or failure about yourself  - 0  Trouble concentrating - 1  Moving slowly or fidgety/restless - 0  Suicidal thoughts - 0  PHQ-9 Score - 11   No flowsheet data found.        Fall Risk  02/17/2021  Falls in the past year? 0  Number falls in past yr: 0  Injury with Fall? 0  Follow up Falls evaluation completed     Immunization History  Administered Date(s) Administered   Influenza Split 07/29/2011, 08/11/2012   Influenza Whole 06/23/2005, 06/26/2007   Influenza-Unspecified 06/08/2013   Td 06/23/2004   Tdap 05/19/2012    No results  found.  Past Medical History:  Diagnosis Date   DDD (degenerative disc disease), lumbar    Dyspareunia in female 08/21/2015   KNEE PAIN, RIGHT 07/20/2006   Qualifier: Diagnosis of  By: Madilyn Fireman MD, Catherine     LIVER FUNCTION TESTS, ABNORMAL 09/28/2007   Qualifier: Diagnosis of  By: Wynetta Emery LPN, Kim     Neck pain    Osteopenia    Other and unspecified ovarian cysts    history   Allergies  Allergen Reactions   Ciprofloxacin Other (See Comments) and Swelling    Pelvic swelling Other reaction(s): Other Swelling genitalia, high fever. Went to ED     Fenofibrate Other (See Comments)   Morphine Rash   Penicillins Other (See Comments)    Head ache. rash Headaches.     Statins Other (See Comments)    Dark brown urine.  Dark unrine Other reaction(s): Other Dark brown urine.  Urine got really dark Has tried Crestor and Lipitor    Sulfamethoxazole-Trimethoprim Other (See Comments)    Passed out, Eyes Swollen   Tizanidine Hcl Other (See Comments)    Dizzy, weak, throat swelling    Amoxicillin     headache   Dexamethasone Nausea Only    dizzy   Past Surgical History:  Procedure Laterality Date   ARTHROSCOPIC REPAIR ACL     RT knee   BUNIONECTOMY  08/23/1992   bilateral   CARPAL TUNNEL RELEASE Right    CARPAL TUNNEL RELEASE  08/23/1998   Left    OVARIAN CYST REMOVAL  08/23/1986   left    TONSILLECTOMY     right   TUBAL LIGATION     VEIN LIGATION AND STRIPPING  08/24/1979   Family History  Adopted: Yes  Problem Relation Age of Onset   Alcohol abuse Father    Social History   Social History Narrative   No regular exercise. 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 She was adopted at age 24. She does drink coffee daily. She has 2 biological children and 2 adopted children.    Allergies as of 02/17/2021       Reactions   Ciprofloxacin Other (See Comments), Swelling   Pelvic swelling Other reaction(s): Other Swelling genitalia, high fever. Went to  ED    Fenofibrate Other (See Comments)   Morphine Rash   Penicillins Other (See Comments)   Head ache. rash Headaches.    Statins Other (See Comments)   Dark brown urine.  Dark unrine Other reaction(s): Other Dark brown urine.  Urine got really dark Has tried Crestor and Lipitor    Sulfamethoxazole-trimethoprim Other (See Comments)   Passed out, Eyes Swollen   Tizanidine Hcl Other (See Comments)   Dizzy, weak, throat swelling    Amoxicillin    headache   Dexamethasone Nausea Only   dizzy        Medication List        Accurate as of February 17, 2021 11:59 PM. If you have any questions, ask your nurse or doctor.          STOP taking these medications    calcium-vitamin D 250-125 MG-UNIT tablet Commonly known as: OSCAL Stopped by: Howard Pouch, DO   Eliquis 5 MG Tabs tablet Generic drug: apixaban Stopped by: Howard Pouch, DO   EPA PO Stopped by: Howard Pouch, DO   multivitamin tablet Stopped by: Howard Pouch, DO   naproxen sodium 220 MG tablet Commonly known as: ALEVE Stopped by: Howard Pouch, DO       TAKE these medications    AMBULATORY NON FORMULARY MEDICATION Medication Name: waist high/panty hose medical grade compression stocking. Pressure 20-30mg Hg pressure. Dx varicose veins, leg pain and fatigue/.   ascorbic acid 500 MG tablet Commonly known as: VITAMIN C Take by mouth. What changed: Another medication with the same name was removed. Continue taking this medication, and follow the directions you see here. Changed by: Howard Pouch, DO   aspirin EC 81 MG tablet Take 81 mg by mouth daily. Swallow whole.   Biotin 5000 MCG Tabs Take by mouth. What changed: Another medication with the same name was removed. Continue taking this medication, and follow the directions you see here. Changed by: Howard Pouch, DO   BORON PO Take 3 mg by mouth.   Cholecalciferol 125 MCG (5000 UT) Tabs Take 1 tablet by mouth daily. What changed: Another  medication with the same name was removed. Continue taking this medication, and follow the directions you see here. Changed by: Howard Pouch, DO   Cranberry 405 MG Caps 870 mg. What changed: Another medication with the same name was removed. Continue taking this medication, and follow the directions you see here. Changed by: Howard Pouch, DO   Probiotic Tbec Take 250 mg by mouth See admin instructions. florastor   vitamin E 180 MG (400 UNITS) capsule Generic drug: vitamin E Take  400 Units by mouth daily.        All past medical history, surgical history, allergies, family history, immunizations andmedications were updated in the EMR today and reviewed under the history and medication portions of their EMR.    Recent Results (from the past 2160 hour(s))  Celiac panel 10     Status: Abnormal   Collection Time: 12/22/20  3:16 PM  Result Value Ref Range   Antigliadin Abs, IgA 2 0 - 19 units    Comment:                    Negative                   0 - 19                    Weak Positive             20 - 30                    Moderate to Strong Positive   >30    Gliadin IgG 1 0 - 19 units    Comment:                    Negative                   0 - 19                    Weak Positive             20 - 30                    Moderate to Strong Positive   >30    Transglutaminase IgA <2 0 - 3 U/mL    Comment:                               Negative        0 -  3                               Weak Positive   4 - 10                               Positive           >10  Tissue Transglutaminase (tTG) has been identified  as the endomysial antigen.  Studies have demonstr-  ated that endomysial IgA antibodies have over 99%  specificity for gluten sensitive enteropathy.    Tissue Transglut Ab <2 0 - 5 U/mL    Comment:                               Negative        0 - 5                               Weak Positive   6 - 9                               Positive           >  9    Endomysial  IgA Negative Negative   IgA/Immunoglobulin A, Serum 83 (L) 87 - 352 mg/dL    No results found.   ROS: 14 pt review of systems performed and negative (unless mentioned in an HPI)  Objective: BP 125/74   Pulse 83   Temp 98.6 F (37 C) (Oral)   Ht 5\' 3"  (1.6 m)   Wt 149 lb (67.6 kg)   SpO2 98%   BMI 26.39 kg/m  Gen: Afebrile. No acute distress. Nontoxic in appearance, well-developed, well-nourished, pleasant female HENT: AT. [place].  No cough.  No hoarseness.   Eyes:Pupils Equal Round Reactive to light, Extraocular movements intact,  Conjunctiva without redness, discharge or icterus.  or guarding. Neuro/Msk: Normal gait.  Alert. Oriented x3.   Psych: Normal affect, dress and demeanor. Normal speech. Normal thought content and judgment.   Assessment/plan: Markella Dao is a 70 y.o. female present for establish care Mixed hyperlipidemia/statin intolerance Patient has a history of hyperlipidemia with statin intolerance.  Briefly discussed repatha with her today.  She will consider.  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  Had been placed on blood thinner and now aspirin only.  Follow-up as needed   No follow-ups on file.  No orders of the defined types were placed in this encounter.  No orders of the defined types were placed in this encounter.  Referral Orders  No referral(s) requested today     Note is dictated utilizing voice recognition software. Although note has been proof read prior to signing, occasional typographical errors still can be missed. If any questions arise, please do not hesitate to call for verification.  Electronically signed by: Howard Pouch, DO [place]

## 2021-02-17 NOTE — Patient Instructions (Addendum)
Great to see you today.   Follow yearly for medicare wellness visit- chronic conditions.    Please help [place] help you:  We are honored you have chosen [facility] for your Primary Care home. Below you will find basic instructions that you may need to access in the future. Please help [place] help you by reading the instructions, which cover many of the frequent questions we experience.   Prescription refills and request:  -In order to allow more efficient response time, please call your pharmacy for all refills. They will forward the request electronically to [place]. This allows for the quickest possible response. Request left on a nurse line can take longer to refill, since these are checked as time allows between office patients and other phone calls.  - refill request can take up to 3-5 working days to complete.  - If request is sent electronically and request is appropiate, it is usually completed in 1-2 business days.  - all patients will need to be seen routinely for all chronic medical conditions requiring prescription medications (see follow-up below). If you are overdue for follow up on your condition, you will be asked to make an appointment and we will call in enough medication to cover you until your appointment (up to 30 days).  - all controlled substances will require a face to face visit to request/refill.  - if you desire your prescriptions to go through a new pharmacy, and have an active script at original pharmacy, you will need to call your pharmacy and have scripts transferred to new pharmacy. This is completed between the pharmacy locations and not by your provider.    Results: Our office handles many outgoing and incoming calls daily. If we have not contacted you within 1 week about your results, please check your mychart to see if there is a message first and if not, then contact our office.  In helping with this matter, you help decrease call volume, and therefore allow [place] to be  able to respond to patients needs more efficiently.  We will always attempt to call you with results,  normal or abnormal. However, if we are unable to reach you we will send a message in your my chart with results.   Acute office visits (sick visit):  An acute visit is intended for a new problem and are scheduled in shorter time slots to allow schedule openings for patients with new problems. This is the appropriate visit to discuss a new problem. Problems will not be addressed by phone call or Echart message. Appointment is needed if requesting treatment. In order to provide you with excellent quality medical care with proper time for you to explain your problem, have an exam and receive treatment with instructions, these appointments should be limited to one new problem per visit. If you experience a new problem, in which you desire to be addressed, please make an acute office visit, we save openings on the schedule to accommodate you. Please do not save your new problem for any other type of visit, let us take care of it properly and quickly for you.   Follow up visits:  Depending on your condition(s) your provider will need to see you routinely in order to provide you with quality care and prescribe medication(s). Most chronic conditions (Example: hypertension, Diabetes, depression/anxiety... etc), require visits a couple times a year. Your provider will instruct you on proper follow up for your personal medical conditions and history. Please make certain to make  follow up appointments for your condition as instructed. Failing to do so could result in lapse in your medication treatment/refills. If you request a refill, and are overdue to be seen on a condition, we will always provide you with a 30 day script (once) to allow you time to schedule.    Medicare wellness (well visit): - we have a wonderful Nurse Maudie Mercury), that will meet with you and provide you will yearly medicare wellness visits. These  visits should occur yearly (can not be scheduled less than 1 calendar year apart) and cover preventive health, immunizations, advance directives and screenings you are entitled to yearly through your medicare benefits. Do not miss out on your entitled benefits, this is when medicare will pay for these benefits to be ordered for you.  These are strongly encouraged by your provider and is the appropriate type of visit to make certain you are up to date with all preventive health benefits. If you have not had your medicare wellness exam in the last 12 months, please make certain to schedule one by calling the office and schedule your medicare wellness with Maudie Mercury as soon as possible.   Yearly physical (well visit):  - Adults are recommended to be seen yearly for physicals. Check with your insurance and date of your last physical, most insurances require one calendar year between physicals. Physicals include all preventive health topics, screenings, medical exam and labs that are appropriate for gender/age and history. You may have fasting labs needed at this visit. This is a well visit (not a sick visit), new problems should not be covered during this visit (see acute visit).  - Pediatric patients are seen more frequently when they are younger. Your provider will advise you on well child visit timing that is appropriate for your their age. - This is not a medicare wellness visit. Medicare wellness exams do not have an exam portion to the visit. Some medicare companies allow for a physical, some do not allow a yearly physical. If your medicare allows a yearly physical you can schedule the medicare wellness with our nurse Maudie Mercury and have your physical with your provider after, on the same day. Please check with insurance for your full benefits.   Late Policy/No Shows:  - all new patients should arrive 15-30 minutes earlier than appointment to allow [place] time  to  obtain all personal demographics,  insurance information  and for you to complete office paperwork. - All established patients should arrive 10-15 minutes earlier than appointment time to update all information and be checked in .  - In our best efforts to run on time, if you are late for your appointment you will be asked to either reschedule or if able, we will work you back into the schedule. There will be a wait time to work you back in the schedule,  depending on availability.  - If you are unable to make it to your appointment as scheduled, please call 24 hours ahead of time to allow [place] to fill the time slot with someone else who needs to be seen. If you do not cancel your appointment ahead of time, you may be charged a no show fee.

## 2021-02-20 ENCOUNTER — Ambulatory Visit
Admission: RE | Admit: 2021-02-20 | Discharge: 2021-02-20 | Disposition: A | Discharge status: Home / Self Care | Payer: Medicare Other | Source: Ambulatory Visit | Attending: Orthopedic Surgery | Admitting: Orthopedic Surgery

## 2021-02-20 ENCOUNTER — Other Ambulatory Visit: Payer: Self-pay

## 2021-02-20 DIAGNOSIS — M25412 Effusion, left shoulder: Secondary | ICD-10-CM | POA: Diagnosis not present

## 2021-02-20 DIAGNOSIS — M25512 Pain in left shoulder: Secondary | ICD-10-CM

## 2021-02-20 DIAGNOSIS — M65812 Other synovitis and tenosynovitis, left shoulder: Secondary | ICD-10-CM | POA: Diagnosis not present

## 2021-02-20 DIAGNOSIS — M19012 Primary osteoarthritis, left shoulder: Secondary | ICD-10-CM | POA: Diagnosis not present

## 2021-02-20 DIAGNOSIS — S46012A Strain of muscle(s) and tendon(s) of the rotator cuff of left shoulder, initial encounter: Secondary | ICD-10-CM | POA: Diagnosis not present

## 2021-02-24 ENCOUNTER — Encounter: Payer: Self-pay | Admitting: Family Medicine

## 2021-02-24 DIAGNOSIS — Z789 Other specified health status: Secondary | ICD-10-CM | POA: Insufficient documentation

## 2021-02-25 DIAGNOSIS — E7849 Other hyperlipidemia: Secondary | ICD-10-CM | POA: Diagnosis not present

## 2021-03-04 DIAGNOSIS — M67912 Unspecified disorder of synovium and tendon, left shoulder: Secondary | ICD-10-CM | POA: Diagnosis not present

## 2021-03-09 ENCOUNTER — Telehealth: Payer: Self-pay

## 2021-03-09 ENCOUNTER — Telehealth: Payer: Self-pay | Admitting: Family Medicine

## 2021-03-09 NOTE — Telephone Encounter (Signed)
Please see other encounter.

## 2021-03-09 NOTE — Telephone Encounter (Signed)
Patient calling regarding MRI results and records from Dr. Tamera Punt that she dropped off last week.  Please call [phone].

## 2021-03-09 NOTE — Telephone Encounter (Signed)
Spoke with pt regarding results and instructions. Pt has CPE and would like to have examine during that appt

## 2021-03-09 NOTE — Telephone Encounter (Signed)
Please inform patient I reviewed the imaging study she dropped off that was obtained by other providers. There was some concern by patient for visualized mildly prominent left axillary lymph nodes visualized on her MRI of her shoulder. Please inform patient lymph nodes can be mildly enlarged for many reasons, including immunizations, recent illnesses, insect bites etc.  Mammogram in May is reassuring.  However I would recommend her to follow-up with this provider in 4 weeks for recheck.

## 2021-03-10 DIAGNOSIS — M25612 Stiffness of left shoulder, not elsewhere classified: Secondary | ICD-10-CM | POA: Diagnosis not present

## 2021-03-10 DIAGNOSIS — M75112 Incomplete rotator cuff tear or rupture of left shoulder, not specified as traumatic: Secondary | ICD-10-CM | POA: Diagnosis not present

## 2021-03-11 ENCOUNTER — Ambulatory Visit (INDEPENDENT_AMBULATORY_CARE_PROVIDER_SITE_OTHER): Payer: Medicare Other | Admitting: Family Medicine

## 2021-03-11 ENCOUNTER — Encounter: Payer: Self-pay | Admitting: Family Medicine

## 2021-03-11 ENCOUNTER — Other Ambulatory Visit: Payer: Self-pay

## 2021-03-11 VITALS — BP 131/73 | HR 65 | Temp 98.3°F | Ht 63.0 in | Wt 146.0 lb

## 2021-03-11 DIAGNOSIS — R590 Localized enlarged lymph nodes: Secondary | ICD-10-CM | POA: Diagnosis not present

## 2021-03-11 NOTE — Progress Notes (Signed)
This visit occurred during the SARS-CoV-2 public health emergency.  Safety protocols were in place, including screening questions prior to the visit, additional usage of staff PPE, and extensive cleaning of exam room while observing appropriate contact time as indicated for disinfecting solutions.    Patient ID: Tiffany Bender, female  DOB: [birth year], 70 y.o.   MRN: [id number] Patient Care Team    Relationship Specialty Notifications Start End  Ma Hillock, DO PCP - General Family Medicine  02/17/21     Chief Complaint  Patient presents with   Cyst    Pt c/o pain under L arm with swollen lymph node that was noted on last MRI; pt is expressing her concerns for this issue     Subjective: Tiffany Bender is a 70 y.o.  Female  present for enlarged lymph nodes under the left arm.  Patient reports she has had shoulder pain since 2020 and she assumed the discomfort she was feeling in her armpit was associated with her shoulder.  She does endorse having tenderness to palpation in her left axilla.  She reports this tenderness has been present at least since the beginning of the year.  She had a normal mammogram in May 2022.  She denies any recent illness, skin infections, respiratory infection or immunizations.  She reports she performs breast exams and has not felt an abnormality.  Depression screen [facility] 2/9 02/17/2021 05/31/2013  Decreased Interest 0 2  Down, Depressed, Hopeless 0 1  PHQ - 2 Score 0 3  Altered sleeping - 1  Tired, decreased energy - 3  Change in appetite - 3  Feeling bad or failure about yourself  - 0  Trouble concentrating - 1  Moving slowly or fidgety/restless - 0  Suicidal thoughts - 0  PHQ-9 Score - 11   No flowsheet data found.   Immunization History  Administered Date(s) Administered   Influenza Split 07/29/2011, 08/11/2012   Influenza Whole 06/23/2005, 06/26/2007   Influenza-Unspecified 06/08/2013   Td 06/23/2004   Tdap 05/19/2012     Past  Medical History:  Diagnosis Date   DDD (degenerative disc disease), lumbar    Dyspareunia in female 08/21/2015   KNEE PAIN, RIGHT 07/20/2006   Qualifier: Diagnosis of  By: Madilyn Fireman MD, Catherine     LIVER FUNCTION TESTS, ABNORMAL 09/28/2007   Qualifier: Diagnosis of  By: Wynetta Emery LPN, Kim     Neck pain    Osteopenia    Other and unspecified ovarian cysts    history   Allergies  Allergen Reactions   Ciprofloxacin Other (See Comments) and Swelling    Pelvic swelling Other reaction(s): Other Swelling genitalia, high fever. Went to ED     Fenofibrate Other (See Comments)   Morphine Rash   Penicillins Other (See Comments)    Head ache. rash Headaches.     Statins Other (See Comments)    Dark brown urine.  Dark unrine Other reaction(s): Other Dark brown urine.  Urine got really dark Has tried Crestor and Lipitor    Sulfamethoxazole-Trimethoprim Other (See Comments)    Passed out, Eyes Swollen   Tizanidine Hcl Other (See Comments)    Dizzy, weak, throat swelling    Amoxicillin     headache   Dexamethasone Nausea Only    dizzy   Past Surgical History:  Procedure Laterality Date   ARTHROSCOPIC REPAIR ACL     RT knee   BUNIONECTOMY  08/23/1992   bilateral   CARPAL TUNNEL RELEASE Right  CARPAL TUNNEL RELEASE  08/23/1998   Left    OVARIAN CYST REMOVAL  08/23/1986   left    TONSILLECTOMY     right   TUBAL LIGATION     VEIN LIGATION AND STRIPPING  08/24/1979   Family History  Adopted: Yes  Problem Relation Age of Onset   Alcohol abuse Father    Social History   Social History Narrative   No regular exercise. 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 She was adopted at age 40. She does drink coffee daily. She has 2 biological children and 2 adopted children.    Allergies as of 03/11/2021       Reactions   Ciprofloxacin Other (See Comments), Swelling   Pelvic swelling Other reaction(s): Other Swelling genitalia, high fever. Went to ED     Fenofibrate Other (See Comments)   Morphine Rash   Penicillins Other (See Comments)   Head ache. rash Headaches.    Statins Other (See Comments)   Dark brown urine.  Dark unrine Other reaction(s): Other Dark brown urine.  Urine got really dark Has tried Crestor and Lipitor    Sulfamethoxazole-trimethoprim Other (See Comments)   Passed out, Eyes Swollen   Tizanidine Hcl Other (See Comments)   Dizzy, weak, throat swelling    Amoxicillin    headache   Dexamethasone Nausea Only   dizzy        Medication List        Accurate as of March 11, 2021  1:29 PM. If you have any questions, ask your nurse or doctor.          AMBULATORY NON FORMULARY MEDICATION Medication Name: waist high/panty hose medical grade compression stocking. Pressure 20-30mg Hg pressure. Dx varicose veins, leg pain and fatigue/.   ascorbic acid 500 MG tablet Commonly known as: VITAMIN C Take by mouth.   aspirin EC 81 MG tablet Take 81 mg by mouth daily. Swallow whole.   Biotin 5000 MCG Tabs Take by mouth.   BORON PO Take 3 mg by mouth.   Cholecalciferol 125 MCG (5000 UT) Tabs Take 1 tablet by mouth daily.   Cranberry 405 MG Caps 870 mg.   Probiotic Tbec Take 250 mg by mouth See admin instructions. florastor   vitamin E 180 MG (400 UNITS) capsule Take 400 Units by mouth daily.        All past medical history, surgical history, allergies, family history, immunizations andmedications were updated in the EMR today and reviewed under the history and medication portions of their EMR.       MR SHOULDER LEFT WO CONTRAST Result Date: 02/20/2021 CLINICAL DATA:  Left shoulder pain after dog walking injury IMPRESSION: 1. Mild subscapularis tendinosis with partial-thickness insertional tears of the distal tendon. 2. Minimal tendinosis of the distal supraspinatus and infraspinatus tendons without tear. 3. Intra-articular biceps tendinosis with mild tenosynovitis. 4. Labral degeneration with probable  posteroinferior labral tear. 5. Mild AC joint arthropathy. 6. Multiple mildly prominent left axillary lymph nodes, nonspecific. Correlation with recent mammogram is recommended. Electronically Signed   By: Davina Poke D.O.   On: 02/20/2021 14:51     ROS: 14 pt review of systems performed and negative (unless mentioned in an HPI)  Objective: BP 131/73   Pulse 65   Temp 98.3 F (36.8 C) (Oral)   Ht 5\' 3"  (1.6 m)   Wt 146 lb (66.2 kg)   SpO2 98%   BMI 25.86 kg/m  Gen: Afebrile. No acute distress. Nontoxic  in appearance, well-developed, well-nourished,  very pleasant female.  HENT: AT. [place]. Bilateral TM visualized and normal in appearance, normal external auditory canal. MMM, no oral lesions, adequate dentition.  No cough on exam, no hoarseness on exam. Eyes:Pupils Equal Round Reactive to light, Extraocular movements intact,  Conjunctiva without redness, discharge or icterus. Neck/lymp/endocrine: Supple, no cervical lymphadenopathy, possible enlarged lymph node palpated mid left axillary region.  She is tender over this location and inferior aspect of axilla. Chest: CTAB Skin: no rashes, purpura or petechiae. Warm and well-perfused. Skin intact. Psych: Normal affect, dress and demeanor. Normal speech. Normal thought content and judgment. Breasts: breasts appear normal, symmetrical, ttp over lateral aspect of left breast with increased density at this location, no discrete mass noted.  no suspicious masses, no skin or nipple changes.  No results found.  Assessment/plan: Tiffany Bender is a 70 y.o. female present for  Enlarged lymph nodes in armpit Patient is tender mid axillary with possibly some enlarged lymph nodes at this location.  She is also tender in the left inferior axillary region. She does have density in the left lateral breast margin with tenderness over this location as well.  She does drink quite a bit of caffeine a day and may be related to fibrocystic  densities. Mammogram was reassuring in May 2022.  No family of breast cancer present or lymphoma. Recent lab work including CBC was unremarkable ([facility] system). She also has not had any recent illness, skin infections, insect bites or immunizations to contribute to enlarged nodules. Discussed evaluating lymph nodes with ultrasound as first step.  Patient is agreeable to this plan. - [place] AXILLA LEFT; Future Follow-up dependent upon ultrasound results   No follow-ups on file.  Orders Placed This Encounter  Procedures   [place] AXILLA LEFT    Orders Placed This Encounter  Procedures   [place] AXILLA LEFT   No orders of the defined types were placed in this encounter.  Referral Orders  No referral(s) requested today     Electronically signed by: Howard Pouch, [place]

## 2021-03-11 NOTE — Patient Instructions (Signed)
Lymphadenopathy  Lymphadenopathy means that your lymph glands are swollen or larger than normal. Lymph glands, also called lymph nodes, are collections of tissue that filter excess fluid, bacteria, viruses, and waste from your bloodstream. They are part of your body's disease-fighting system (immune system), which protects your body from germs. There may be different causes of lymphadenopathy, depending on where it is in your body. Some types go away on their own. Lymphadenopathy can occur anywhere that you have lymph glands, including these areas: Neck (cervical lymphadenopathy). Chest (mediastinal lymphadenopathy). Lungs (hilar lymphadenopathy). Underarms (axillary lymphadenopathy). Groin (inguinal lymphadenopathy). When your immune system responds to germs, infection-fighting cells and fluid build up in your lymph glands. This causes some swelling and enlargement. If the lymph nodes do not go back to normal size after you have an infection or disease, your health care provider may do tests. These tests help to monitor your condition and find the reason why the glands are still swollen andenlarged. Follow these instructions at home:  Get plenty of rest. Your health care provider may recommend over-the-counter medicines for pain. Take over-the-counter and prescription medicines only as told by your health care provider. If directed, apply heat to swollen lymph glands as often as told by your health care provider. Use the heat source that your health care provider recommends, such as a moist heat pack or a heating pad. Place a towel between your skin and the heat source. Leave the heat on for 20-30 minutes. Remove the heat if your skin turns bright red. This is especially important if you are unable to feel pain, heat, or cold. You may have a greater risk of getting burned. Check your affected lymph glands every day for changes. Check other lymph gland areas as told by your health care provider.  Check for changes such as: More swelling. Sudden increase in size. Redness or pain. Hardness. Keep all follow-up visits. This is important. Contact a health care provider if you have: Lymph glands that: Are still swollen after 2 weeks. Have suddenly gotten bigger or the swelling spreads. Are red, painful, or hard. Fluid leaking from the skin near an enlarged lymph gland. Problems with breathing. A fever, chills, or night sweats. Fatigue. A sore throat. Pain in your abdomen. Weight loss. Get help right away if you have: Severe pain. Chest pain. Shortness of breath. These symptoms may represent a serious problem that is an emergency. Do not wait to see if the symptoms will go away. Get medical help right away. Call your local emergency services (911 in the [place] Do not drive yourself to the hospital. Summary Lymphadenopathy means that your lymph glands are swollen or larger than normal. Lymph glands, also called lymph nodes, are collections of tissue that filter excess fluid, bacteria, viruses, and waste from the bloodstream. They are part of your body's disease-fighting system (immune system). Lymphadenopathy can occur anywhere that you have lymph glands. If the lymph nodes do not go back to normal size after you have an infection or disease, your health care provider may do tests to monitor your condition and find the reason why the glands are still swollen and enlarged. Check your affected lymph glands every day for changes. Check other lymph gland areas as told by your health care provider. This information is not intended to replace advice given to you by your health care provider. Make sure you discuss any questions you have with your healthcare provider. Document Revised: 06/04/2020 Document Reviewed: 06/04/2020 Elsevier Patient Education  2022 Elsevier  Inc.  

## 2021-03-12 ENCOUNTER — Other Ambulatory Visit: Payer: Self-pay | Admitting: Family Medicine

## 2021-03-12 DIAGNOSIS — R599 Enlarged lymph nodes, unspecified: Secondary | ICD-10-CM

## 2021-03-17 ENCOUNTER — Ambulatory Visit
Admission: RE | Admit: 2021-03-17 | Discharge: 2021-03-17 | Disposition: A | Discharge status: Home / Self Care | Payer: Medicare Other | Source: Ambulatory Visit | Attending: Family Medicine | Admitting: Family Medicine

## 2021-03-17 ENCOUNTER — Other Ambulatory Visit: Payer: Self-pay

## 2021-03-17 DIAGNOSIS — R590 Localized enlarged lymph nodes: Secondary | ICD-10-CM

## 2021-03-17 DIAGNOSIS — R599 Enlarged lymph nodes, unspecified: Secondary | ICD-10-CM

## 2021-03-27 ENCOUNTER — Other Ambulatory Visit: Payer: Self-pay

## 2021-03-27 ENCOUNTER — Encounter: Payer: Self-pay | Admitting: Family Medicine

## 2021-03-27 ENCOUNTER — Ambulatory Visit (INDEPENDENT_AMBULATORY_CARE_PROVIDER_SITE_OTHER): Payer: Medicare Other | Admitting: Family Medicine

## 2021-03-27 VITALS — BP 112/68 | HR 65 | Temp 98.5°F | Ht 63.0 in | Wt 148.0 lb

## 2021-03-27 DIAGNOSIS — M6281 Muscle weakness (generalized): Secondary | ICD-10-CM | POA: Insufficient documentation

## 2021-03-27 DIAGNOSIS — M5136 Other intervertebral disc degeneration, lumbar region: Secondary | ICD-10-CM

## 2021-03-27 DIAGNOSIS — M545 Low back pain, unspecified: Secondary | ICD-10-CM | POA: Insufficient documentation

## 2021-03-27 DIAGNOSIS — M4726 Other spondylosis with radiculopathy, lumbar region: Secondary | ICD-10-CM | POA: Insufficient documentation

## 2021-03-27 DIAGNOSIS — M5416 Radiculopathy, lumbar region: Secondary | ICD-10-CM

## 2021-03-27 NOTE — Progress Notes (Signed)
This visit occurred during the SARS-CoV-2 public health emergency.  Safety protocols were in place, including screening questions prior to the visit, additional usage of staff PPE, and extensive cleaning of exam room while observing appropriate contact time as indicated for disinfecting solutions.    Tiffany Bender , 04/03/1951, 70 y.o., female MRN: [id number] Patient Care Team    Relationship Specialty Notifications Start End  Ma Hillock, DO PCP - General Family Medicine  02/17/21     Chief Complaint  Patient presents with   Back Pain    Pt c/o chronic LBP that radiates down to ankle worsen within last year; pt has sees ortho for shoulder but not back     Subjective: Pt presents for an OV with complaints of back pain that has been worsening over the past year.  She states she had an x-ray over the summer at her prior PCP which showed degenerative changes in her lower spine.  She states that she has worsening pain since that time and now has noticed pain radiating down her right leg into her ankle.  She states when stepping up or down on stairs she has pain.   Had a bad fall 2016 and slipped, had mild injury at that time.  Lumbar xray 01/26/2021: IMPRESSION: Degenerative disc changes from L3 through S1.  She currently is not taking anything for the pain.  Depression screen [facility] 2/9 02/17/2021 05/31/2013  Decreased Interest 0 2  Down, Depressed, Hopeless 0 1  PHQ - 2 Score 0 3  Altered sleeping - 1  Tired, decreased energy - 3  Change in appetite - 3  Feeling bad or failure about yourself  - 0  Trouble concentrating - 1  Moving slowly or fidgety/restless - 0  Suicidal thoughts - 0  PHQ-9 Score - 11    Allergies  Allergen Reactions   Ciprofloxacin Other (See Comments) and Swelling    Pelvic swelling Other reaction(s): Other Swelling genitalia, high fever. Went to ED     Fenofibrate Other (See Comments)   Morphine Rash   Penicillins Other (See Comments)    Head  ache. rash Headaches.     Statins Other (See Comments)    Dark brown urine.  Dark unrine Other reaction(s): Other Dark brown urine.  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 She was adopted at age 86. She does drink coffee daily. She has 2 biological children and 2 adopted children.   Past Medical History:  Diagnosis Date   DDD (degenerative disc disease), lumbar    Dyspareunia in female 08/21/2015   KNEE PAIN, RIGHT 07/20/2006   Qualifier: Diagnosis of  By: Madilyn Fireman MD, Catherine     LIVER FUNCTION TESTS, ABNORMAL 09/28/2007   Qualifier: Diagnosis of  By: Wynetta Emery LPN, Kim     Neck pain    Osteopenia    Other and unspecified ovarian cysts    history   Past Surgical History:  Procedure Laterality Date   ARTHROSCOPIC REPAIR ACL     RT knee   BUNIONECTOMY  08/23/1992  bilateral   CARPAL TUNNEL RELEASE Right    CARPAL TUNNEL RELEASE  08/23/1998   Left    OVARIAN CYST REMOVAL  08/23/1986   left    TONSILLECTOMY     right   TUBAL LIGATION     VEIN LIGATION AND STRIPPING  08/24/1979   Family History  Adopted: Yes  Problem Relation Age of Onset   Alcohol abuse Father    Allergies as of 03/27/2021       Reactions   Ciprofloxacin Other (See Comments), Swelling   Pelvic swelling Other reaction(s): Other Swelling genitalia, high fever. Went to ED    Fenofibrate Other (See Comments)   Morphine Rash   Penicillins Other (See Comments)   Head ache. rash Headaches.    Statins Other (See Comments)   Dark brown urine.  Dark unrine Other reaction(s): Other Dark brown urine.  Urine got really dark Has tried Crestor and  Lipitor    Sulfamethoxazole-trimethoprim Other (See Comments)   Passed out, Eyes Swollen   Tizanidine Hcl Other (See Comments)   Dizzy, weak, throat swelling    Amoxicillin    headache   Dexamethasone Nausea Only   dizzy        Medication List        Accurate as of March 27, 2021  1:02 PM. If you have any questions, ask your nurse or doctor.          AMBULATORY NON FORMULARY MEDICATION Medication Name: waist high/panty hose medical grade compression stocking. Pressure 20-'30mg'$ Hg pressure. Dx varicose veins, leg pain and fatigue/.   ascorbic acid 500 MG tablet Commonly known as: VITAMIN C Take by mouth.   aspirin EC 81 MG tablet Take 81 mg by mouth daily. Swallow whole.   Biotin 5000 MCG Tabs Take by mouth.   BORON PO Take 3 mg by mouth.   Cholecalciferol 125 MCG (5000 UT) Tabs Take 1 tablet by mouth daily.   Cranberry 405 MG Caps 870 mg.   Probiotic Tbec Take 250 mg by mouth See admin instructions. florastor   vitamin E 180 MG (400 UNITS) capsule Take 400 Units by mouth daily.        All past medical history, surgical history, allergies, family history, immunizations andmedications were updated in the EMR today and reviewed under the history and medication portions of their EMR.     ROS: Negative, with the exception of above mentioned in HPI   Objective:  BP 112/68   Pulse 65   Temp 98.5 F (36.9 C) (Oral)   Ht '5\' 3"'$  (1.6 m)   Wt 148 lb (67.1 kg)   SpO2 96%   BMI 26.22 kg/m  Body mass index is 26.22 kg/m. Gen: Afebrile. No acute distress. Nontoxic in appearance, well developed, well nourished.  HENT: AT. [place].  MSK: Lumbar spine without erythema.  No soft tissue swelling.  No lumbar paraspinal muscle fullness.  No tenderness to palpation over bilateral SI joints.  Exquisitely tender to palpation over bony prominence L5.  Negative straight leg raises bilaterally.  Positive FABRE right for posterior pain.  Skin: No rashes, purpura or petechiae.   Neuro:  Normal gait. PERLA. EOMi. Alert. Oriented x3 Cranial nerves II through XII intact. Muscle strength 5/5 lateral lower extremity with the exception of 4/5 strength at right plantar flexion.  Neurovascular intact distally.   Psych: Normal affect, dress and demeanor. Normal speech. Normal thought content and judgment.  No results found. No results found. No results found for this or  any previous visit (from the past 24 hour(s)).  Assessment/Plan: Haylynn Kehn is a 70 y.o. female present for OV for  Lumbar radiculopathy/decreased muscle strength/disc degeneration lumbar spine Pain has now been present and worsening greater than 8 weeks.  She is now having feelings of instability and weakness when weightbearing up and down steps.  She has a positive exam today for muscle weakness and plantar flexion of right foot and is rather tender over lumbar/sacral L5-S1 bony prominences.  There is concern for right-sided nerve impingement at this level. Patient preferred not to start steroids at this time.  She has hesitant to start medication secondary to many reactions to medications. She agreed to start naproxen twice daily with food for 5 days and then as needed. - Ambulatory referral to Neurosurgery - MR Lumbar Spine Wo Contrast; Future Follow-up dependent upon MRI.  Went ahead and placed referral to neurosurgery exam today and will need further evaluation.  Hopefully we are able to get MRI set up prior.  Reviewed expectations re: course of current medical issues. Discussed self-management of symptoms. Outlined signs and symptoms indicating need for more acute intervention. Patient verbalized understanding and all questions were answered. Patient received an After-Visit Summary.    Orders Placed This Encounter  Procedures   MR Lumbar Spine Wo Contrast   Ambulatory referral to Neurosurgery   No orders of the defined types were placed in this encounter.  Referral Orders  Ambulatory  referral to Neurosurgery     Note is dictated utilizing voice recognition software. Although note has been proof read prior to signing, occasional typographical errors still can be missed. If any questions arise, please do not hesitate to call for verification.   electronically signed by:  Howard Pouch, DO  [place]

## 2021-03-27 NOTE — Patient Instructions (Signed)
Naproxen (aleve) 2 tabs with food every 12 hours for 5 days.  I will get you referred to spine specialist.    Radicular Pain Radicular pain is a type of pain that spreads from your back or neck along a spinal nerve. Spinal nerves are nerves that leave the spinal cord and go to the muscles. Radicular pain is sometimes called radiculopathy, radiculitis, or a pinched nerve. When you have this type of pain, you may also have weakness, numbness, or tingling in the area of your body that is supplied by the nerve. The pain may feel sharp and burning. Depending on which spinal nerve is affected, the pain may occur in the: Neck area (cervical radicular pain). You may also feel pain, numbness, weakness, or tingling in the arms. Mid-spine area (thoracic radicular pain). You would feel this pain in the back and chest. This type is rare. Lower back area (lumbar radicular pain). You would feel this pain as low back pain. You may feel pain, numbness, weakness, or tingling in the buttocks or legs. Sciatica is a type of lumbar radicular pain that shoots down the back of the leg. Radicular pain occurs when one of the spinal nerves becomes irritated or squeezed (compressed). It is often caused by something pushing on a spinal nerve, such as one of the bones of the spine (vertebrae) or one of the round cushions between vertebrae (intervertebral disks). This can result from: An injury. Wear and tear or aging of a disk. The growth of a bone spur that pushes on the nerve. Radicular pain often goes away when you follow instructions from your healthcare provider for relieving pain at home. Follow these instructions at home: Managing pain     If directed, put ice on the affected area: Put ice in a plastic bag. Place a towel between your skin and the bag. Leave the ice on for 20 minutes, 2-3 times a day. If directed, apply heat to the affected area as often as told by your health care provider. Use the heat source that  your health care provider recommends, such as a moist heat pack or a heating pad. Place a towel between your skin and the heat source. Leave the heat on for 20-30 minutes. Remove the heat if your skin turns bright red. This is especially important if you are unable to feel pain, heat, or cold. You may have a greater risk of getting burned. Activity  Do not sit or rest in bed for long periods of time. Try to stay as active as possible. Ask your health care provider what type of exercise or activity is best for you. Avoid activities that make your pain worse, such as bending and lifting. Do not lift anything that is heavier than 10 lb (4.5 kg), or the limit that you are told, until your health care provider says that it is safe. Practice using proper technique when lifting items. Proper lifting technique involves bending your knees and rising up. Do strength and range-of-motion exercises only as told by your health care provider or physical therapist.  General instructions Take over-the-counter and prescription medicines only as told by your health care provider. Pay attention to any changes in your symptoms. Keep all follow-up visits as told by your health care provider. This is important. Your health care provider may send you to a physical therapist to help with this pain. Contact a health care provider if: Your pain and other symptoms get worse. Your pain medicine is not helping. Your  pain has not improved after a few weeks of home care. You have a fever. Get help right away if: You have severe pain, weakness, or numbness. You have difficulty with bladder or bowel control. Summary Radicular pain is a type of pain that spreads from your back or neck along a spinal nerve. When you have radicular pain, you may also have weakness, numbness, or tingling in the area of your body that is supplied by the nerve. The pain may feel sharp or burning. Radicular pain may be treated with ice, heat,  medicines, or physical therapy. This information is not intended to replace advice given to you by your health care provider. Make sure you discuss any questions you have with your healthcare provider. Document Revised: 02/21/2018 Document Reviewed: 02/21/2018 Elsevier Patient Education  [place].

## 2021-03-30 ENCOUNTER — Other Ambulatory Visit: Payer: Self-pay

## 2021-03-30 ENCOUNTER — Ambulatory Visit (INDEPENDENT_AMBULATORY_CARE_PROVIDER_SITE_OTHER): Payer: Medicare Other

## 2021-03-30 DIAGNOSIS — M5416 Radiculopathy, lumbar region: Secondary | ICD-10-CM | POA: Diagnosis not present

## 2021-03-30 DIAGNOSIS — M545 Low back pain, unspecified: Secondary | ICD-10-CM | POA: Diagnosis not present

## 2021-03-30 DIAGNOSIS — M6281 Muscle weakness (generalized): Secondary | ICD-10-CM | POA: Diagnosis not present

## 2021-03-30 DIAGNOSIS — M5136 Other intervertebral disc degeneration, lumbar region: Secondary | ICD-10-CM

## 2021-03-31 ENCOUNTER — Telehealth: Payer: Self-pay | Admitting: Family Medicine

## 2021-03-31 NOTE — Telephone Encounter (Signed)
Spoke with pt regarding labs and instructions.   

## 2021-03-31 NOTE — Telephone Encounter (Signed)
Please inform patient the following information: MRI of her back showed significant degenerative changes throughout her lumbar spine.  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  I believe her symptoms are coming from the fourth and fifth lumbar vertebrae which has a mild forward slip, the disc is bulging at this level and there is a stenosis at this level as well with right impingement.  Which is why she is having the symptoms down her right leg now.  Please fax over MRI results to [facility] [place].  The referral for this patient's establishment was faxed to them 03/27/2021.  Hopefully they are able to get her in quickly.  Thanks.

## 2021-03-31 NOTE — Telephone Encounter (Signed)
Results printed and ready for fax

## 2021-04-02 DIAGNOSIS — M48062 Spinal stenosis, lumbar region with neurogenic claudication: Secondary | ICD-10-CM | POA: Diagnosis not present

## 2021-04-02 DIAGNOSIS — M4726 Other spondylosis with radiculopathy, lumbar region: Secondary | ICD-10-CM | POA: Diagnosis not present

## 2021-04-02 DIAGNOSIS — M4316 Spondylolisthesis, lumbar region: Secondary | ICD-10-CM | POA: Diagnosis not present

## 2021-04-02 DIAGNOSIS — M5431 Sciatica, right side: Secondary | ICD-10-CM | POA: Diagnosis not present

## 2021-04-03 ENCOUNTER — Telehealth: Payer: Self-pay | Admitting: Family Medicine

## 2021-04-03 NOTE — Telephone Encounter (Signed)
LVM for pt to CB regarding referral.

## 2021-04-03 NOTE — Telephone Encounter (Signed)
[facility] is a very reparable neurosurgeons.  If they are adjusting she have surgery, then I would just she do so.

## 2021-04-03 NOTE — Telephone Encounter (Signed)
Spoke with pt regarding referral and provider recommendations.

## 2021-04-03 NOTE — Telephone Encounter (Signed)
Please advise 

## 2021-04-03 NOTE — Telephone Encounter (Signed)
Patient states she was referred to [facility] and saw Dr. Ivan Croft who recommended surgery asap. Patient wonders if Dr. Raoul Pitch is familiar with with Dr. Sherlyn Lick and if she should go ahead with surgery or seek a second opinion. Please call patient to advise.

## 2021-04-08 ENCOUNTER — Telehealth: Payer: Self-pay | Admitting: Family Medicine

## 2021-04-08 NOTE — Telephone Encounter (Signed)
Received faxed surgical clearance form from [facility]. Placed in Dr. Lucita Lora front office inbox to be signed and returned.

## 2021-04-09 NOTE — Telephone Encounter (Signed)
Spoke with pt to schedule her a surgical clearance appt with PCP. PCP did not have any avail appt in time prior to the surgery. The pt had questioned the need of having a surgical clearance. I attempted to answer all questions to my ability and informed her that the provider would be more than happy to explain in better detail. Pt mentions that she is willing to she another provider or travel to another office. I informed pt that I will have someone give her a CB and see what I can do. Pt was upset and wanted an answer on the spot but I told her that I will need for converse with my supervisor and someone will call you shortly.   Spoke with clinical supervisor, Elicia Lamp R.N., briefed and discussed best options for pt. She stated that she will be more than happy to discuss with pt if necessary.   Spoke with Dr. Anitra Lauth in office who agreed to see pt for surgical clearance. Pt was called and sched '@11'$  8/22 by FO.

## 2021-04-09 NOTE — Telephone Encounter (Signed)
Awaiting form

## 2021-04-10 DIAGNOSIS — M48062 Spinal stenosis, lumbar region with neurogenic claudication: Secondary | ICD-10-CM | POA: Diagnosis not present

## 2021-04-10 DIAGNOSIS — M4316 Spondylolisthesis, lumbar region: Secondary | ICD-10-CM | POA: Diagnosis not present

## 2021-04-10 DIAGNOSIS — Z4689 Encounter for fitting and adjustment of other specified devices: Secondary | ICD-10-CM | POA: Diagnosis not present

## 2021-04-10 DIAGNOSIS — M4726 Other spondylosis with radiculopathy, lumbar region: Secondary | ICD-10-CM | POA: Diagnosis not present

## 2021-04-10 DIAGNOSIS — M5431 Sciatica, right side: Secondary | ICD-10-CM | POA: Diagnosis not present

## 2021-04-13 ENCOUNTER — Ambulatory Visit: Payer: Medicare Other | Admitting: Family Medicine

## 2021-04-16 ENCOUNTER — Telehealth: Payer: Self-pay | Admitting: Gastroenterology

## 2021-04-16 DIAGNOSIS — Z880 Allergy status to penicillin: Secondary | ICD-10-CM | POA: Diagnosis not present

## 2021-04-16 DIAGNOSIS — Z7901 Long term (current) use of anticoagulants: Secondary | ICD-10-CM | POA: Diagnosis not present

## 2021-04-16 DIAGNOSIS — Z881 Allergy status to other antibiotic agents status: Secondary | ICD-10-CM | POA: Diagnosis not present

## 2021-04-16 DIAGNOSIS — E785 Hyperlipidemia, unspecified: Secondary | ICD-10-CM | POA: Diagnosis not present

## 2021-04-16 DIAGNOSIS — N390 Urinary tract infection, site not specified: Secondary | ICD-10-CM | POA: Diagnosis not present

## 2021-04-16 DIAGNOSIS — Z87891 Personal history of nicotine dependence: Secondary | ICD-10-CM | POA: Diagnosis not present

## 2021-04-16 DIAGNOSIS — Z888 Allergy status to other drugs, medicaments and biological substances status: Secondary | ICD-10-CM | POA: Diagnosis not present

## 2021-04-16 DIAGNOSIS — Z882 Allergy status to sulfonamides status: Secondary | ICD-10-CM | POA: Diagnosis not present

## 2021-04-16 DIAGNOSIS — R42 Dizziness and giddiness: Secondary | ICD-10-CM | POA: Diagnosis not present

## 2021-04-16 DIAGNOSIS — R55 Syncope and collapse: Secondary | ICD-10-CM | POA: Diagnosis not present

## 2021-04-16 DIAGNOSIS — Z9851 Tubal ligation status: Secondary | ICD-10-CM | POA: Diagnosis not present

## 2021-04-16 DIAGNOSIS — Z86718 Personal history of other venous thrombosis and embolism: Secondary | ICD-10-CM | POA: Diagnosis not present

## 2021-04-16 NOTE — Telephone Encounter (Signed)
Patient called states she was feeling light headed earlier and her husband took her to [facility] and they are wanting to give her antibiotics she is calling because she said Dr. Lyndel Safe advised her not to take any antibiotics due to her C-Diff condition please advise.

## 2021-04-16 NOTE — Telephone Encounter (Signed)
Spoke with patient. Patient states she is in hospital in [place] about to discharged today. Patient states she has UTI and was treated with Rocephin and Septrum. Patient reports she was told by Dr Lyndel Safe not to take any antibiotics in the effect C Diff may reoccur. Patient also report blood pressure has been elevated but is getting better.Patient requesting recommendations on how to proceed to care for UTI and prevent another C Diff bout . Please advise, thank you

## 2021-04-17 NOTE — Telephone Encounter (Signed)
  Needs to avoid antibiotics due to history of C. Difficile as much as possible But if she does have UTI (documented with positive cultures), she would definitely need antibiotics. Recommended use it for minimum time. She should make her primary care  phy aware of previous history of C. difficile.  RG

## 2021-04-17 NOTE — Telephone Encounter (Signed)
Spoke with pt and she is aware of Dr. Steve Rattler recommendations and was placed on macrobid for 7 days. She knows to call back if she has any issuies.

## 2021-04-20 DIAGNOSIS — M4316 Spondylolisthesis, lumbar region: Secondary | ICD-10-CM | POA: Diagnosis not present

## 2021-04-20 DIAGNOSIS — M48062 Spinal stenosis, lumbar region with neurogenic claudication: Secondary | ICD-10-CM | POA: Diagnosis not present

## 2021-04-20 DIAGNOSIS — M4326 Fusion of spine, lumbar region: Secondary | ICD-10-CM | POA: Diagnosis not present

## 2021-04-20 DIAGNOSIS — M810 Age-related osteoporosis without current pathological fracture: Secondary | ICD-10-CM | POA: Diagnosis not present

## 2021-04-20 DIAGNOSIS — Z9889 Other specified postprocedural states: Secondary | ICD-10-CM | POA: Diagnosis not present

## 2021-04-20 DIAGNOSIS — M5432 Sciatica, left side: Secondary | ICD-10-CM | POA: Diagnosis not present

## 2021-04-20 DIAGNOSIS — Z981 Arthrodesis status: Secondary | ICD-10-CM | POA: Diagnosis not present

## 2021-04-20 DIAGNOSIS — M5431 Sciatica, right side: Secondary | ICD-10-CM | POA: Diagnosis not present

## 2021-04-20 DIAGNOSIS — G8918 Other acute postprocedural pain: Secondary | ICD-10-CM | POA: Diagnosis not present

## 2021-04-20 DIAGNOSIS — M4726 Other spondylosis with radiculopathy, lumbar region: Secondary | ICD-10-CM | POA: Diagnosis not present

## 2021-04-20 HISTORY — PX: OTHER SURGICAL HISTORY: SHX169

## 2021-04-21 DIAGNOSIS — M4726 Other spondylosis with radiculopathy, lumbar region: Secondary | ICD-10-CM | POA: Diagnosis not present

## 2021-04-21 DIAGNOSIS — M48062 Spinal stenosis, lumbar region with neurogenic claudication: Secondary | ICD-10-CM | POA: Diagnosis not present

## 2021-04-21 DIAGNOSIS — M5432 Sciatica, left side: Secondary | ICD-10-CM | POA: Diagnosis not present

## 2021-04-21 DIAGNOSIS — M4316 Spondylolisthesis, lumbar region: Secondary | ICD-10-CM | POA: Diagnosis not present

## 2021-04-21 DIAGNOSIS — M5431 Sciatica, right side: Secondary | ICD-10-CM | POA: Diagnosis not present

## 2021-04-21 DIAGNOSIS — M810 Age-related osteoporosis without current pathological fracture: Secondary | ICD-10-CM | POA: Diagnosis not present

## 2021-04-22 DIAGNOSIS — M810 Age-related osteoporosis without current pathological fracture: Secondary | ICD-10-CM | POA: Diagnosis not present

## 2021-04-22 DIAGNOSIS — M48062 Spinal stenosis, lumbar region with neurogenic claudication: Secondary | ICD-10-CM | POA: Diagnosis not present

## 2021-04-22 DIAGNOSIS — Z981 Arthrodesis status: Secondary | ICD-10-CM | POA: Diagnosis not present

## 2021-04-22 DIAGNOSIS — M4316 Spondylolisthesis, lumbar region: Secondary | ICD-10-CM | POA: Diagnosis not present

## 2021-04-22 DIAGNOSIS — M4726 Other spondylosis with radiculopathy, lumbar region: Secondary | ICD-10-CM | POA: Diagnosis not present

## 2021-04-22 DIAGNOSIS — M5431 Sciatica, right side: Secondary | ICD-10-CM | POA: Diagnosis not present

## 2021-04-22 DIAGNOSIS — M4326 Fusion of spine, lumbar region: Secondary | ICD-10-CM | POA: Diagnosis not present

## 2021-04-22 DIAGNOSIS — M5432 Sciatica, left side: Secondary | ICD-10-CM | POA: Diagnosis not present

## 2021-05-14 ENCOUNTER — Telehealth: Payer: Self-pay | Admitting: Family Medicine

## 2021-05-14 NOTE — Telephone Encounter (Signed)
Left message for patient to schedule Annual Wellness Visit.  Please schedule with Nurse Health Advisor Julie Greer, RN at [facility]. Please call [phone] ask for Kathy  

## 2021-05-19 DIAGNOSIS — Z981 Arthrodesis status: Secondary | ICD-10-CM | POA: Diagnosis not present

## 2021-05-19 DIAGNOSIS — M545 Low back pain, unspecified: Secondary | ICD-10-CM | POA: Diagnosis not present

## 2021-07-21 DIAGNOSIS — M48062 Spinal stenosis, lumbar region with neurogenic claudication: Secondary | ICD-10-CM | POA: Diagnosis not present

## 2021-07-21 DIAGNOSIS — M4326 Fusion of spine, lumbar region: Secondary | ICD-10-CM | POA: Diagnosis not present

## 2021-07-21 DIAGNOSIS — M4316 Spondylolisthesis, lumbar region: Secondary | ICD-10-CM | POA: Diagnosis not present

## 2021-07-21 DIAGNOSIS — M4726 Other spondylosis with radiculopathy, lumbar region: Secondary | ICD-10-CM | POA: Diagnosis not present

## 2021-09-01 DIAGNOSIS — R300 Dysuria: Secondary | ICD-10-CM | POA: Diagnosis not present

## 2021-09-01 DIAGNOSIS — N898 Other specified noninflammatory disorders of vagina: Secondary | ICD-10-CM | POA: Diagnosis not present

## 2021-09-01 DIAGNOSIS — Z8619 Personal history of other infectious and parasitic diseases: Secondary | ICD-10-CM | POA: Diagnosis not present

## 2021-09-01 DIAGNOSIS — R030 Elevated blood-pressure reading, without diagnosis of hypertension: Secondary | ICD-10-CM | POA: Diagnosis not present

## 2021-09-02 ENCOUNTER — Other Ambulatory Visit (HOSPITAL_COMMUNITY)
Admission: RE | Admit: 2021-09-02 | Discharge: 2021-09-02 | Disposition: A | Discharge status: Home / Self Care | Payer: Medicare Other | Source: Ambulatory Visit | Attending: Family Medicine | Admitting: Family Medicine

## 2021-09-02 ENCOUNTER — Other Ambulatory Visit: Payer: Self-pay

## 2021-09-02 ENCOUNTER — Ambulatory Visit (INDEPENDENT_AMBULATORY_CARE_PROVIDER_SITE_OTHER): Payer: Medicare Other | Admitting: Family Medicine

## 2021-09-02 ENCOUNTER — Encounter: Payer: Self-pay | Admitting: Family Medicine

## 2021-09-02 VITALS — BP 124/75 | HR 71 | Temp 97.9°F | Wt 146.6 lb

## 2021-09-02 DIAGNOSIS — N898 Other specified noninflammatory disorders of vagina: Secondary | ICD-10-CM | POA: Diagnosis not present

## 2021-09-02 DIAGNOSIS — N952 Postmenopausal atrophic vaginitis: Secondary | ICD-10-CM | POA: Diagnosis not present

## 2021-09-02 HISTORY — DX: Postmenopausal atrophic vaginitis: N95.2

## 2021-09-02 MED ORDER — FLUCONAZOLE 150 MG PO TABS: 150.0000 mg | ORAL_TABLET | Freq: Once | ORAL | 0 refills | Status: AC

## 2021-09-02 NOTE — Progress Notes (Signed)
This visit occurred during the SARS-CoV-2 public health emergency.  Safety protocols were in place, including screening questions prior to the visit, additional usage of staff PPE, and extensive cleaning of exam room while observing appropriate contact time as indicated for disinfecting solutions.    Tiffany Bender , 1951/03/05, 71 y.o., female MRN: [id number] Patient Care Team    Relationship Specialty Notifications Start End  Tiffany Hillock, DO PCP - General Family Medicine  02/17/21     Chief Complaint  Patient presents with   Vaginal Itching     Subjective: Pt presents for an OV with complaints of vaginal itching of days duration.  Associated symptoms include white discharge.  Patient is postmenopausal.  Patient reports she has been under more stress as of lately.  When she was in her 1s she had more frequent yeast infections but has not had a yeast infection in a couple decades.  She states originally she did notice some mild burning with urination, but she has had no urinary symptoms since.  Burning with urination was on the vaginal area and not from urethra. She had been seen at [facility] and urine culture was collected. She has not heard back from them yet.  She reports no vaginal exam was completed, which has her concerned. Pt has tried nothing to ease their symptoms.   Depression screen [facility] 2/9 02/17/2021 05/31/2013  Decreased Interest 0 2  Down, Depressed, Hopeless 0 1  PHQ - 2 Score 0 3  Altered sleeping - 1  Tired, decreased energy - 3  Change in appetite - 3  Feeling bad or failure about yourself  - 0  Trouble concentrating - 1  Moving slowly or fidgety/restless - 0  Suicidal thoughts - 0  PHQ-9 Score - 11    Allergies  Allergen Reactions   Ciprofloxacin Other (See Comments) and Swelling    Pelvic swelling Other reaction(s): Other Swelling genitalia, high fever. Went to ED     Fenofibrate Other (See Comments)   Morphine Rash   Penicillins Other (See  Comments)    Head ache. rash Headaches.     Statins Other (See Comments)    Dark brown urine.  Dark unrine Other reaction(s): Other Dark brown urine.  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 She was adopted at age 58. She does drink coffee daily. She has 2 biological children and 2 adopted children.   Past Medical History:  Diagnosis Date   DDD (degenerative disc disease), lumbar    Dyspareunia in female 08/21/2015   KNEE PAIN, RIGHT 07/20/2006   Qualifier: Diagnosis of  By: Tiffany Fireman MD, Tiffany Bender     LIVER FUNCTION TESTS, ABNORMAL 09/28/2007   Qualifier: Diagnosis of  By: Wynetta Emery LPN, Kim     Neck pain    Osteopenia    Other and unspecified ovarian cysts    history   Past Surgical History:  Procedure Laterality Date   ARTHROSCOPIC REPAIR ACL     RT knee   BUNIONECTOMY  08/23/1992   bilateral   CARPAL TUNNEL RELEASE Right  CARPAL TUNNEL RELEASE  08/23/1998   Left    OVARIAN CYST REMOVAL  08/23/1986   left    TONSILLECTOMY     right   TUBAL LIGATION     VEIN LIGATION AND STRIPPING  08/24/1979   Family History  Adopted: Yes  Problem Relation Age of Onset   Alcohol abuse Father    Allergies as of 09/02/2021       Reactions   Ciprofloxacin Other (See Comments), Swelling   Pelvic swelling Other reaction(s): Other Swelling genitalia, high fever. Went to ED    Fenofibrate Other (See Comments)   Morphine Rash   Penicillins Other (See Comments)   Head ache. rash Headaches.    Statins Other (See Comments)   Dark brown urine.  Dark unrine Other reaction(s): Other Dark brown urine.  Urine got really dark Has  tried Crestor and Lipitor    Sulfamethoxazole-trimethoprim Other (See Comments)   Passed out, Eyes Swollen   Tizanidine Hcl Other (See Comments)   Dizzy, weak, throat swelling    Amoxicillin    headache   Dexamethasone Nausea Only   dizzy        Medication List        Accurate as of September 02, 2021  5:14 PM. If you have any questions, ask your nurse or doctor.          AMBULATORY NON FORMULARY MEDICATION Medication Name: waist high/panty hose medical grade compression stocking. Pressure 20-30mg Hg pressure. Dx varicose veins, leg pain and fatigue/.   ascorbic acid 500 MG tablet Commonly known as: VITAMIN C Take by mouth.   aspirin EC 81 MG tablet Take 81 mg by mouth daily. Swallow whole.   Biotin 5000 MCG Tabs Take by mouth.   BORON PO Take 3 mg by mouth.   Cholecalciferol 125 MCG (5000 UT) Tabs Take 1 tablet by mouth daily.   Cranberry 405 MG Caps 870 mg.   fluconazole 150 MG tablet Commonly known as: DIFLUCAN Take 1 tablet (150 mg total) by mouth once for 1 dose. Started by: Tiffany Pouch, DO   Probiotic Tbec Take 250 mg by mouth See admin instructions. florastor   vitamin E 180 MG (400 UNITS) capsule Take 400 Units by mouth daily.        All past medical history, surgical history, allergies, family history, immunizations andmedications were updated in the EMR today and reviewed under the history and medication portions of their EMR.     ROS Negative, with the exception of above mentioned in HPI   Objective:  BP 124/75    Pulse 71    Temp 97.9 F (36.6 C)    Wt 146 lb 9.6 oz (66.5 kg)    SpO2 99%    BMI 25.97 kg/m  Body mass index is 25.97 kg/m.  Physical Exam   No results found. No results found. No results found for this or any previous visit (from the past 24 hour(s)).  Assessment/Plan: Tiffany Bender is a 71 y.o. female present for OV for  Vaginal itching/vaginal atrophy Exam today did not show strong evidence for yeast  infection.  She had significant vaginal atrophy.  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  We discussed this possible cause and potential treatment plans if her her cytology does not show evidence of yeast infection.> would consider tx with either topical estrogen or 1% hydrocortisone ointment for 2 weeks. Hopefully, long term tx with estrogen cream will not be necessary.  - Urine  cytology ancillary only([place]) -Elected to go ahead and start treatment for a possible yeast infection with Diflucan x1.  Patient understands it will take a couple days for the symptoms to completely resolve after taking medication if it is from yeast. Follow-up dependent upon results and clinical outcome  Reviewed expectations re: course of current medical issues. Discussed self-management of symptoms. Outlined signs and symptoms indicating need for more acute intervention. Patient verbalized understanding and all questions were answered. Patient received an After-Visit Summary.    No orders of the defined types were placed in this encounter.  Meds ordered this encounter  Medications   fluconazole (DIFLUCAN) 150 MG tablet    Sig: Take 1 tablet (150 mg total) by mouth once for 1 dose.    Dispense:  1 tablet    Refill:  0   Referral Orders  No referral(s) requested today     Note is dictated utilizing voice recognition software. Although note has been proof read prior to signing, occasional typographical errors still can be missed. If any questions arise, please do not hesitate to call for verification.   electronically signed by:  Tiffany Pouch, DO  [place]

## 2021-09-02 NOTE — Progress Notes (Signed)
Tiffany Bender medical did urine culture to [facility]. Pt wanting to be examined for yeast. Flakey on vaginal area not discharge. Also states she has had left side pain that she wants an opinion about.

## 2021-09-02 NOTE — Patient Instructions (Addendum)
° ° °  Great to see you today.  I have refilled the medication(s) we provide.   If labs were collected, we will inform you of lab results once received either by echart message or telephone call.   - echart message- for normal results that have been seen by the patient already.   - telephone call: abnormal results or if patient has not viewed results in their echart.   Atrophic Vaginitis Atrophic vaginitis is when the lining of the vagina becomes dry and thin. This is most common in women who have stopped having their periods (are in menopause). It usually starts when a woman is 63 to 70 years old. What are the causes? This condition is caused by a drop in a female hormone (estrogen). What increases the risk? You are more likely to develop this condition if: You take certain medicines. You have had your ovaries taken out. You are being treated for cancer. You have given birth or are breastfeeding. You are more than 69 years old. You smoke. What are the signs or symptoms? Pain during sex. A feeling of pressure during sex. Bleeding during sex. Burning or itching in the vagina. Burning pain when you pee (urinate). Fluid coming from your vagina. Some people do not have symptoms. How is this treated? Using a lubricant before sex. Using a moisturizer in the vagina. Using estrogen in the vagina. In some cases, you may not need treatment. Follow these instructions at home: Medicines Take all medicines only as told by your doctor. This includes medicines for dryness. Do not use herbal medicines unless your doctor says it is okay. General instructions Talk with your doctor about treatment. Do not douche. Do not use scented: Sprays. Tampons. Soaps. If sex hurts, try using lubricants right before you have sex. Contact a doctor if: You have fluid coming from the vagina that is not like normal. You have a bad smell coming from your vagina. You have new symptoms. Your symptoms do  not get better when treated. Your symptoms get worse. Summary This condition happens when the lining of the vagina becomes dry and thin. It is most common in women who no longer have periods. Treatment may include using medicines for dryness. Call a doctor if your symptoms do not get better. This information is not intended to replace advice given to you by your health care provider. Make sure you discuss any questions you have with your health care provider. Document Revised: 02/07/2020 Document Reviewed: 02/07/2020 Elsevier Patient Education  [place].

## 2021-09-07 ENCOUNTER — Telehealth: Payer: Self-pay | Admitting: Family Medicine

## 2021-09-07 DIAGNOSIS — N952 Postmenopausal atrophic vaginitis: Secondary | ICD-10-CM

## 2021-09-07 LAB — URINE CYTOLOGY ANCILLARY ONLY: Candida Urine: NEGATIVE

## 2021-09-07 MED ORDER — HYDROCORTISONE 1 % EX OINT: 1.0000 "application " | TOPICAL_OINTMENT | Freq: Two times a day (BID) | CUTANEOUS | 1 refills | Status: DC | PRN

## 2021-09-07 NOTE — Telephone Encounter (Signed)
LM for pt to return call to discuss.  

## 2021-09-07 NOTE — Telephone Encounter (Signed)
Her urine cytology did not show evidence of yeast infection.  Please ask her if she noticed an improvement in her symptoms with the diflucan tab given?  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    If her symptoms are still present, I would like her to try the ointment I called in for her today. It is to be used up to twice a day - as needed only for itching. Usually, the symptoms resolve with in a few days, but may return off/on. If they persists longer she would likely need to consider topical estrogen cream.

## 2021-09-07 NOTE — Telephone Encounter (Signed)
Spoke with patient regarding results/recommendations.  

## 2021-09-10 ENCOUNTER — Telehealth: Payer: Self-pay | Admitting: Family Medicine

## 2021-09-10 ENCOUNTER — Other Ambulatory Visit: Payer: Self-pay

## 2021-09-10 MED ORDER — HYDROCORTISONE 1 % EX OINT: 1.0000 "application " | TOPICAL_OINTMENT | Freq: Two times a day (BID) | CUTANEOUS | 1 refills | Status: AC | PRN

## 2021-09-10 NOTE — Telephone Encounter (Signed)
Rx has been sent and cancelled at [facility]

## 2021-09-10 NOTE — Telephone Encounter (Signed)
Pt called about wanting med sent to different pharmacy.--KR  hydrocortisone hydrocortisone 1 % ointment   [id number] - [place], [place] - [address] AT [address] Phone:  [phone]  Fax:  [phone]

## 2021-09-17 DIAGNOSIS — H02834 Dermatochalasis of left upper eyelid: Secondary | ICD-10-CM | POA: Diagnosis not present

## 2021-09-17 DIAGNOSIS — H02831 Dermatochalasis of right upper eyelid: Secondary | ICD-10-CM | POA: Diagnosis not present

## 2021-09-17 DIAGNOSIS — H527 Unspecified disorder of refraction: Secondary | ICD-10-CM | POA: Diagnosis not present

## 2021-09-17 DIAGNOSIS — H25813 Combined forms of age-related cataract, bilateral: Secondary | ICD-10-CM | POA: Diagnosis not present

## 2021-09-17 DIAGNOSIS — H04123 Dry eye syndrome of bilateral lacrimal glands: Secondary | ICD-10-CM | POA: Diagnosis not present

## 2021-09-17 DIAGNOSIS — H43393 Other vitreous opacities, bilateral: Secondary | ICD-10-CM | POA: Diagnosis not present

## 2021-09-17 DIAGNOSIS — H40003 Preglaucoma, unspecified, bilateral: Secondary | ICD-10-CM | POA: Diagnosis not present

## 2021-09-17 DIAGNOSIS — H02403 Unspecified ptosis of bilateral eyelids: Secondary | ICD-10-CM | POA: Diagnosis not present

## 2021-09-17 DIAGNOSIS — H11412 Vascular abnormalities of conjunctiva, left eye: Secondary | ICD-10-CM | POA: Diagnosis not present

## 2021-09-25 ENCOUNTER — Ambulatory Visit: Payer: Medicare Other | Admitting: Family Medicine

## 2021-09-28 ENCOUNTER — Encounter: Payer: Self-pay | Admitting: Family Medicine

## 2021-09-28 ENCOUNTER — Other Ambulatory Visit: Payer: Self-pay

## 2021-09-28 ENCOUNTER — Ambulatory Visit (INDEPENDENT_AMBULATORY_CARE_PROVIDER_SITE_OTHER): Payer: Medicare Other | Admitting: Family Medicine

## 2021-09-28 VITALS — BP 125/78 | HR 82 | Temp 98.3°F | Resp 12 | Ht 64.0 in | Wt 147.6 lb

## 2021-09-28 DIAGNOSIS — N644 Mastodynia: Secondary | ICD-10-CM

## 2021-09-28 DIAGNOSIS — H53483 Generalized contraction of visual field, bilateral: Secondary | ICD-10-CM | POA: Diagnosis not present

## 2021-09-28 DIAGNOSIS — H02423 Myogenic ptosis of bilateral eyelids: Secondary | ICD-10-CM | POA: Diagnosis not present

## 2021-09-28 DIAGNOSIS — M79622 Pain in left upper arm: Secondary | ICD-10-CM

## 2021-09-28 DIAGNOSIS — H02413 Mechanical ptosis of bilateral eyelids: Secondary | ICD-10-CM | POA: Diagnosis not present

## 2021-09-28 DIAGNOSIS — D487 Neoplasm of uncertain behavior of other specified sites: Secondary | ICD-10-CM | POA: Diagnosis not present

## 2021-09-28 DIAGNOSIS — H57813 Brow ptosis, bilateral: Secondary | ICD-10-CM | POA: Diagnosis not present

## 2021-09-28 DIAGNOSIS — D2230 Melanocytic nevi of unspecified part of face: Secondary | ICD-10-CM | POA: Diagnosis not present

## 2021-09-28 NOTE — Progress Notes (Signed)
This visit occurred during the SARS-CoV-2 public health emergency.  Safety protocols were in place, including screening questions prior to the visit, additional usage of staff PPE, and extensive cleaning of exam room while observing appropriate contact time as indicated for disinfecting solutions.    Tiffany Bender , 1950-11-23, 71 y.o., female MRN: [id number] Patient Care Team    Relationship Specialty Notifications Start End  Ma Hillock, DO PCP - General Family Medicine  02/17/21     Chief Complaint  Patient presents with   LEFT BREAST PAIN    Next mammogram due in 3 months   left shoulder pain     Subjective: Pt presents for an OV with complaints of left lateral breast discomfort and axillary discomfort.  She had a similar presentation last summer, but she states this actually has been aching, where prior she was told she had an incidental finding on MRI of lymph nodes. She reports the discomfort is constant and has been present at least 2-3 months.  She reports that is right along the lateral aspect of her bra line and her lower axillary area.  She does admit she has changed bra all types, but that predated the onset of her symptoms.  She has not felt any masses or visualized any redness or changes in her skin.  She denies any injury or change in activity that could have created discomfort in this area.  She states she just does not want to ignore it, if it something she should be paying attention to and would like to have imaging study to evaluate.  She had a normal mammogram May 9 at [facility].  She also had a ultrasound of her breast and left axilla on March 17, 2021 at the breast center in [place].  This resulted with normal appearing lymph nodes in the left axilla and recommendations were routine bilateral mammograms yearly.  Patient is moved and would like to have all of her breast studies including routine mammograms completed at the breast center in  [place] now.  Depression screen [facility] 2/9 02/17/2021 05/31/2013  Decreased Interest 0 2  Down, Depressed, Hopeless 0 1  PHQ - 2 Score 0 3  Altered sleeping - 1  Tired, decreased energy - 3  Change in appetite - 3  Feeling bad or failure about yourself  - 0  Trouble concentrating - 1  Moving slowly or fidgety/restless - 0  Suicidal thoughts - 0  PHQ-9 Score - 11    Allergies  Allergen Reactions   Ciprofloxacin Other (See Comments) and Swelling    Pelvic swelling Other reaction(s): Other Swelling genitalia, high fever. Went to ED     Fenofibrate Other (See Comments)   Morphine Rash   Penicillins Other (See Comments)    Head ache. rash Headaches.     Statins Other (See Comments)    Dark brown urine.  Dark unrine Other reaction(s): Other Dark brown urine.  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 She was adopted at age 5. She does drink coffee daily. She has 2  biological children and 2 adopted children.   Past Medical History:  Diagnosis Date   DDD (degenerative disc disease), lumbar    Dyspareunia in female 08/21/2015   KNEE PAIN, RIGHT 07/20/2006   Qualifier: Diagnosis of  By: Madilyn Fireman MD, Catherine     LIVER FUNCTION TESTS, ABNORMAL 09/28/2007   Qualifier: Diagnosis of  By: Wynetta Emery LPN, Kim     Neck pain    Osteopenia    Other and unspecified ovarian cysts    history   Past Surgical History:  Procedure Laterality Date   ARTHROSCOPIC REPAIR ACL     RT knee   BUNIONECTOMY  08/23/1992   bilateral   CARPAL TUNNEL RELEASE Right    CARPAL TUNNEL RELEASE  08/23/1998   Left    OVARIAN CYST REMOVAL  08/23/1986    left    POSTERIOR LUMBAR LAMINECTOMY L4-S1 AND FUSION WITH INSTRUMENTATION, L4-L5 TRANSFORAMINAL LUMBAR INTERBODY FUSION AND BONE   04/20/2021   TONSILLECTOMY     right   TUBAL LIGATION     VEIN LIGATION AND STRIPPING  08/24/1979   Family History  Adopted: Yes  Problem Relation Age of Onset   Alcohol abuse Father    Allergies as of 09/28/2021       Reactions   Ciprofloxacin Other (See Comments), Swelling   Pelvic swelling Other reaction(s): Other Swelling genitalia, high fever. Went to ED    Fenofibrate Other (See Comments)   Morphine Rash   Penicillins Other (See Comments)   Head ache. rash Headaches.    Statins Other (See Comments)   Dark brown urine.  Dark unrine Other reaction(s): Other Dark brown urine.  Urine got really dark Has tried Crestor and Lipitor    Sulfamethoxazole-trimethoprim Other (See Comments)   Passed out, Eyes Swollen   Tizanidine Hcl Other (See Comments)   Dizzy, weak, throat swelling    Amoxicillin    headache   Dexamethasone Nausea Only   dizzy        Medication List        Accurate as of September 28, 2021 11:59 PM. If you have any questions, ask your nurse or doctor.          STOP taking these medications    AMBULATORY NON FORMULARY MEDICATION Stopped by: Howard Pouch, DO   Biotin 5000 MCG Tabs Stopped by: Howard Pouch, DO       TAKE these medications    ascorbic acid 500 MG tablet Commonly known as: VITAMIN C Take by mouth.   aspirin EC 81 MG tablet Take 81 mg by mouth daily. Swallow whole.   BORON PO Take 6 mg by mouth.   Cholecalciferol 125 MCG (5000 UT) Tabs Take 1 tablet by mouth daily.   Cranberry 405 MG Caps 870 mg.   hydrocortisone 1 % ointment Apply 1 application topically 2 (two) times daily as needed for itching.   Magnesium 100 MG Caps Take 1 capsule by mouth at bedtime.   Probiotic Tbec Take 250 mg by mouth See admin instructions. florastor   vitamin E 180 MG (400 UNITS) capsule Take 400  Units by mouth daily.        All past medical history, surgical history, allergies, family history, immunizations andmedications were updated in the EMR today and reviewed under the history and medication portions of their EMR.     ROS Negative, with the exception of above mentioned in HPI   Objective:  BP 125/78 (BP Location: Right Arm, Cuff Size: Normal)  Pulse 82    Temp 98.3 F (36.8 C) (Oral)    Resp 12    Ht 5\' 4"  (1.626 m)    Wt 147 lb 9.6 oz (67 kg)    SpO2 96%    BMI 25.34 kg/m  Body mass index is 25.34 kg/m. Physical Exam Exam conducted with a chaperone present.  Chest:     Chest wall: No mass, lacerations, deformity, swelling or tenderness.  Breasts:    Right: Normal.     Left: Swelling and tenderness present. No bleeding, inverted nipple, mass, nipple discharge or skin change.    Lymphadenopathy:     Upper Body:     Left upper body: No supraclavicular, axillary or pectoral adenopathy.     No results found. No results found. No results found for this or any previous visit (from the past 24 hour(s)).  Assessment/Plan: Emiyah Spraggins is a 71 y.o. female present for OV for  Breast pain/Axillary pain, left Patient has had greater than 8-12 weeks of left lateral breast pain and axillary pain.  Uncertain etiology cannot rule out malignant process. Discussed fibrocystic breast disease with her today.  Although, her current discomfort has been constant without a waxing waning process. Agree imaging study with diagnostic left breast mammogram and ultrasound is warranted.  Orders placed today.   - US BREAST COMPLETE UNI LEFT INC AXILLA; Future - MM Digital Diagnostic Unilat L; Future    Reviewed expectations re: course of current medical issues. Discussed self-management of symptoms. Outlined signs and symptoms indicating need for more acute intervention. Patient verbalized understanding and all questions were answered. Patient received an After-Visit  Summary.    Orders Placed This Encounter  Procedures   US BREAST COMPLETE UNI LEFT INC AXILLA   MM Digital Diagnostic Unilat L   No orders of the defined types were placed in this encounter.  Referral Orders  No referral(s) requested today     Note is dictated utilizing voice recognition software. Although note has been proof read prior to signing, occasional typographical errors still can be missed. If any questions arise, please do not hesitate to call for verification.   electronically signed by:  Howard Pouch, DO  [place]

## 2021-09-29 ENCOUNTER — Encounter: Payer: Self-pay | Admitting: Family Medicine

## 2021-09-29 DIAGNOSIS — M79622 Pain in left upper arm: Secondary | ICD-10-CM | POA: Insufficient documentation

## 2021-09-29 DIAGNOSIS — N644 Mastodynia: Secondary | ICD-10-CM | POA: Insufficient documentation

## 2021-10-01 ENCOUNTER — Telehealth: Payer: Self-pay | Admitting: Family Medicine

## 2021-10-01 NOTE — Telephone Encounter (Signed)
Pt calling about referral for her breast. Wanting an update. Informed her that referral was put in on 09/29/2021.  I told pt someone will contact her about scheduling an appointment. Pt wanted to know how long do referrals take? I didn't have an answer for that.  Pt cell: [phone]

## 2021-10-05 DIAGNOSIS — H53483 Generalized contraction of visual field, bilateral: Secondary | ICD-10-CM | POA: Diagnosis not present

## 2021-10-06 ENCOUNTER — Telehealth: Payer: Self-pay

## 2021-10-06 ENCOUNTER — Ambulatory Visit: Payer: Medicare Other | Admitting: Family Medicine

## 2021-10-06 NOTE — Telephone Encounter (Signed)
Request for surgical clearance was received today from [facility].  All fields must be completed.If fields unknown, surgical office must be contacted. What type of surgery is being performed? B/l Upper Eyelid Blepharoplasty  What type of anesthesia? MAC When is this surgery scheduled? 11/30/21  Name of physician performing surgery? Dr. Isidoro Donning  Patient has been called and appt for surgical clearance has been scheduled: Yes 10/30/21  Tiffany Bender Form placed on PCP desk

## 2021-10-19 ENCOUNTER — Other Ambulatory Visit: Payer: Self-pay

## 2021-10-19 ENCOUNTER — Ambulatory Visit: Admission: RE | Admit: 2021-10-19 | Payer: Medicare Other | Source: Ambulatory Visit

## 2021-10-19 ENCOUNTER — Ambulatory Visit
Admission: RE | Admit: 2021-10-19 | Discharge: 2021-10-19 | Disposition: A | Discharge status: Home / Self Care | Payer: Medicare Other | Source: Ambulatory Visit | Attending: Family Medicine | Admitting: Family Medicine

## 2021-10-19 DIAGNOSIS — N644 Mastodynia: Secondary | ICD-10-CM | POA: Diagnosis not present

## 2021-10-19 DIAGNOSIS — R922 Inconclusive mammogram: Secondary | ICD-10-CM | POA: Diagnosis not present

## 2021-10-20 DIAGNOSIS — M4326 Fusion of spine, lumbar region: Secondary | ICD-10-CM | POA: Diagnosis not present

## 2021-10-20 DIAGNOSIS — M4316 Spondylolisthesis, lumbar region: Secondary | ICD-10-CM | POA: Diagnosis not present

## 2021-10-20 DIAGNOSIS — M546 Pain in thoracic spine: Secondary | ICD-10-CM | POA: Diagnosis not present

## 2021-10-20 DIAGNOSIS — M48062 Spinal stenosis, lumbar region with neurogenic claudication: Secondary | ICD-10-CM | POA: Diagnosis not present

## 2021-10-22 ENCOUNTER — Telehealth: Payer: Self-pay

## 2021-10-22 NOTE — Telephone Encounter (Signed)
FYI results in chart ?

## 2021-10-22 NOTE — Telephone Encounter (Signed)
Patient called in regards to her diagnostic mammo she had this week 10/19/21 at [facility].  Stated it was one of the worst experiences she has ever had in her life. ?Dr.Kuneff referred her regarding pain in breast and swollen lymph nodes. She said the mammogram tech showed no empathy or concern that she was hurting her during the examination.  I asked Deane Melick if she had contacted [facility] and spoke to a manager about her experience while visiting at their office.  She said "yes"  She is waiting for a call back from a supervisor.   ?Patient is scheduled to see Dr. Raoul Pitch on 3/10 for surgical clearance on her eye lids. ? ?Patient is wanting to know what to do for the pain and swelling.  I told patient Dr. Raoul Pitch  has already left for today. ?I would have her CMA call her tomorrow.  She agreed and understood. ?She can be reached at her home number. ? ? ? ? ? ?

## 2021-10-23 NOTE — Telephone Encounter (Signed)
Spoke with pt with provider's instructions.   ?

## 2021-10-23 NOTE — Telephone Encounter (Signed)
Over-the-counter anti-inflammatories would help with discomfort.  Example: Aleve, Advil, Motrin etc. ?

## 2021-10-23 NOTE — Telephone Encounter (Signed)
LM for pt to return call to discuss.  

## 2021-10-28 ENCOUNTER — Other Ambulatory Visit: Payer: Medicare Other

## 2021-10-28 DIAGNOSIS — D487 Neoplasm of uncertain behavior of other specified sites: Secondary | ICD-10-CM | POA: Diagnosis not present

## 2021-10-28 DIAGNOSIS — H02831 Dermatochalasis of right upper eyelid: Secondary | ICD-10-CM | POA: Diagnosis not present

## 2021-10-28 DIAGNOSIS — D2230 Melanocytic nevi of unspecified part of face: Secondary | ICD-10-CM | POA: Diagnosis not present

## 2021-10-28 DIAGNOSIS — H02413 Mechanical ptosis of bilateral eyelids: Secondary | ICD-10-CM | POA: Diagnosis not present

## 2021-10-29 ENCOUNTER — Other Ambulatory Visit: Payer: Self-pay

## 2021-10-30 ENCOUNTER — Ambulatory Visit: Payer: Medicare Other | Admitting: Family Medicine

## 2021-11-03 ENCOUNTER — Ambulatory Visit (INDEPENDENT_AMBULATORY_CARE_PROVIDER_SITE_OTHER): Payer: Medicare Other | Admitting: Family Medicine

## 2021-11-03 ENCOUNTER — Other Ambulatory Visit: Payer: Self-pay

## 2021-11-03 ENCOUNTER — Encounter: Payer: Self-pay | Admitting: Family Medicine

## 2021-11-03 VITALS — BP 114/63 | HR 72 | Temp 98.5°F | Ht 64.0 in | Wt 143.0 lb

## 2021-11-03 DIAGNOSIS — Z01818 Encounter for other preprocedural examination: Secondary | ICD-10-CM

## 2021-11-03 DIAGNOSIS — D6869 Other thrombophilia: Secondary | ICD-10-CM | POA: Diagnosis not present

## 2021-11-03 NOTE — Patient Instructions (Signed)
We will complete your forms for you after we receive the results.  ? ? ? ? ?

## 2021-11-03 NOTE — Progress Notes (Signed)
This visit occurred during the SARS-CoV-2 public health emergency.  Safety protocols were in place, including screening questions prior to the visit, additional usage of staff PPE, and extensive cleaning of exam room while observing appropriate contact time as indicated for disinfecting solutions.    Tiffany Bender , Oct 11, 1950, 71 y.o., female MRN: [id number] Patient Care Team    Relationship Specialty Notifications Start End  Natalia Leatherwood, DO PCP - General Family Medicine  02/17/21     Chief Complaint  Patient presents with   Medical Clearance    Pt is not fasting    2019 Subjective: Pt presents for an OV with  Procedure: Bilateral blepharoplasty Indication: Decreased visual acuity Anesthesia: MAC   - Low = endoscopic, cataract and breast Prior anesthesia complications:N/A Family history of prior anesthesia complications:N/A  Cardiac:    - CBD, PAD, stroke, MI, aortic stenosis . N/A   - METs: >4 METS   - EKG: Not indicated Pulmonary: Patient denies Endocrine: Patient denies Obesity:Body mass index is 24.55 kg/m. Chronic kidney disease:.  Cr 0.79.> normal Chronic med that needs to be continued: none Anticoagulation: ASA  Depression screen [facility] 2/9 11/03/2021 02/17/2021 05/31/2013  Decreased Interest 0 0 2  Down, Depressed, Hopeless 1 0 1  PHQ - 2 Score 1 0 3  Altered sleeping - - 1  Tired, decreased energy - - 3  Change in appetite - - 3  Feeling bad or failure about yourself  - - 0  Trouble concentrating - - 1  Moving slowly or fidgety/restless - - 0  Suicidal thoughts - - 0  PHQ-9 Score - - 11    Allergies  Allergen Reactions   Ciprofloxacin Other (See Comments) and Swelling    Pelvic swelling Other reaction(s): Other Swelling genitalia, high fever. Went to ED     Fenofibrate Other (See Comments)   Morphine Rash   Penicillins Other (See Comments)    Head ache. rash Headaches.     Statins Other (See Comments)    Dark brown urine.  Dark  unrine Other reaction(s): Other Dark brown urine.  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 She was adopted at age 28. She does drink coffee daily. She has 2 biological children and 2 adopted children.   Past Medical History:  Diagnosis Date   DDD (degenerative disc disease), lumbar    Dyspareunia in female 08/21/2015   KNEE PAIN, RIGHT 07/20/2006   Qualifier: Diagnosis of  By: Linford Arnold MD, Catherine     LIVER FUNCTION TESTS, ABNORMAL 09/28/2007   Qualifier: Diagnosis of  By: Laural Benes LPN, Kim     Neck pain    Osteopenia    Other and unspecified ovarian cysts    history   Vaginal atrophy 09/02/2021   Past Surgical History:  Procedure Laterality Date   ARTHROSCOPIC REPAIR ACL     RT knee   BUNIONECTOMY  08/23/1992   bilateral   CARPAL TUNNEL RELEASE Right    CARPAL TUNNEL RELEASE  08/23/1998   Left  OVARIAN CYST REMOVAL  08/23/1986   left    POSTERIOR LUMBAR LAMINECTOMY L4-S1 AND FUSION WITH INSTRUMENTATION, L4-L5 TRANSFORAMINAL LUMBAR INTERBODY FUSION AND BONE   04/20/2021   TONSILLECTOMY     right   TUBAL LIGATION     VEIN LIGATION AND STRIPPING  08/24/1979   Family History  Adopted: Yes  Problem Relation Age of Onset   Alcohol abuse Father    Allergies as of 11/03/2021       Reactions   Ciprofloxacin Other (See Comments), Swelling   Pelvic swelling Other reaction(s): Other Swelling genitalia, high fever. Went to ED    Fenofibrate Other (See Comments)   Morphine Rash   Penicillins Other (See Comments)   Head ache. rash Headaches.    Statins Other (See Comments)   Dark brown urine.   Dark unrine Other reaction(s): Other Dark brown urine.  Urine got really dark Has tried Crestor and Lipitor    Sulfamethoxazole-trimethoprim Other (See Comments)   Passed out, Eyes Swollen   Tizanidine Hcl Other (See Comments)   Dizzy, weak, throat swelling    Amoxicillin    headache   Dexamethasone Nausea Only   dizzy   Latex Itching, Rash        Medication List        Accurate as of November 03, 2021 11:59 PM. If you have any questions, ask your nurse or doctor.          ascorbic acid 500 MG tablet Commonly known as: VITAMIN C Take by mouth.   aspirin EC 81 MG tablet Take 81 mg by mouth daily. Swallow whole.   BORON PO Take 6 mg by mouth.   Cholecalciferol 125 MCG (5000 UT) Tabs Take 1 tablet by mouth daily.   Cranberry 405 MG Caps 870 mg.   hydrocortisone 1 % ointment Apply 1 application topically 2 (two) times daily as needed for itching.   Magnesium 100 MG Caps Take 1 capsule by mouth at bedtime.   Probiotic Tbec Take 250 mg by mouth See admin instructions. florastor   vitamin E 180 MG (400 UNITS) capsule Take 400 Units by mouth daily.        All past medical history, surgical history, allergies, family history, immunizations andmedications were updated in the EMR today and reviewed under the history and medication portions of their EMR.     ROS Negative, with the exception of above mentioned in HPI   Objective:  BP 114/63   Pulse 72   Temp 98.5 F (36.9 C) (Oral)   Ht 5\' 4"  (1.626 m)   Wt 143 lb (64.9 kg)   SpO2 97%   BMI 24.55 kg/m  Body mass index is 24.55 kg/m. Physical Exam Vitals and nursing note reviewed.  Constitutional:      General: She is not in acute distress.    Appearance: Normal appearance. She is normal weight. She is not ill-appearing or toxic-appearing.  HENT:     Head: Normocephalic and atraumatic.  Eyes:     Extraocular Movements: Extraocular movements intact.     Conjunctiva/sclera: Conjunctivae normal.      Pupils: Pupils are equal, round, and reactive to light.  Cardiovascular:     Rate and Rhythm: Normal rate and regular rhythm.     Heart sounds: No murmur heard.   No friction rub. No gallop.  Pulmonary:     Effort: Pulmonary effort is normal. No respiratory distress.     Breath sounds: Normal breath sounds. No stridor. No wheezing,  rhonchi or rales.  Musculoskeletal:     Right lower leg: No edema.     Left lower leg: No edema.  Neurological:     Mental Status: She is alert and oriented to person, place, and time. Mental status is at baseline.  Psychiatric:        Mood and Affect: Mood normal.        Behavior: Behavior normal.        Thought Content: Thought content normal.        Judgment: Judgment normal.     No results found. No results found. No results found for this or any previous visit (from the past 24 hour(s)).  Assessment/Plan: Tiffany Bender is a 71 y.o. female present for OV for  Preoperative clearance To the best of my knowledge and per patients reported PMH, there is not a medical contraindication for undergoing elective surgery. Patient  is of low/average risk for medical/cardiac complications. Avoid NSAID prior to procedure, per surgical team instruction.  Patient understands the purpose of preoperative visit is to attempt to minimize surgical complications and communicate to surgical team chronic conditions and management. No patient is free of risk when undergoing a procedure. The decision about whether to proceed with the operation belongs to the surgeon and the patient. Patient's chronic conditions have been stable.  He is not anemic.  He is not a diabetic.  His BMI is in the obese range, but less than 40 per requirement. - CBC - Basic Metabolic Panel (BMET)  Acquired thrombophilia (HCC) CBC It was recommended that patient could discontinue her aspirin approximately 7 to 10 days prior to her surgical date.  Restart 1 to 2 days after her surgery. No  patient is free of risk when undergoing a procedure. The decision about whether to proceed with the operation belongs to the surgeon and the patient. She understands she could be at risk for blood clot without aspirin daily, however her DVT diagnosis was possibly not accurate to begin with per her report.  Reviewed expectations re: course of current medical issues. Discussed self-management of symptoms. Outlined signs and symptoms indicating need for more acute intervention. Patient verbalized understanding and all questions were answered. Patient received an After-Visit Summary.    Orders Placed This Encounter  Procedures   CBC   Basic Metabolic Panel (BMET)   No orders of the defined types were placed in this encounter.  Referral Orders  No referral(s) requested today     Note is dictated utilizing voice recognition software. Although note has been proof read prior to signing, occasional typographical errors still can be missed. If any questions arise, please do not hesitate to call for verification.   electronically signed by:  Felix Pacini, DO  [facility] [place]

## 2021-11-04 ENCOUNTER — Telehealth: Payer: Self-pay | Admitting: Family Medicine

## 2021-11-04 LAB — BASIC METABOLIC PANEL
BUN: 14 mg/dL (ref 7–25)
CO2: 28 mmol/L (ref 20–32)
Calcium: 9.9 mg/dL (ref 8.6–10.4)
Chloride: 104 mmol/L (ref 98–110)
Creat: 0.79 mg/dL (ref 0.50–1.05)
Glucose, Bld: 86 mg/dL (ref 65–99)
Potassium: 4.6 mmol/L (ref 3.5–5.3)
Sodium: 140 mmol/L (ref 135–146)

## 2021-11-04 LAB — CBC
HCT: 41.1 % (ref 35.0–45.0)
Hemoglobin: 14 g/dL (ref 11.7–15.5)
MCH: 30.2 pg (ref 27.0–33.0)
MCHC: 34.1 g/dL (ref 32.0–36.0)
MCV: 88.6 fL (ref 80.0–100.0)
MPV: 11.3 fL (ref 7.5–12.5)
Platelets: 328 10*3/uL (ref 140–400)
RBC: 4.64 10*6/uL (ref 3.80–5.10)
RDW: 12.6 % (ref 11.0–15.0)
WBC: 8.5 10*3/uL (ref 3.8–10.8)

## 2021-11-04 NOTE — Telephone Encounter (Signed)
Please inform patient her kidney function is normal.  Electrolytes normal.  Blood cell counts normal.  I will complete her form today and fax it to her surgical team. ? ?Placed in [place] work basket ?

## 2021-11-04 NOTE — Telephone Encounter (Signed)
Spoke with pt regarding labs and instructions.   

## 2021-11-05 ENCOUNTER — Ambulatory Visit (INDEPENDENT_AMBULATORY_CARE_PROVIDER_SITE_OTHER): Payer: Medicare Other

## 2021-11-05 ENCOUNTER — Other Ambulatory Visit: Payer: Self-pay

## 2021-11-05 DIAGNOSIS — M8580 Other specified disorders of bone density and structure, unspecified site: Secondary | ICD-10-CM

## 2021-11-05 DIAGNOSIS — Z1231 Encounter for screening mammogram for malignant neoplasm of breast: Secondary | ICD-10-CM

## 2021-11-05 DIAGNOSIS — Z0000 Encounter for general adult medical examination without abnormal findings: Secondary | ICD-10-CM

## 2021-11-05 DIAGNOSIS — Z780 Asymptomatic menopausal state: Secondary | ICD-10-CM

## 2021-11-05 DIAGNOSIS — Z13820 Encounter for screening for osteoporosis: Secondary | ICD-10-CM | POA: Diagnosis not present

## 2021-11-05 NOTE — Progress Notes (Signed)
? ?Subjective:  ? Tiffany Bender is a 71 y.o. female who presents for an Initial Medicare Annual Wellness Visit. ? ?Review of Systems    ?I connected with  Geradine Girt on 11/05/21 by an audio only telemedicine application and verified that I am speaking with the correct person using two identifiers. ?  ?I discussed the limitations, risks, security and privacy concerns of performing an evaluation and management service by telephone and the availability of in person appointments. I also discussed with the patient that there may be a patient responsible charge related to this service. The patient expressed understanding and verbally consented to this telephonic visit. ? ?Location of Patient: home ?Location of Provider: office ? ?List any persons and their role that are participating in the visit with the patient.  ? ?Loraine Grip  ?[facility] ? ?Cardiac Risk Factors include: none;advanced age (>38mn, >>68women) ? ?   ?Objective:  ?  ?There were no vitals filed for this visit. ?There is no height or weight on file to calculate BMI. ? ?No flowsheet data found. ? ?Current Medications (verified) ?Outpatient Encounter Medications as of 11/05/2021  ?Medication Sig  ? ascorbic acid (VITAMIN C) 500 MG tablet Take by mouth.  ? aspirin EC 81 MG tablet Take 81 mg by mouth daily. Swallow whole.  ? BORON PO Take 6 mg by mouth.  ? Cholecalciferol 125 MCG (5000 UT) TABS Take 1 tablet by mouth daily.  ? Cranberry 405 MG CAPS 870 mg.  ? hydrocortisone 1 % ointment Apply 1 application topically 2 (two) times daily as needed for itching.  ? Magnesium 100 MG CAPS Take 1 capsule by mouth at bedtime.  ? Probiotic TBEC Take 250 mg by mouth See admin instructions. florastor  ? vitamin E 180 MG (400 UNITS) capsule Take 400 Units by mouth daily.  ? ?No facility-administered encounter medications on file as of 11/05/2021.  ? ? ?Allergies (verified) ?Ciprofloxacin, Fenofibrate, Morphine, Penicillins, Statins,  Sulfamethoxazole-trimethoprim, Tizanidine hcl, Amoxicillin, Dexamethasone, and Latex  ? ?History: ?Past Medical History:  ?Diagnosis Date  ? DDD (degenerative disc disease), lumbar   ? Dyspareunia in female 08/21/2015  ? KNEE PAIN, RIGHT 07/20/2006  ? Qualifier: Diagnosis of  By: MMadilyn FiremanMD, CBarnetta Chapel   ? LIVER FUNCTION TESTS, ABNORMAL 09/28/2007  ? Qualifier: Diagnosis of  By: JWynetta EmeryLPN, Kim    ? Neck pain   ? Osteopenia   ? Other and unspecified ovarian cysts   ? history  ? Vaginal atrophy 09/02/2021  ? ?Past Surgical History:  ?Procedure Laterality Date  ? ARTHROSCOPIC REPAIR ACL    ? RT knee  ? BUNIONECTOMY  08/23/1992  ? bilateral  ? CARPAL TUNNEL RELEASE Right   ? CARPAL TUNNEL RELEASE  08/23/1998  ? Left   ? OVARIAN CYST REMOVAL  08/23/1986  ? left   ? POSTERIOR LUMBAR LAMINECTOMY L4-S1 AND FUSION WITH INSTRUMENTATION, L4-L5 TRANSFORAMINAL LUMBAR INTERBODY FUSION AND BONE   04/20/2021  ? TONSILLECTOMY    ? right  ? TUBAL LIGATION    ? VEIN LIGATION AND STRIPPING  08/24/1979  ? ?Family History  ?Adopted: Yes  ?Problem Relation Age of Onset  ? Alcohol abuse Father   ? ?Social History  ? ?Socioeconomic History  ? Marital status: Married  ?  Spouse name: RV  ? Number of children: 2  ? Years of education: Not on file  ? Highest education level: Not on file  ?Occupational History  ? Occupation: Homemaker   ?  Tobacco Use  ? Smoking status: Former  ?  Types: Cigarettes  ?  Quit date: 08/24/1983  ?  Years since quitting: 38.2  ? Smokeless tobacco: Never  ?Vaping Use  ? Vaping Use: Never used  ?Substance and Sexual Activity  ? Alcohol use: Yes  ?  Comment: socially  ? Drug use: No  ? Sexual activity: Yes  ?  Partners: Male  ?  Comment: Married; adopted, currently unemployed, college education, 2 children, caffeine 2-3 day, former huwsband abusinve  ?Other Topics Concern  ? Not on file  ?Social History Narrative  ? 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 She was adopted at age 34. She  does drink coffee daily. She has 2 biological children and 2 adopted children.  ? ?Social Determinants of Health  ? ?Financial Resource Strain: Not on file  ?Food Insecurity: Not on file  ?Transportation Needs: Not on file  ?Physical Activity: Sufficiently Active  ? Days of Exercise per Week: 7 days  ? Minutes of Exercise per Session: 60 min  ?Stress: Not on file  ?Social Connections: Moderately Integrated  ? Frequency of Communication with Friends and Family: Once a week  ? Frequency of Social Gatherings with Friends and Family: Once a week  ? Attends Religious Services: More than 4 times per year  ? Active Member of Clubs or Organizations: No  ? Attends Archivist Meetings: 1 to 4 times per year  ? Marital Status: Married  ? ? ?Tobacco Counseling ?Counseling given: Not Answered ? ? ?Clinical Intake: ? ?Pre-visit preparation completed: No ? ?Pain : No/denies pain ? ?  ? ?Nutritional Risks: None ?Diabetes: No ? ?How often do you need to have someone help you when you read instructions, pamphlets, or other written materials from your doctor or pharmacy?: 1 - Never ? ?Diabetic?no ? ?Interpreter Needed?: No ? ?  ? ? ?Activities of Daily Living ?In your present state of health, do you have any difficulty performing the following activities: 11/05/2021  ?Hearing? N  ?Vision? N  ?Difficulty concentrating or making decisions? N  ?Walking or climbing stairs? N  ?Dressing or bathing? N  ?Doing errands, shopping? N  ?Preparing Food and eating ? N  ?Using the Toilet? N  ?In the past six months, have you accidently leaked urine? N  ?Do you have problems with loss of bowel control? N  ?Managing your Medications? N  ?Managing your Finances? N  ?Housekeeping or managing your Housekeeping? N  ?Some recent data might be hidden  ? ? ?Patient Care Team: ?Ma Hillock, DO as PCP - General (Family Medicine) ? ?Indicate any recent Medical Services you may have received from other than [facility] providers in the past year (date may  be approximate). ? ?   ?Assessment:  ? This is a routine wellness examination for Tiffany Bender. ? ?Hearing/Vision screen ?No results found. ? ?Dietary issues and exercise activities discussed: ?Current Exercise Habits: The patient does not participate in regular exercise at present ? ? Goals Addressed   ?None ?  ?Depression Screen ?PHQ 2/9 Scores 11/03/2021 02/17/2021 05/31/2013  ?PHQ - 2 Score 1 0 3  ?PHQ- 9 Score - - 11  ?  ?Fall Risk ?Fall Risk  11/03/2021 02/17/2021  ?Falls in the past year? 0 0  ?Number falls in past yr: 0 0  ?Injury with Fall? 0 0  ?Risk for fall due to : No Fall Risks -  ?Follow up Falls evaluation completed  Falls evaluation completed  ? ? ?FALL RISK PREVENTION PERTAINING TO THE HOME: ? ?Any stairs in or around the home? Yes  ?If so, are there any without handrails? Yes  ?Home free of loose throw rugs in walkways, pet beds, electrical cords, etc? Yes  ?Adequate lighting in your home to reduce risk of falls? Yes  ? ?ASSISTIVE DEVICES UTILIZED TO PREVENT FALLS: ? ?Life alert? No  ?Use of a cane, walker or w/c? No  ?Grab bars in the bathroom? No  ?Shower chair or bench in shower? Yes  ?Elevated toilet seat or a handicapped toilet? No  ? ?TIMED UP AND GO: ? ?Was the test performed?  n/a .  ?Length of time to ambulate 10 feet: n/a sec.  ? ? ? ?Cognitive Function: ?  ?  ?6CIT Screen 11/05/2021  ?What Year? 0 points  ?What month? 0 points  ?What time? 0 points  ?Count back from 20 0 points  ?Months in reverse 0 points  ?Repeat phrase 0 points  ?Total Score 0  ? ? ?Immunizations ?Immunization History  ?Administered Date(s) Administered  ? Influenza Split 07/29/2011, 08/11/2012  ? Influenza Whole 06/23/2005, 06/26/2007  ? Influenza-Unspecified 06/08/2013  ? Td 06/23/2004  ? Tdap 05/19/2012  ? ? ?TDAP status: Up to date ? ?Flu Vaccine status: Declined, Education has been provided regarding the importance of this vaccine but patient still declined. Advised may receive this vaccine at local pharmacy or Health Dept.  Aware to provide a copy of the vaccination record if obtained from local pharmacy or Health Dept. Verbalized acceptance and understanding. ? ?Pneumococcal vaccine status: Declined,  Education has been provided regardi

## 2021-11-05 NOTE — Patient Instructions (Signed)

## 2021-11-11 ENCOUNTER — Telehealth: Payer: Self-pay

## 2021-11-11 NOTE — Telephone Encounter (Signed)
Per Radiology note on her last mammogram: ?"Continued clinical evaluation for left breast pain.Return to annual screening mammography 12/2021." ? ?She just had a diagnostic mam on her left last month. She does not need a diagnostic per radiology. ?

## 2021-11-11 NOTE — Telephone Encounter (Signed)
Patient called and she was trying to set up mammogram appt, however [place] told patient to call her provider and get mammogram changed to Diagnostic mammogram because pt has symptoms of swelling and she is in pain. ? ?Please update order.  Patient can be reached at [phone].  ?

## 2021-11-11 NOTE — Telephone Encounter (Signed)
Spoke with pt regarding mammogram recommendations. ?

## 2021-11-11 NOTE — Telephone Encounter (Signed)
LVM for pt to CB to sched appt.  ? ?Note:  ?Please assist with scheduling pt with sx she stated to radiology.  ?

## 2021-11-11 NOTE — Telephone Encounter (Signed)
Pt states this is a ongoing issue and need diagnostic mammogram for her yearly due to ongoing sx. ?

## 2021-11-12 ENCOUNTER — Ambulatory Visit: Payer: Medicare Other

## 2021-11-18 ENCOUNTER — Telehealth: Payer: Self-pay | Admitting: Family Medicine

## 2021-11-18 DIAGNOSIS — M8580 Other specified disorders of bone density and structure, unspecified site: Secondary | ICD-10-CM

## 2021-11-18 DIAGNOSIS — M810 Age-related osteoporosis without current pathological fracture: Secondary | ICD-10-CM

## 2021-11-18 DIAGNOSIS — Z13820 Encounter for screening for osteoporosis: Secondary | ICD-10-CM

## 2021-11-18 DIAGNOSIS — M5136 Other intervertebral disc degeneration, lumbar region: Secondary | ICD-10-CM

## 2021-11-18 DIAGNOSIS — Z0000 Encounter for general adult medical examination without abnormal findings: Secondary | ICD-10-CM

## 2021-11-18 DIAGNOSIS — M51369 Other intervertebral disc degeneration, lumbar region without mention of lumbar back pain or lower extremity pain: Secondary | ICD-10-CM

## 2021-11-18 NOTE — Telephone Encounter (Signed)
Pt was advised by [facility] that she needed to repeat bone density at [facility]. ? ?She last went to [facility] on October 24, 2019.  ? ?Please fax bone density order to [phone] ?

## 2021-11-18 NOTE — Addendum Note (Signed)
Addended by: Octaviano Glow on: 11/18/2021 05:06 PM ? ? Modules accepted: Orders ? ?

## 2021-11-18 NOTE — Telephone Encounter (Signed)
Do I need to do a new order for this pt? ?

## 2021-11-18 NOTE — Telephone Encounter (Signed)
faxed

## 2021-11-30 DIAGNOSIS — H02831 Dermatochalasis of right upper eyelid: Secondary | ICD-10-CM | POA: Diagnosis not present

## 2021-11-30 DIAGNOSIS — H02834 Dermatochalasis of left upper eyelid: Secondary | ICD-10-CM | POA: Diagnosis not present

## 2021-11-30 DIAGNOSIS — H53483 Generalized contraction of visual field, bilateral: Secondary | ICD-10-CM | POA: Diagnosis not present

## 2021-11-30 DIAGNOSIS — H53453 Other localized visual field defect, bilateral: Secondary | ICD-10-CM | POA: Diagnosis not present

## 2021-11-30 DIAGNOSIS — H57813 Brow ptosis, bilateral: Secondary | ICD-10-CM | POA: Diagnosis not present

## 2021-11-30 DIAGNOSIS — D2230 Melanocytic nevi of unspecified part of face: Secondary | ICD-10-CM | POA: Diagnosis not present

## 2021-11-30 DIAGNOSIS — H02413 Mechanical ptosis of bilateral eyelids: Secondary | ICD-10-CM | POA: Diagnosis not present

## 2021-11-30 DIAGNOSIS — D487 Neoplasm of uncertain behavior of other specified sites: Secondary | ICD-10-CM | POA: Diagnosis not present

## 2021-11-30 DIAGNOSIS — H02423 Myogenic ptosis of bilateral eyelids: Secondary | ICD-10-CM | POA: Diagnosis not present

## 2021-11-30 HISTORY — PX: EYELID LACERATION REPAIR: SHX1564

## 2022-01-01 ENCOUNTER — Ambulatory Visit
Admission: RE | Admit: 2022-01-01 | Discharge: 2022-01-01 | Disposition: A | Discharge status: Home / Self Care | Payer: Medicare Other | Source: Ambulatory Visit | Attending: Family Medicine | Admitting: Family Medicine

## 2022-01-01 DIAGNOSIS — Z1231 Encounter for screening mammogram for malignant neoplasm of breast: Secondary | ICD-10-CM | POA: Diagnosis not present

## 2022-01-04 DIAGNOSIS — M5136 Other intervertebral disc degeneration, lumbar region: Secondary | ICD-10-CM | POA: Diagnosis not present

## 2022-01-04 DIAGNOSIS — Z13820 Encounter for screening for osteoporosis: Secondary | ICD-10-CM | POA: Diagnosis not present

## 2022-01-04 DIAGNOSIS — M8580 Other specified disorders of bone density and structure, unspecified site: Secondary | ICD-10-CM | POA: Diagnosis not present

## 2022-01-04 DIAGNOSIS — M85852 Other specified disorders of bone density and structure, left thigh: Secondary | ICD-10-CM | POA: Diagnosis not present

## 2022-01-04 DIAGNOSIS — Z0000 Encounter for general adult medical examination without abnormal findings: Secondary | ICD-10-CM | POA: Diagnosis not present

## 2022-01-04 DIAGNOSIS — M810 Age-related osteoporosis without current pathological fracture: Secondary | ICD-10-CM | POA: Diagnosis not present

## 2022-01-04 LAB — HM DEXA SCAN

## 2022-01-12 ENCOUNTER — Telehealth: Payer: Self-pay

## 2022-01-12 DIAGNOSIS — M85852 Other specified disorders of bone density and structure, left thigh: Secondary | ICD-10-CM

## 2022-01-12 NOTE — Telephone Encounter (Signed)
Spoke with pt regarding labs and instructions.   

## 2022-01-12 NOTE — Telephone Encounter (Signed)
Received DEXA results from [facility] on 01/06/22. Will place on PCP desk for review.

## 2022-01-12 NOTE — Telephone Encounter (Signed)
Please inform patient we have received her bone density results. Her bone density is mildly decreased in the left thigh/hip area.  This is just mildly in the osteopenia range.  Osteopenia-basic recommendations 1.) Drink alcohol in moderation only 2.) Decrease caffeine consumption to no  More than 2.5 cups a day  3.) Exercise: weight bearing (walking counts), strength and balance training. 4.) No smoking.  5.) Sunlight/Ultraviolet light exposure 30 minutes a day/5 days a week. 6.)  Continue Vitamin D supplement  7.)  Total calcium intake by diet and supplement should be approximately 1200 mg of calcium daily.  Unless patient has ever been told she has high calcium or she develops kidney stones.

## 2022-03-18 DIAGNOSIS — H40003 Preglaucoma, unspecified, bilateral: Secondary | ICD-10-CM | POA: Diagnosis not present

## 2022-03-18 DIAGNOSIS — H02402 Unspecified ptosis of left eyelid: Secondary | ICD-10-CM | POA: Diagnosis not present

## 2022-04-06 DIAGNOSIS — H0279 Other degenerative disorders of eyelid and periocular area: Secondary | ICD-10-CM | POA: Diagnosis not present

## 2022-04-06 DIAGNOSIS — H57813 Brow ptosis, bilateral: Secondary | ICD-10-CM | POA: Diagnosis not present

## 2022-04-06 DIAGNOSIS — H02423 Myogenic ptosis of bilateral eyelids: Secondary | ICD-10-CM | POA: Diagnosis not present

## 2022-04-06 DIAGNOSIS — H02413 Mechanical ptosis of bilateral eyelids: Secondary | ICD-10-CM | POA: Diagnosis not present

## 2022-04-08 DIAGNOSIS — M50123 Cervical disc disorder at C6-C7 level with radiculopathy: Secondary | ICD-10-CM | POA: Diagnosis not present

## 2022-04-08 DIAGNOSIS — M542 Cervicalgia: Secondary | ICD-10-CM | POA: Diagnosis not present

## 2022-04-08 DIAGNOSIS — M50122 Cervical disc disorder at C5-C6 level with radiculopathy: Secondary | ICD-10-CM | POA: Diagnosis not present

## 2022-04-22 DIAGNOSIS — M4326 Fusion of spine, lumbar region: Secondary | ICD-10-CM | POA: Diagnosis not present

## 2022-04-22 DIAGNOSIS — M4722 Other spondylosis with radiculopathy, cervical region: Secondary | ICD-10-CM | POA: Diagnosis not present

## 2022-04-22 DIAGNOSIS — M4726 Other spondylosis with radiculopathy, lumbar region: Secondary | ICD-10-CM | POA: Diagnosis not present

## 2022-04-28 ENCOUNTER — Encounter: Payer: Self-pay | Admitting: Family Medicine

## 2022-04-28 ENCOUNTER — Ambulatory Visit (INDEPENDENT_AMBULATORY_CARE_PROVIDER_SITE_OTHER): Payer: Medicare Other | Admitting: Family Medicine

## 2022-04-28 ENCOUNTER — Telehealth: Payer: Self-pay

## 2022-04-28 VITALS — BP 109/65 | HR 63 | Temp 97.8°F | Ht 63.5 in | Wt 138.0 lb

## 2022-04-28 DIAGNOSIS — E559 Vitamin D deficiency, unspecified: Secondary | ICD-10-CM | POA: Diagnosis not present

## 2022-04-28 DIAGNOSIS — E782 Mixed hyperlipidemia: Secondary | ICD-10-CM | POA: Diagnosis not present

## 2022-04-28 DIAGNOSIS — Z789 Other specified health status: Secondary | ICD-10-CM

## 2022-04-28 DIAGNOSIS — I82562 Chronic embolism and thrombosis of left calf muscular vein: Secondary | ICD-10-CM

## 2022-04-28 DIAGNOSIS — Z23 Encounter for immunization: Secondary | ICD-10-CM

## 2022-04-28 DIAGNOSIS — M85852 Other specified disorders of bone density and structure, left thigh: Secondary | ICD-10-CM | POA: Diagnosis not present

## 2022-04-28 DIAGNOSIS — Z1231 Encounter for screening mammogram for malignant neoplasm of breast: Secondary | ICD-10-CM

## 2022-04-28 LAB — LIPID PANEL
Cholesterol: 269 mg/dL — ABNORMAL HIGH (ref 0–200)
HDL: 69.6 mg/dL (ref >39.00)
LDL Cholesterol: 166 mg/dL — ABNORMAL HIGH (ref 0–99)
NonHDL: 199.11
Total CHOL/HDL Ratio: 4
Triglycerides: 167 mg/dL — ABNORMAL HIGH (ref 0.0–149.0)
VLDL: 33.4 mg/dL (ref 0.0–40.0)

## 2022-04-28 LAB — CBC
HCT: 43.6 % (ref 36.0–46.0)
Hemoglobin: 14.1 g/dL (ref 12.0–15.0)
MCHC: 32.3 g/dL (ref 30.0–36.0)
MCV: 92 fl (ref 78.0–100.0)
Platelets: 284 10*3/uL (ref 150.0–400.0)
RBC: 4.74 Mil/uL (ref 3.87–5.11)
RDW: 13.3 % (ref 11.5–15.5)
WBC: 5.1 10*3/uL (ref 4.0–10.5)

## 2022-04-28 LAB — COMPREHENSIVE METABOLIC PANEL
ALT: 37 U/L — ABNORMAL HIGH (ref 0–35)
AST: 18 U/L (ref 0–37)
Albumin: 4.3 g/dL (ref 3.5–5.2)
Alkaline Phosphatase: 73 U/L (ref 39–117)
BUN: 15 mg/dL (ref 6–23)
CO2: 26 mEq/L (ref 19–32)
Calcium: 9.7 mg/dL (ref 8.4–10.5)
Chloride: 104 mEq/L (ref 96–112)
Creatinine, Ser: 0.84 mg/dL (ref 0.40–1.20)
GFR: 70.95 mL/min (ref >60.00)
Glucose, Bld: 78 mg/dL (ref 70–99)
Potassium: 4.6 mEq/L (ref 3.5–5.1)
Sodium: 140 mEq/L (ref 135–145)
Total Bilirubin: 0.7 mg/dL (ref 0.2–1.2)
Total Protein: 6.7 g/dL (ref 6.0–8.3)

## 2022-04-28 LAB — TSH: TSH: 2.8 u[IU]/mL (ref 0.35–5.50)

## 2022-04-28 LAB — VITAMIN D 25 HYDROXY (VIT D DEFICIENCY, FRACTURES): VITD: 108.04 ng/mL (ref 30.00–100.00)

## 2022-04-28 MED ORDER — TETANUS-DIPHTH-ACELL PERTUSSIS 5-2.5-18.5 LF-MCG/0.5 IM SUSP: 0.5000 mL | Freq: Once | INTRAMUSCULAR | 0 refills | Status: AC

## 2022-04-28 MED ORDER — SHINGRIX 50 MCG/0.5ML IM SUSR: 0.5000 mL | Freq: Once | INTRAMUSCULAR | 1 refills | Status: AC

## 2022-04-28 NOTE — Telephone Encounter (Signed)
Spoke with patient regarding results/recommendations.  

## 2022-04-28 NOTE — Patient Instructions (Signed)
No follow-ups on file.        Great to see you today.  I have refilled the medication(s) we provide.   If labs were collected, we will inform you of lab results once received either by echart message or telephone call.   - echart message- for normal results that have been seen by the patient already.   - telephone call: abnormal results or if patient has not viewed results in their echart. Health Maintenance After Age 69 After age 14, you are at a higher risk for certain long-term diseases and infections as well as injuries from falls. Falls are a major cause of broken bones and head injuries in people who are older than age 60. Getting regular preventive care can help to keep you healthy and well. Preventive care includes getting regular testing and making lifestyle changes as recommended by your health care provider. Talk with your health care provider about: Which screenings and tests you should have. A screening is a test that checks for a disease when you have no symptoms. A diet and exercise plan that is right for you. What should I know about screenings and tests to prevent falls? Screening and testing are the best ways to find a health problem early. Early diagnosis and treatment give you the best chance of managing medical conditions that are common after age 78. Certain conditions and lifestyle choices may make you more likely to have a fall. Your health care provider may recommend: Regular vision checks. Poor vision and conditions such as cataracts can make you more likely to have a fall. If you wear glasses, make sure to get your prescription updated if your vision changes. Medicine review. Work with your health care provider to regularly review all of the medicines you are taking, including over-the-counter medicines. Ask your health care provider about any side effects that may make you more likely to have a fall. Tell your health care provider if any medicines that you take make  you feel dizzy or sleepy. Strength and balance checks. Your health care provider may recommend certain tests to check your strength and balance while standing, walking, or changing positions. Foot health exam. Foot pain and numbness, as well as not wearing proper footwear, can make you more likely to have a fall. Screenings, including: Osteoporosis screening. Osteoporosis is a condition that causes the bones to get weaker and break more easily. Blood pressure screening. Blood pressure changes and medicines to control blood pressure can make you feel dizzy. Depression screening. You may be more likely to have a fall if you have a fear of falling, feel depressed, or feel unable to do activities that you used to do. Alcohol use screening. Using too much alcohol can affect your balance and may make you more likely to have a fall. Follow these instructions at home: Lifestyle Do not drink alcohol if: Your health care provider tells you not to drink. If you drink alcohol: Limit how much you have to: 0-1 drink a day for women. 0-2 drinks a day for men. Know how much alcohol is in your drink. In the [place] one drink equals one 12 oz bottle of beer (355 mL), one 5 oz glass of wine (148 mL), or one 1 oz glass of hard liquor (44 mL). Do not use any products that contain nicotine or tobacco. These products include cigarettes, chewing tobacco, and vaping devices, such as e-cigarettes. If you need help quitting, ask your health care provider. Activity  Follow a  regular exercise program to stay fit. This will help you maintain your balance. Ask your health care provider what types of exercise are appropriate for you. If you need a cane or walker, use it as recommended by your health care provider. Wear supportive shoes that have nonskid soles. Safety  Remove any tripping hazards, such as rugs, cords, and clutter. Install safety equipment such as grab bars in bathrooms and safety rails on stairs. Keep  rooms and walkways well-lit. General instructions Talk with your health care provider about your risks for falling. Tell your health care provider if: You fall. Be sure to tell your health care provider about all falls, even ones that seem minor. You feel dizzy, tiredness (fatigue), or off-balance. Take over-the-counter and prescription medicines only as told by your health care provider. These include supplements. Eat a healthy diet and maintain a healthy weight. A healthy diet includes low-fat dairy products, low-fat (lean) meats, and fiber from whole grains, beans, and lots of fruits and vegetables. Stay current with your vaccines. Schedule regular health, dental, and eye exams. Summary Having a healthy lifestyle and getting preventive care can help to protect your health and wellness after age 68. Screening and testing are the best way to find a health problem early and help you avoid having a fall. Early diagnosis and treatment give you the best chance for managing medical conditions that are more common for people who are older than age 56. Falls are a major cause of broken bones and head injuries in people who are older than age 65. Take precautions to prevent a fall at home. Work with your health care provider to learn what changes you can make to improve your health and wellness and to prevent falls. This information is not intended to replace advice given to you by your health care provider. Make sure you discuss any questions you have with your health care provider. Document Revised: 12/29/2020 Document Reviewed: 12/29/2020 Elsevier Patient Education  [place].

## 2022-04-28 NOTE — Telephone Encounter (Signed)
Please inform patient her vitamin D level is critically high.  Vitamin D at high levels can be toxic.  This very well may be why she was having memory issues as well. Stop all vitamin D supplements ASAP and follow-up in 3 months. Vitamin D levels take a while to come back down into normal range.  We will see him in 3 months and retest at that time.  May take as long as 6 months to get into normal range since hers was so high (108). Hydrate. Once vitamin D is safely in normal range, we will guide on appropriate vitamin D dose.  Rest of her labs are pending

## 2022-04-28 NOTE — Telephone Encounter (Signed)
Spoke with Roger Kill from [facility] lab calling to report a Critical vitamin  D level of 108.04.   Please review and advise

## 2022-04-28 NOTE — Progress Notes (Signed)
Patient ID: Tiffany Bender, female  DOB: 01/18/1951, 71 y.o.   MRN: [id number] Patient Care Team    Relationship Specialty Notifications Start End  Ma Hillock, DO PCP - General Family Medicine  02/17/21     Chief Complaint  Patient presents with   Hyperlipidemia    Pt is fasting    Subjective: Tiffany Bender is a 71 y.o.  Female  present for [facility] All past medical history, surgical history, allergies, family history, immunizations, medications and social history were updated in the electronic medical record today. All recent labs, ED visits and hospitalizations within the last year were reviewed.  Health maintenance:  Colonoscopy: completed 2016-10 yr. [facility]. Had been with Dr. Carlean Purl in the past.  Mammogram: completed:12/2021- [facility] Immunizations: tdap printed, Influenza declined (encouraged yearly), PNA series administered today, zostavax printed - discussed she may have not had chx pox.  DEXA: last completed 12/2021 (-1.4-osteopenia)     11/03/2021    1:45 PM 02/17/2021   11:17 AM 05/31/2013   10:07 AM  Depression screen PHQ 2/9  Decreased Interest 0 0 2  Down, Depressed, Hopeless 1 0 1  PHQ - 2 Score 1 0 3  Altered sleeping   1  Tired, decreased energy   3  Change in appetite   3  Feeling bad or failure about yourself    0  Trouble concentrating   1  Moving slowly or fidgety/restless   0  Suicidal thoughts   0  PHQ-9 Score   11       No data to display           Immunization History  Administered Date(s) Administered   Influenza Split 07/29/2011, 08/11/2012   Influenza Whole 06/23/2005, 06/26/2007   Influenza-Unspecified 06/08/2013   PNEUMOCOCCAL CONJUGATE-20 04/28/2022   Td 06/23/2004   Tdap 05/19/2012     Past Medical History:  Diagnosis Date   DDD (degenerative disc disease), lumbar    Dyspareunia in female 08/21/2015   KNEE PAIN, RIGHT 07/20/2006   Qualifier: Diagnosis of  By: Madilyn Fireman MD, Catherine     LIVER FUNCTION TESTS,  ABNORMAL 09/28/2007   Qualifier: Diagnosis of  By: Wynetta Emery LPN, Kim     Neck pain    Osteopenia    Other and unspecified ovarian cysts    history   Vaginal atrophy 09/02/2021   Allergies  Allergen Reactions   Ciprofloxacin Other (See Comments) and Swelling    Pelvic swelling Other reaction(s): Other Swelling genitalia, high fever. Went to ED     Fenofibrate Other (See Comments)   Morphine Rash   Penicillins Other (See Comments)    Head ache. rash Headaches.     Statins Other (See Comments)    Dark brown urine.  Dark unrine Other reaction(s): Other Dark brown urine.  Urine got really dark Has tried Crestor and Lipitor    Sulfamethoxazole-Trimethoprim Other (See Comments)    Passed out, Eyes Swollen   Tizanidine Hcl Other (See Comments)    Dizzy, weak, throat swelling    Amoxicillin     headache   Dexamethasone Nausea Only    dizzy   Latex Itching and Rash   Past Surgical History:  Procedure Laterality Date   ARTHROSCOPIC REPAIR ACL     RT knee   BUNIONECTOMY  08/23/1992   bilateral   CARPAL TUNNEL RELEASE Right    CARPAL TUNNEL RELEASE  08/23/1998   Left    OVARIAN CYST REMOVAL  08/23/1986  left    POSTERIOR LUMBAR LAMINECTOMY L4-S1 AND FUSION WITH INSTRUMENTATION, L4-L5 TRANSFORAMINAL LUMBAR INTERBODY FUSION AND BONE   04/20/2021   TONSILLECTOMY     right   TUBAL LIGATION     VEIN LIGATION AND STRIPPING  08/24/1979   Family History  Adopted: Yes  Problem Relation Age of Onset   Alcohol abuse Father    Social History   Social History Narrative   No regular exercise. 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 She was adopted at age 27. She does drink coffee daily. She has 2 biological children and 2 adopted children.    Allergies as of 04/28/2022       Reactions   Ciprofloxacin Other (See Comments), Swelling   Pelvic swelling Other reaction(s): Other Swelling genitalia, high fever. Went to ED    Fenofibrate Other (See Comments)    Morphine Rash   Penicillins Other (See Comments)   Head ache. rash Headaches.    Statins Other (See Comments)   Dark brown urine.  Dark unrine Other reaction(s): Other Dark brown urine.  Urine got really dark Has tried Crestor and Lipitor    Sulfamethoxazole-trimethoprim Other (See Comments)   Passed out, Eyes Swollen   Tizanidine Hcl Other (See Comments)   Dizzy, weak, throat swelling    Amoxicillin    headache   Dexamethasone Nausea Only   dizzy   Latex Itching, Rash        Medication List        Accurate as of April 28, 2022 12:00 PM. If you have any questions, ask your nurse or doctor.          ascorbic acid 500 MG tablet Commonly known as: VITAMIN C Take 1,000 mg by mouth.   aspirin EC 81 MG tablet Take 81 mg by mouth daily. Swallow whole.   BORON PO Take 6 mg by mouth.   Cholecalciferol 125 MCG (5000 UT) Tabs Take 1 tablet by mouth daily.   Cranberry 405 MG Caps 870 mg.   hydrocortisone 1 % ointment Apply 1 application topically 2 (two) times daily as needed for itching.   Magnesium 100 MG Caps Take 1 capsule by mouth at bedtime.   Probiotic Tbec Take 250 mg by mouth See admin instructions.   Shingrix injection Generic drug: Zoster Vaccine Adjuvanted Inject 0.5 mLs into the muscle once for 1 dose. Started by: Howard Pouch, DO   Tdap 5-2.5-18.5 LF-MCG/0.5 injection Commonly known as: BOOSTRIX Inject 0.5 mLs into the muscle once for 1 dose. Started by: Howard Pouch, DO   vitamin E 180 MG (400 UNITS) capsule Take 400 Units by mouth daily.        All past medical history, surgical history, allergies, family history, immunizations andmedications were updated in the EMR today and reviewed under the history and medication portions of their EMR.     No results found for this or any previous visit (from the past 2160 hour(s)).  MM 3D SCREEN BREAST BILATERAL Result Date: 01/01/2022  BI-RADS CATEGORY  1: Negative. Electronically Signed    By: Ileana Roup M.D.   On: 01/01/2022 13:22    ROS 14 pt review of systems performed and negative (unless mentioned in an HPI)  Objective: BP 109/65   Pulse 63   Temp 97.8 F (36.6 C) (Oral)   Ht 5' 3.5" (1.613 m)   Wt 138 lb (62.6 kg)   SpO2 100%   BMI 24.06 kg/m  Physical Exam Vitals and nursing  note reviewed.  Constitutional:      General: She is not in acute distress.    Appearance: Normal appearance. She is not ill-appearing or toxic-appearing.  HENT:     Head: Normocephalic and atraumatic.     Right Ear: Tympanic membrane, ear canal and external ear normal. There is no impacted cerumen.     Left Ear: Tympanic membrane, ear canal and external ear normal. There is no impacted cerumen.     Nose: No congestion or rhinorrhea.     Mouth/Throat:     Mouth: Mucous membranes are moist.     Pharynx: Oropharynx is clear. No oropharyngeal exudate or posterior oropharyngeal erythema.  Eyes:     General: No scleral icterus.       Right eye: No discharge.        Left eye: No discharge.     Extraocular Movements: Extraocular movements intact.     Conjunctiva/sclera: Conjunctivae normal.     Pupils: Pupils are equal, round, and reactive to light.  Cardiovascular:     Rate and Rhythm: Normal rate and regular rhythm.     Pulses: Normal pulses.     Heart sounds: Normal heart sounds. No murmur heard.    No friction rub. No gallop.  Pulmonary:     Effort: Pulmonary effort is normal. No respiratory distress.     Breath sounds: Normal breath sounds. No stridor. No wheezing, rhonchi or rales.  Chest:     Chest wall: No tenderness.  Abdominal:     General: Abdomen is flat. Bowel sounds are normal. There is no distension.     Palpations: Abdomen is soft. There is no mass.     Tenderness: There is no abdominal tenderness. There is no right CVA tenderness, left CVA tenderness, guarding or rebound.     Hernia: No hernia is present.  Musculoskeletal:        General: No swelling,  tenderness or deformity. Normal range of motion.     Cervical back: Normal range of motion and neck supple. No rigidity or tenderness.     Right lower leg: No edema.     Left lower leg: No edema.  Lymphadenopathy:     Cervical: No cervical adenopathy.  Skin:    General: Skin is warm and dry.     Coloration: Skin is not jaundiced or pale.     Findings: No bruising, erythema, lesion or rash.  Neurological:     General: No focal deficit present.     Mental Status: She is alert and oriented to person, place, and time. Mental status is at baseline.     Cranial Nerves: No cranial nerve deficit.     Sensory: No sensory deficit.     Motor: No weakness.     Coordination: Coordination normal.     Gait: Gait normal.     Deep Tendon Reflexes: Reflexes normal.  Psychiatric:        Mood and Affect: Mood normal.        Behavior: Behavior normal.        Thought Content: Thought content normal.        Judgment: Judgment normal.        No results found.  Assessment/plan: Nekayla Heider is a 71 y.o. female present for  Mixed hyperlipidemia/Statin intolerance Diet and exercise modifications.  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   Orders Placed This Encounter  Procedures   MM 3D SCREEN BREAST BILATERAL   Pneumococcal conjugate vaccine 20-valent (Prevnar 20)   TSH   Lipid panel   Vitamin D (25 hydroxy)   Comp Met (CMET)   CBC   Meds ordered this encounter  Medications   Tdap (BOOSTRIX) 5-2.5-18.5 LF-MCG/0.5 injection    Sig: Inject 0.5 mLs into  the muscle once for 1 dose.    Dispense:  0.5 mL    Refill:  0   Zoster Vaccine Adjuvanted [facility]) injection    Sig: Inject 0.5 mLs into the muscle once for 1 dose.    Dispense:  1 each    Refill:  1   Referral Orders  No referral(s) requested today     Electronically signed by: Howard Pouch, [place]

## 2022-04-29 ENCOUNTER — Telehealth: Payer: Self-pay | Admitting: Family Medicine

## 2022-04-29 NOTE — Telephone Encounter (Signed)
Spoke with pt regarding labs and instructions. Pt declines medications.

## 2022-04-29 NOTE — Telephone Encounter (Signed)
Please call patient Tiffany Bender, Tiffany Bender and Tiffany Bender function are normal Blood cell counts and electrolytes are normal Sugar/glucose is normal. Cholesterol panel is above goal.  With a LDL bad cholesterol of 166, however this is better than prior LDL.  She has declined statin medications in the past.   -There are medications that are not statin that can help bring down the LDL into goal which would be at least less than 130 for her.  If she is agreeable to start Zetia, I will call this in for her. -Of course continuing the dietary modifications and routine exercise is going to be very helpful.  She has done well over the years and the LDL is lower now, but still above goal for her.  As we discussed during her visit, some of this is very likely genetic and will likely require help by medication to get cholesterol into a safer range for her.

## 2022-05-07 ENCOUNTER — Telehealth: Payer: Self-pay

## 2022-05-07 NOTE — Telephone Encounter (Signed)
Patient states she was seen here by Dr. Raoul Pitch and she had brought with her xray w/results of neck.  She is wanting to know Dr. Lucita Lora thoughts on the results. She is wanting to go back to work, and is not sure if should wait and take care of neck issues first. She asked if PT could be an option. I could not find anything in her chart about xray.  Please advise.  [phone]

## 2022-05-07 NOTE — Telephone Encounter (Signed)
Patient brought a photo copy, of images of her cervical spine to her appointment for chronic conditions.. To be safe, I would recommend she discuss management with an orthopedic doctor.  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 Sorry we cannot be more helpful. FYI: We placed it in the scan pile and it has been uploaded.

## 2022-05-07 NOTE — Telephone Encounter (Signed)
Please advise 

## 2022-05-11 NOTE — Telephone Encounter (Signed)
Pt wants to know if she can wait on surgery. She states that spine and scoliosis dr wanted to do MRI but she would have to do PT which she has done before and did not find it helpful. States MRI was last ordered by PCP. Please advise

## 2022-05-11 NOTE — Telephone Encounter (Signed)
Genera  can you please call patient  Can defer to the orthopedic team.  We completed an MRI for her in 2022.  We then referred her for consultation to the specialty team for their recommendations. I would defer to their recommendations since they are the specialist in this area.  We discussed this briefly during her appointment , and I recommended that if they ask her to do physical therapy, then she at least try physical therapy again, so they can order the MRI if it proves needed.  I understand she did not think physical therapy was helpful the first time, but it may benefit this time, and sometimes that is what is required  to place an order for further imaging studies and have an insurance approve them.

## 2022-05-13 NOTE — Telephone Encounter (Signed)
Spoke with patient and advised of provider note. Patient was informed to contact ortho office she was referred to as they are recommending PT prior to having an MRI. Patient understood and stated she will call.

## 2022-05-17 ENCOUNTER — Telehealth: Payer: Self-pay | Admitting: Family Medicine

## 2022-05-17 NOTE — Telephone Encounter (Signed)
Spoke with pt re vit d. Pt was instructed to continue holding the vit d per telephone note 09/06 from Dr. Raoul Pitch. Pt was informed that she will need to sched appt for vit d.

## 2022-05-17 NOTE — Telephone Encounter (Signed)
Patient called back wanting to know where Dr. Raoul Pitch sends patients for xrays.  Patient states she was taking too much Vitamin D, and could she start taking 1 daily again.

## 2022-05-17 NOTE — Telephone Encounter (Signed)
Pt would like to discuss if she may need to have an appointment about her right thumb. She is unsure if this is related to being taken off vitamin D3. She would like the medical assistant to give her a call to discuss. She denied scheduling an appointment at this time.

## 2022-05-26 DIAGNOSIS — M4722 Other spondylosis with radiculopathy, cervical region: Secondary | ICD-10-CM | POA: Diagnosis not present

## 2022-05-26 DIAGNOSIS — M542 Cervicalgia: Secondary | ICD-10-CM | POA: Diagnosis not present

## 2022-05-28 DIAGNOSIS — M180 Bilateral primary osteoarthritis of first carpometacarpal joints: Secondary | ICD-10-CM | POA: Diagnosis not present

## 2022-05-28 DIAGNOSIS — M79645 Pain in left finger(s): Secondary | ICD-10-CM | POA: Diagnosis not present

## 2022-05-28 DIAGNOSIS — M79644 Pain in right finger(s): Secondary | ICD-10-CM | POA: Diagnosis not present

## 2022-06-01 DIAGNOSIS — M542 Cervicalgia: Secondary | ICD-10-CM | POA: Diagnosis not present

## 2022-06-01 DIAGNOSIS — M4722 Other spondylosis with radiculopathy, cervical region: Secondary | ICD-10-CM | POA: Diagnosis not present

## 2022-06-03 DIAGNOSIS — M542 Cervicalgia: Secondary | ICD-10-CM | POA: Diagnosis not present

## 2022-06-03 DIAGNOSIS — M4722 Other spondylosis with radiculopathy, cervical region: Secondary | ICD-10-CM | POA: Diagnosis not present

## 2022-06-08 DIAGNOSIS — M4722 Other spondylosis with radiculopathy, cervical region: Secondary | ICD-10-CM | POA: Diagnosis not present

## 2022-06-08 DIAGNOSIS — M542 Cervicalgia: Secondary | ICD-10-CM | POA: Diagnosis not present

## 2022-06-10 DIAGNOSIS — M4722 Other spondylosis with radiculopathy, cervical region: Secondary | ICD-10-CM | POA: Diagnosis not present

## 2022-06-10 DIAGNOSIS — M542 Cervicalgia: Secondary | ICD-10-CM | POA: Diagnosis not present

## 2022-06-16 DIAGNOSIS — M4722 Other spondylosis with radiculopathy, cervical region: Secondary | ICD-10-CM | POA: Diagnosis not present

## 2022-06-16 DIAGNOSIS — M542 Cervicalgia: Secondary | ICD-10-CM | POA: Diagnosis not present

## 2022-06-22 DIAGNOSIS — M542 Cervicalgia: Secondary | ICD-10-CM | POA: Diagnosis not present

## 2022-06-22 DIAGNOSIS — M4722 Other spondylosis with radiculopathy, cervical region: Secondary | ICD-10-CM | POA: Diagnosis not present

## 2022-06-24 DIAGNOSIS — M542 Cervicalgia: Secondary | ICD-10-CM | POA: Diagnosis not present

## 2022-06-24 DIAGNOSIS — M4722 Other spondylosis with radiculopathy, cervical region: Secondary | ICD-10-CM | POA: Diagnosis not present

## 2022-07-06 ENCOUNTER — Other Ambulatory Visit: Payer: Self-pay | Admitting: Orthopedic Surgery

## 2022-07-06 DIAGNOSIS — M4722 Other spondylosis with radiculopathy, cervical region: Secondary | ICD-10-CM | POA: Diagnosis not present

## 2022-07-06 DIAGNOSIS — M4326 Fusion of spine, lumbar region: Secondary | ICD-10-CM | POA: Diagnosis not present

## 2022-07-06 DIAGNOSIS — M4726 Other spondylosis with radiculopathy, lumbar region: Secondary | ICD-10-CM | POA: Diagnosis not present

## 2022-07-20 ENCOUNTER — Telehealth: Payer: Self-pay | Admitting: Family Medicine

## 2022-07-20 NOTE — Telephone Encounter (Addendum)
Pt has an appt scheduled on Dec. 6 however she wanted this message  relayed and have someone return her call. She reports that since Aug. She has noticed her bowel movements have began to float in the toilet. She reports that she is concerned about this and is curious if it has anything to do with the vitamin D.

## 2022-07-25 ENCOUNTER — Other Ambulatory Visit: Payer: Medicare Other

## 2022-07-28 ENCOUNTER — Encounter: Payer: Self-pay | Admitting: Family Medicine

## 2022-07-28 ENCOUNTER — Ambulatory Visit (INDEPENDENT_AMBULATORY_CARE_PROVIDER_SITE_OTHER): Payer: Medicare Other | Admitting: Family Medicine

## 2022-07-28 VITALS — BP 121/69 | HR 63 | Temp 98.5°F | Ht 63.5 in | Wt 139.0 lb

## 2022-07-28 DIAGNOSIS — R413 Other amnesia: Secondary | ICD-10-CM | POA: Diagnosis not present

## 2022-07-28 DIAGNOSIS — E673 Hypervitaminosis D: Secondary | ICD-10-CM

## 2022-07-28 LAB — VITAMIN D 25 HYDROXY (VIT D DEFICIENCY, FRACTURES): VITD: 59.68 ng/mL (ref 30.00–100.00)

## 2022-07-28 NOTE — Progress Notes (Signed)
Tiffany Bender , [birth year], 71 y.o., female MRN: [id number] Patient Care Team    Relationship Specialty Notifications Start End  Ma Hillock, DO PCP - General Family Medicine  02/17/21     Chief Complaint  Patient presents with   vit d def    Pt is fasting      Subjective: Pt presents for an OV to follow up on her hypervitaminosis D. She had been experiencing memory changes, which may have been from the elevated vit D. She had been taking 10,000u vit d  since april- her vit d level was 108, 3 months ago. She has stopped all vit d supplementation.       07/28/2022    8:38 AM 11/03/2021    1:45 PM 02/17/2021   11:17 AM 05/31/2013   10:07 AM  Depression screen PHQ 2/9  Decreased Interest 0 0 0 2  Down, Depressed, Hopeless 0 1 0 1  PHQ - 2 Score 0 1 0 3  Altered sleeping    1  Tired, decreased energy    3  Change in appetite    3  Feeling bad or failure about yourself     0  Trouble concentrating    1  Moving slowly or fidgety/restless    0  Suicidal thoughts    0  PHQ-9 Score    11    Allergies  Allergen Reactions   Ciprofloxacin Other (See Comments) and Swelling    Pelvic swelling Other reaction(s): Other Swelling genitalia, high fever. Went to ED     Fenofibrate Other (See Comments)   Morphine Rash   Penicillins Other (See Comments)    Head ache. rash Headaches.     Statins Other (See Comments)    Dark brown urine.  Dark unrine Other reaction(s): Other Dark brown urine.  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 She was adopted at age 5. She does drink coffee daily.  She has 2 biological children and 2 adopted children.   Past Medical History:  Diagnosis Date   DDD (degenerative disc disease), lumbar    Dyspareunia in female 08/21/2015   Fatty liver    KNEE PAIN, RIGHT 07/20/2006   Qualifier: Diagnosis of  By: Madilyn Fireman MD, Catherine     LIVER FUNCTION TESTS, ABNORMAL 09/28/2007   Qualifier: Diagnosis of  By: Wynetta Emery LPN, Kim     Neck pain    Osteopenia    Other and unspecified ovarian cysts    history   Vaginal atrophy 09/02/2021   Past Surgical History:  Procedure Laterality Date   ARTHROSCOPIC REPAIR ACL     RT knee   BUNIONECTOMY  08/23/1992   bilateral   CARPAL TUNNEL RELEASE Right    CARPAL TUNNEL RELEASE  08/23/1998   Left    OVARIAN CYST REMOVAL  08/23/1986   left    POSTERIOR LUMBAR LAMINECTOMY L4-S1 AND FUSION WITH INSTRUMENTATION, L4-L5 TRANSFORAMINAL LUMBAR INTERBODY FUSION AND BONE   04/20/2021   TONSILLECTOMY     right  TUBAL LIGATION     VEIN LIGATION AND STRIPPING  08/24/1979   Family History  Adopted: Yes  Problem Relation Age of Onset   Alcohol abuse Father    Allergies as of 07/28/2022       Reactions   Ciprofloxacin Other (See Comments), Swelling   Pelvic swelling Other reaction(s): Other Swelling genitalia, high fever. Went to ED    Fenofibrate Other (See Comments)   Morphine Rash   Penicillins Other (See Comments)   Head ache. rash Headaches.    Statins Other (See Comments)   Dark brown urine.  Dark unrine Other reaction(s): Other Dark brown urine.  Urine got really dark Has tried Crestor and Lipitor    Sulfamethoxazole-trimethoprim Other (See Comments)   Passed out, Eyes Swollen   Tizanidine Hcl Other (See Comments)   Dizzy, weak, throat swelling    Amoxicillin    headache   Dexamethasone Nausea Only   dizzy   Latex Itching, Rash        Medication List        Accurate as of July 28, 2022  8:59 AM. If you have any questions, ask your nurse or doctor.          STOP taking  these medications    Cholecalciferol 125 MCG (5000 UT) Tabs Stopped by: Howard Pouch, DO       TAKE these medications    ascorbic acid 500 MG tablet Commonly known as: VITAMIN C Take 1,000 mg by mouth.   aspirin EC 81 MG tablet Take 81 mg by mouth daily. Swallow whole.   BORON PO Take 6 mg by mouth.   Cranberry 405 MG Caps 870 mg.   hydrocortisone 1 % ointment Apply 1 application topically 2 (two) times daily as needed for itching.   Magnesium 100 MG Caps Take 1 capsule by mouth at bedtime.   Probiotic Tbec Take 250 mg by mouth See admin instructions.   vitamin E 180 MG (400 UNITS) capsule Take 400 Units by mouth daily.        All past medical history, surgical history, allergies, family history, immunizations andmedications were updated in the EMR today and reviewed under the history and medication portions of their EMR.     ROS Negative, with the exception of above mentioned in HPI   Objective:  BP 121/69   Pulse 63   Temp 98.5 F (36.9 C) (Oral)   Ht 5' 3.5" (1.613 m)   Wt 139 lb (63 kg)   SpO2 100%   BMI 24.24 kg/m  Body mass index is 24.24 kg/m. Physical Exam Vitals and nursing note reviewed.  Constitutional:      General: She is not in acute distress.    Appearance: Normal appearance. She is normal weight. She is not ill-appearing or toxic-appearing.  HENT:     Head: Normocephalic and atraumatic.  Eyes:     Extraocular Movements: Extraocular movements intact.     Conjunctiva/sclera: Conjunctivae normal.     Pupils: Pupils are equal, round, and reactive to light.  Cardiovascular:     Rate and Rhythm: Normal rate.  Pulmonary:     Effort: Pulmonary effort is normal.  Skin:    Findings: No erythema, lesion or rash.  Neurological:     Mental Status: She is alert and oriented to person, place, and time. Mental status is at baseline.  Psychiatric:        Mood and Affect: Mood normal.        Behavior: Behavior  normal.        Thought  Content: Thought content normal.        Judgment: Judgment normal.    No results found. No results found. No results found for this or any previous visit (from the past 24 hour(s)).  Assessment/Plan: [name] is a 71 y.o. female present for OV for  Hypervitaminosis D/memory changes - can continue neuro-memory supplement (no Vit d contents) - VITAMIN D 25 Hydroxy (Vit-D Deficiency, Fractures) - reports b12 b-made her bloated, it was subl tab.  Once lab result received will guide her on vit d dosing.   Reviewed expectations re: course of current medical issues. Discussed self-management of symptoms. Outlined signs and symptoms indicating need for more acute intervention. Patient verbalized understanding and all questions were answered. Patient received an After-Visit Summary.    Orders Placed This Encounter  Procedures   VITAMIN D 25 Hydroxy (Vit-D Deficiency, Fractures)   No orders of the defined types were placed in this encounter.  Referral Orders  No referral(s) requested today     Note is dictated utilizing voice recognition software. Although note has been proof read prior to signing, occasional typographical errors still can be missed. If any questions arise, please do not hesitate to call for verification.   electronically signed by:  Howard Pouch, DO  [place]

## 2022-07-28 NOTE — Patient Instructions (Signed)
No follow-ups on file.        Great to see you today.  I have refilled the medication(s) we provide.   If labs were collected, we will inform you of lab results once received either by echart message or telephone call.   - echart message- for normal results that have been seen by the patient already.   - telephone call: abnormal results or if patient has not viewed results in their echart.  

## 2022-08-06 DIAGNOSIS — M5011 Cervical disc disorder with radiculopathy,  high cervical region: Secondary | ICD-10-CM | POA: Diagnosis not present

## 2022-08-06 DIAGNOSIS — M50122 Cervical disc disorder at C5-C6 level with radiculopathy: Secondary | ICD-10-CM | POA: Diagnosis not present

## 2022-08-06 DIAGNOSIS — M50121 Cervical disc disorder at C4-C5 level with radiculopathy: Secondary | ICD-10-CM | POA: Diagnosis not present

## 2022-08-06 DIAGNOSIS — M50123 Cervical disc disorder at C6-C7 level with radiculopathy: Secondary | ICD-10-CM | POA: Diagnosis not present

## 2022-08-06 DIAGNOSIS — M4802 Spinal stenosis, cervical region: Secondary | ICD-10-CM | POA: Diagnosis not present

## 2022-08-06 DIAGNOSIS — M4722 Other spondylosis with radiculopathy, cervical region: Secondary | ICD-10-CM | POA: Diagnosis not present

## 2022-08-10 DIAGNOSIS — M50122 Cervical disc disorder at C5-C6 level with radiculopathy: Secondary | ICD-10-CM | POA: Diagnosis not present

## 2022-08-10 DIAGNOSIS — M50123 Cervical disc disorder at C6-C7 level with radiculopathy: Secondary | ICD-10-CM | POA: Diagnosis not present

## 2022-08-30 DIAGNOSIS — M542 Cervicalgia: Secondary | ICD-10-CM | POA: Diagnosis not present

## 2022-08-30 DIAGNOSIS — M4722 Other spondylosis with radiculopathy, cervical region: Secondary | ICD-10-CM | POA: Diagnosis not present

## 2022-09-02 IMAGING — MG MM DIGITAL DIAGNOSTIC UNILAT*L* W/ TOMO W/ CAD
6 series · 6 of 18 positions shown · non-contrast
Comparison: Previous exam(s).

CLINICAL DATA: Patient presents for diffuse left breast pain.

EXAM:
DIGITAL DIAGNOSTIC UNILATERAL LEFT MAMMOGRAM WITH TOMOSYNTHESIS AND
CAD
TECHNIQUE: Left digital diagnostic mammography and breast tomosynthesis was
performed. The images were evaluated with computer-aided detection.

[L MLO synth-2D]
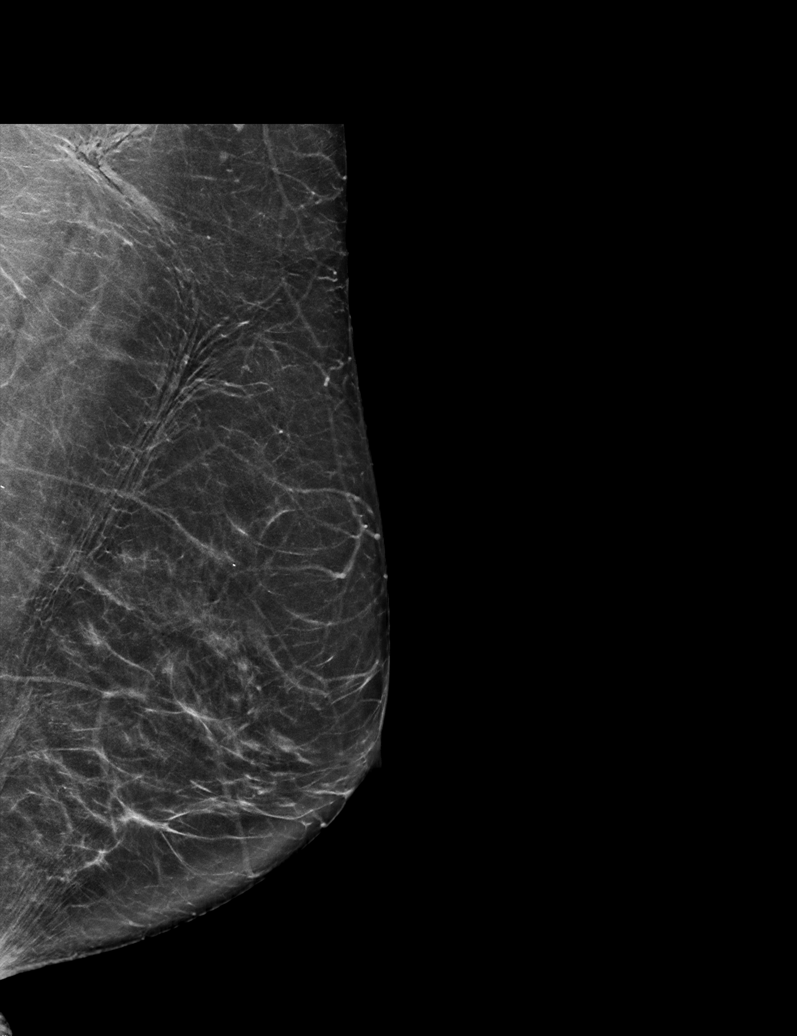

[L CC synth-2D]
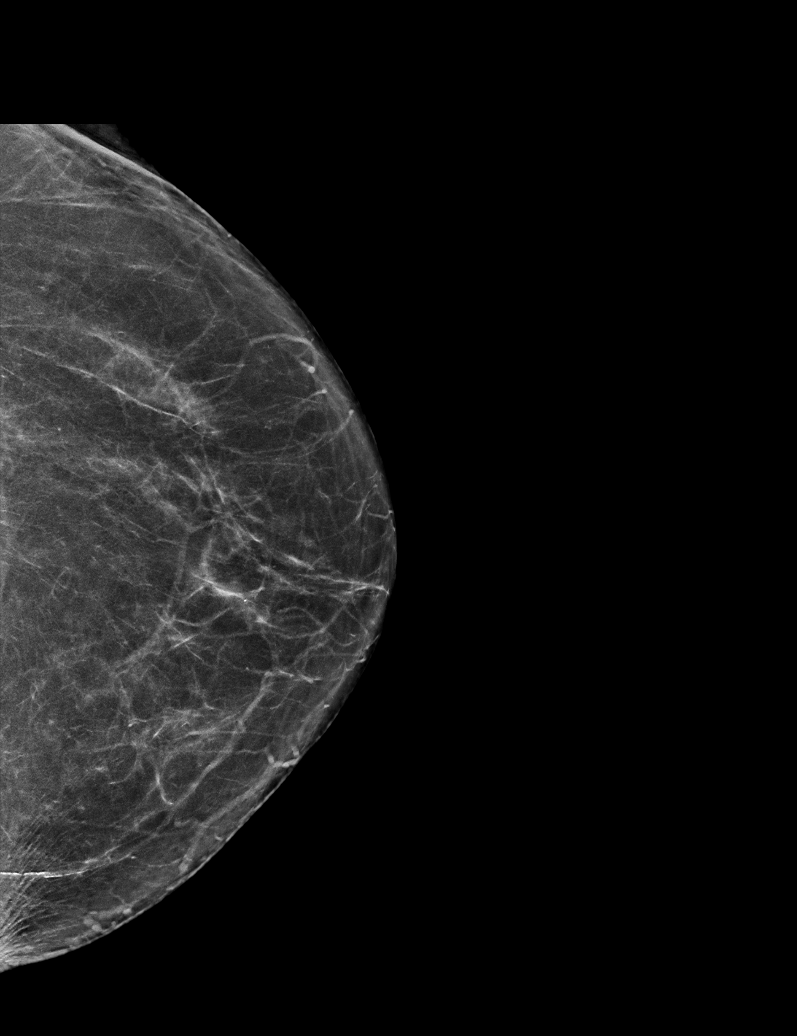

[L ML synth-2D]
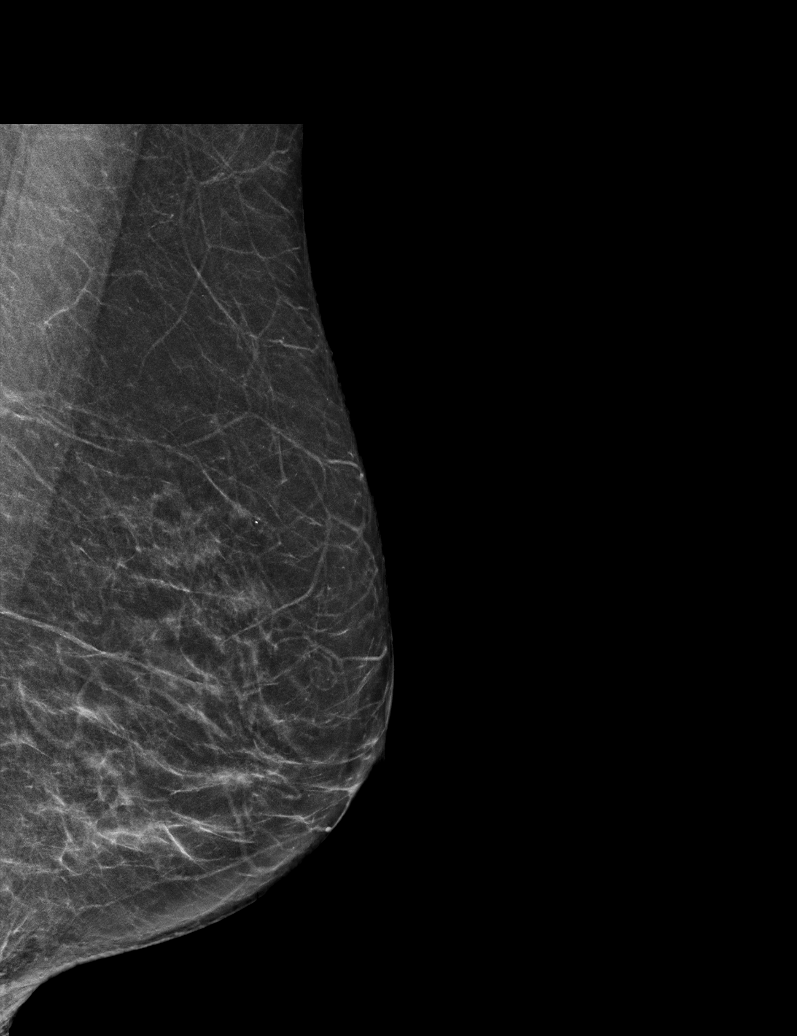

[L CC tomo · tomo slice 32/63.0]
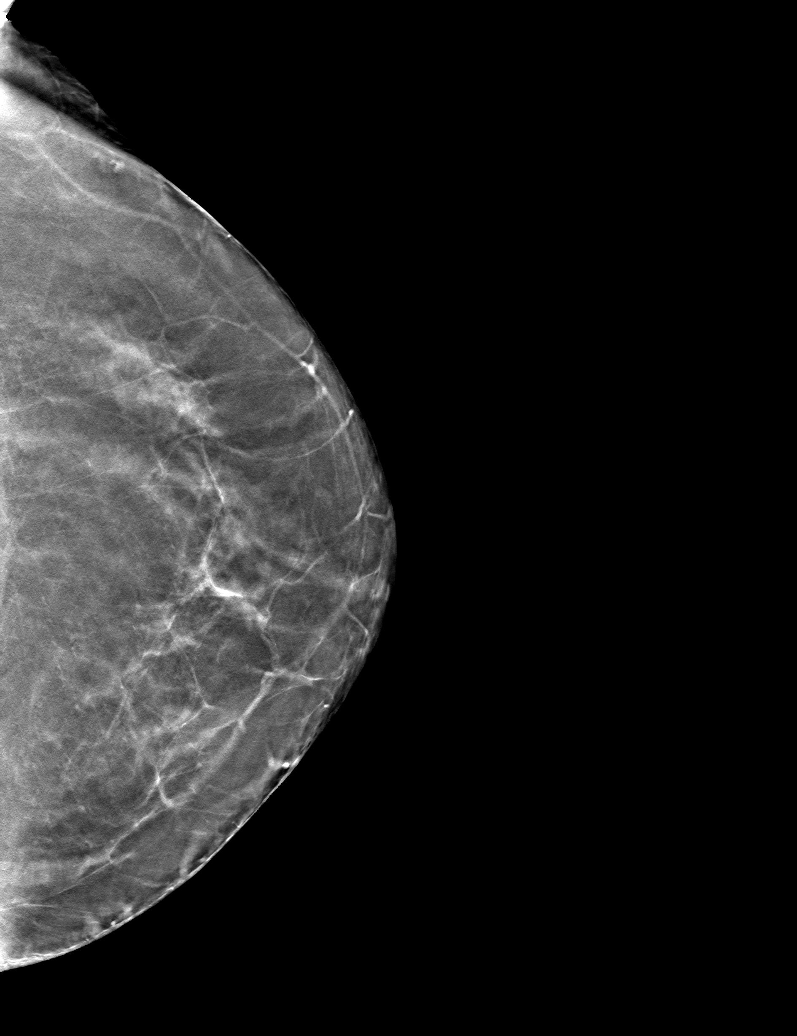

[L MLO tomo · tomo slice 33/64.0]
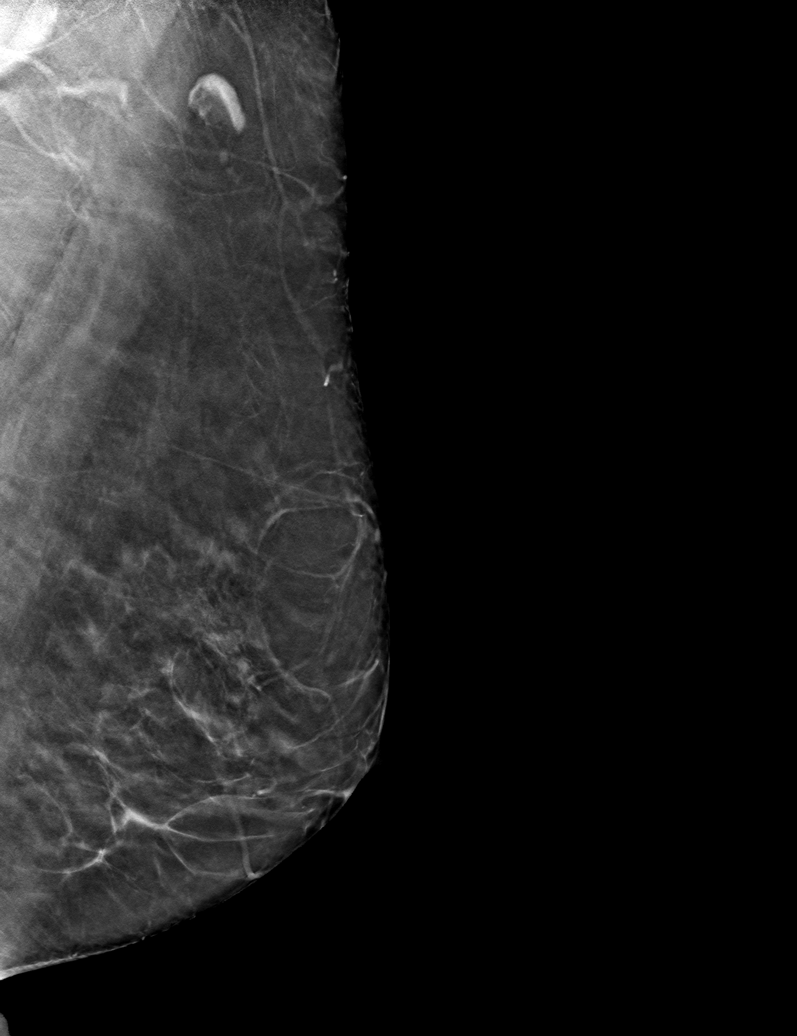

[L ML tomo · tomo slice 27/54.0]
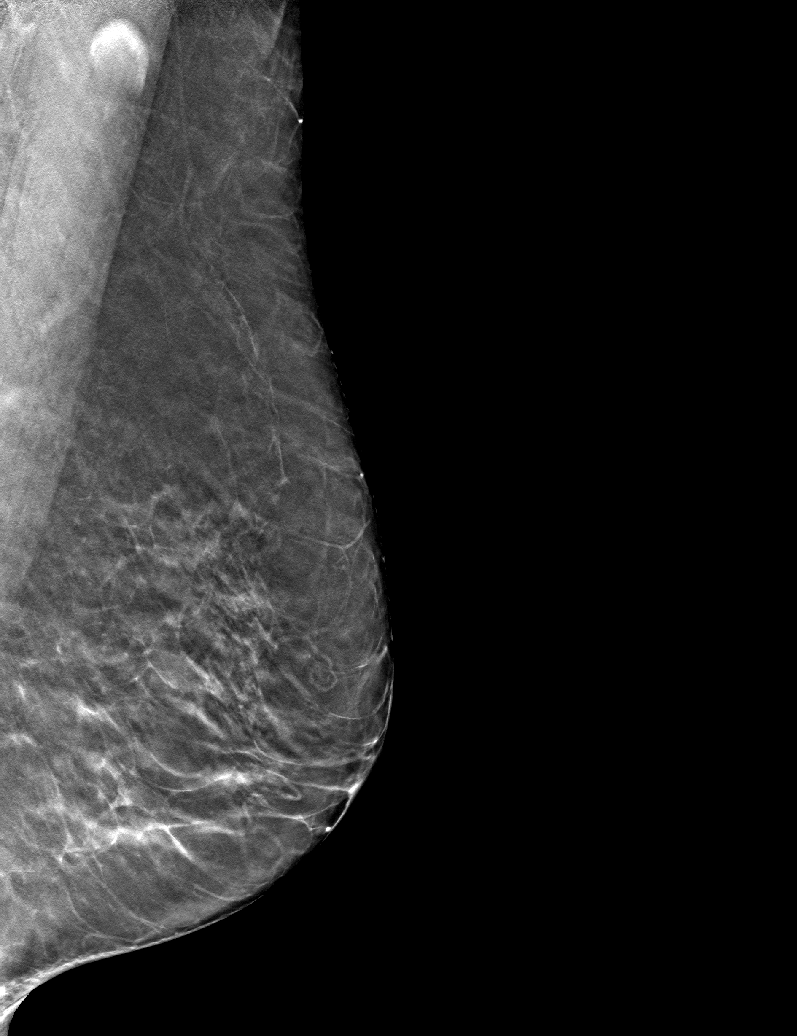

[6 of 18 positions shown; findings below may reference images not displayed]

ACR Breast Density Category b: There are scattered areas of
fibroglandular density.
FINDINGS: No concerning masses, calcifications or distortion identified within
the left breast.
IMPRESSION: No mammographic evidence for malignancy.

RECOMMENDATION:
Continued clinical evaluation for left breast pain.

Return to annual screening mammography [DATE].

I have discussed the findings and recommendations with the patient.
If applicable, a reminder letter will be sent to the patient
regarding the next appointment.

BI-RADS CATEGORY  1: Negative.

## 2022-09-17 DIAGNOSIS — H25813 Combined forms of age-related cataract, bilateral: Secondary | ICD-10-CM | POA: Diagnosis not present

## 2022-09-17 DIAGNOSIS — H526 Other disorders of refraction: Secondary | ICD-10-CM | POA: Diagnosis not present

## 2022-09-17 DIAGNOSIS — H524 Presbyopia: Secondary | ICD-10-CM | POA: Diagnosis not present

## 2022-09-17 DIAGNOSIS — H02402 Unspecified ptosis of left eyelid: Secondary | ICD-10-CM | POA: Diagnosis not present

## 2022-09-17 DIAGNOSIS — H04123 Dry eye syndrome of bilateral lacrimal glands: Secondary | ICD-10-CM | POA: Diagnosis not present

## 2022-09-17 DIAGNOSIS — H40003 Preglaucoma, unspecified, bilateral: Secondary | ICD-10-CM | POA: Diagnosis not present

## 2022-09-17 DIAGNOSIS — H43813 Vitreous degeneration, bilateral: Secondary | ICD-10-CM | POA: Diagnosis not present

## 2022-11-06 DIAGNOSIS — R197 Diarrhea, unspecified: Secondary | ICD-10-CM | POA: Diagnosis not present

## 2022-11-06 DIAGNOSIS — R55 Syncope and collapse: Secondary | ICD-10-CM | POA: Diagnosis not present

## 2022-11-08 ENCOUNTER — Telehealth: Payer: Self-pay | Admitting: Family Medicine

## 2022-11-08 NOTE — Telephone Encounter (Signed)
Started supplement 16-17 days ago.   Please schedule on 03/20 at 11:40

## 2022-11-08 NOTE — Telephone Encounter (Signed)
Pt called and declined scheduling an appointment. She did not want to schedule a hospital follow up until Dr. Raoul Pitch receives her test results. Tiffany Bender reports that on Sat 11/06/22 while attending Mass at church she passed out twice, but did not hit her head or anything. She reports that she began to get to get cold and then passed out. She believes the Ashwagandha supplement may have caused this.  She went to the emergency department at [facility]. The number she says to call for results is [phone]

## 2022-11-10 ENCOUNTER — Encounter: Payer: Self-pay | Admitting: Family Medicine

## 2022-11-10 ENCOUNTER — Ambulatory Visit (INDEPENDENT_AMBULATORY_CARE_PROVIDER_SITE_OTHER): Payer: Medicare Other | Admitting: Family Medicine

## 2022-11-10 VITALS — BP 119/75 | HR 63 | Temp 98.2°F | Wt 139.8 lb

## 2022-11-10 DIAGNOSIS — R9431 Abnormal electrocardiogram [ECG] [EKG]: Secondary | ICD-10-CM

## 2022-11-10 DIAGNOSIS — R197 Diarrhea, unspecified: Secondary | ICD-10-CM

## 2022-11-10 DIAGNOSIS — R55 Syncope and collapse: Secondary | ICD-10-CM

## 2022-11-10 LAB — TSH: TSH: 1.94 u[IU]/mL (ref 0.35–5.50)

## 2022-11-10 NOTE — Patient Instructions (Signed)
No follow-ups on file.        Great to see you today.  I have refilled the medication(s) we provide.   If labs were collected, we will inform you of lab results once received either by echart message or telephone call.   - echart message- for normal results that have been seen by the patient already.   - telephone call: abnormal results or if patient has not viewed results in their echart.  

## 2022-11-10 NOTE — Progress Notes (Signed)
Tiffany Bender , 1951/07/03, 72 y.o., female MRN: [id number] Patient Care Team    Relationship Specialty Notifications Start End  Ma Hillock, DO PCP - General Family Medicine  02/17/21     Chief Complaint  Patient presents with   Loss of Consciousness    Twice in church Saturday night     Subjective: Tiffany Bender is a 72 y.o. Pt presents for an OV with concerns of recent syncopal episode that occurred while she was at church Saturday night.  She reports she was kneeling down in church and told her husband she felt very hot and then she passed out.  They laid her down on the bench and she came to during that time.  She states she was passed out for a couple minutes.  She then passed out again when she was laying on the bench for a couple seconds. She reports she did have breakfast that morning.  She denies any chest pain, shortness of breath, dizziness or palpitations. She reports she was more stressed that day.  Her husband was doing a charity event where he was "going over the edge, "by repelling down a wall/building. She has started a new supplement 2 weeks prior called "calm essence." The supplement contains Ashwagandha.  She wonders if the episode could be secondary to the supplement. She was seen in [facility] system that day for evaluation with a normal CMP, CBC and magnesium level.  CT of the head was normal, EKG showed mild changes with a new short PR interval of 102.  Heart rate 73 and QTc was 471. Patient also states when she came to from her syncopal episode she felt cold and shaky.  Glucose was 102 at the hospital and she had eaten that morning. She also noted she is felt like her temperature has been closer to normal recently, however normally she runs a lower in temperature.  She also reports after her third syncopal event in a row that day she felt like she had to have a bowel movement urgently.  By the time she did get to the bathroom she had "explosive  "diarrhea.     07/28/2022    8:38 AM 11/03/2021    1:45 PM 02/17/2021   11:17 AM 05/31/2013   10:07 AM  Depression screen PHQ 2/9  Decreased Interest 0 0 0 2  Down, Depressed, Hopeless 0 1 0 1  PHQ - 2 Score 0 1 0 3  Altered sleeping    1  Tired, decreased energy    3  Change in appetite    3  Feeling bad or failure about yourself     0  Trouble concentrating    1  Moving slowly or fidgety/restless    0  Suicidal thoughts    0  PHQ-9 Score    11    Allergies  Allergen Reactions   Ciprofloxacin Other (See Comments) and Swelling    Pelvic swelling Other reaction(s): Other Swelling genitalia, high fever. Went to ED     Fenofibrate Other (See Comments)   Morphine Rash   Penicillins Other (See Comments)    Head ache. rash Headaches.     Statins Other (See Comments)    Dark brown urine.  Dark unrine Other reaction(s): Other Dark brown urine.  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 She was adopted at age 56. She does drink coffee daily. She has 2 biological children and 2 adopted children.   Past Medical History:  Diagnosis Date   DDD (degenerative disc disease), lumbar    Dyspareunia in female 08/21/2015   Fatty liver    KNEE PAIN, RIGHT 07/20/2006   Qualifier: Diagnosis of  By: Madilyn Fireman MD, Catherine     LIVER FUNCTION TESTS, ABNORMAL 09/28/2007   Qualifier: Diagnosis of  By: Wynetta Emery LPN, Kim     Neck pain    Osteopenia    Other and unspecified ovarian cysts    history   Vaginal atrophy 09/02/2021   Past Surgical History:  Procedure Laterality Date   ARTHROSCOPIC REPAIR ACL     RT  knee   BUNIONECTOMY  08/23/1992   bilateral   CARPAL TUNNEL RELEASE Right    CARPAL TUNNEL RELEASE  08/23/1998   Left    OVARIAN CYST REMOVAL  08/23/1986   left    POSTERIOR LUMBAR LAMINECTOMY L4-S1 AND FUSION WITH INSTRUMENTATION, L4-L5 TRANSFORAMINAL LUMBAR INTERBODY FUSION AND BONE   04/20/2021   TONSILLECTOMY     right   TUBAL LIGATION     VEIN LIGATION AND STRIPPING  08/24/1979   Family History  Adopted: Yes  Problem Relation Age of Onset   Alcohol abuse Father    Allergies as of 11/10/2022       Reactions   Ciprofloxacin Other (See Comments), Swelling   Pelvic swelling Other reaction(s): Other Swelling genitalia, high fever. Went to ED    Fenofibrate Other (See Comments)   Morphine Rash   Penicillins Other (See Comments)   Head ache. rash Headaches.    Statins Other (See Comments)   Dark brown urine.  Dark unrine Other reaction(s): Other Dark brown urine.  Urine got really dark Has tried Crestor and Lipitor    Sulfamethoxazole-trimethoprim Other (See Comments)   Passed out, Eyes Swollen   Tizanidine Hcl Other (See Comments)   Dizzy, weak, throat swelling    Amoxicillin    headache   Dexamethasone Nausea Only   dizzy   Latex Itching, Rash        Medication List        Accurate as of November 10, 2022  1:11 PM. If you have any questions, ask your nurse or doctor.          ascorbic acid 500 MG tablet Commonly known as: VITAMIN C Take 1,000 mg by mouth.   aspirin EC 81 MG tablet Take 81 mg by mouth daily. Swallow whole.   BORON PO Take 6 mg by mouth.   Cranberry 405 MG Caps 870 mg.   hydrocortisone 1 % ointment Apply 1 application topically 2 (two) times daily as needed for itching.   Magnesium 100 MG Caps Take 1 capsule by mouth at bedtime.   Probiotic Tbec Take 250 mg by mouth See admin instructions.   vitamin E 180 MG (400 UNITS) capsule Take 400 Units by mouth daily.        All past medical history, surgical history,  allergies, family history, immunizations andmedications were updated in the EMR today and reviewed under the history and  medication portions of their EMR.     ROS Negative, with the exception of above mentioned in HPI   Objective:  BP 119/75   Pulse 63   Temp 98.2 F (36.8 C)   Wt 139 lb 12.8 oz (63.4 kg)   SpO2 98%   BMI 24.38 kg/m  Body mass index is 24.38 kg/m. Physical Exam Vitals and nursing note reviewed.  Constitutional:      General: She is not in acute distress.    Appearance: Normal appearance. She is not ill-appearing, toxic-appearing or diaphoretic.  HENT:     Head: Normocephalic and atraumatic.  Eyes:     General: No scleral icterus.       Right eye: No discharge.        Left eye: No discharge.     Extraocular Movements: Extraocular movements intact.     Conjunctiva/sclera: Conjunctivae normal.     Pupils: Pupils are equal, round, and reactive to light.  Neck:     Comments: No thyromegaly Cardiovascular:     Rate and Rhythm: Normal rate and regular rhythm.     Heart sounds: No murmur heard. Pulmonary:     Effort: Pulmonary effort is normal. No respiratory distress.     Breath sounds: Normal breath sounds. No wheezing, rhonchi or rales.  Musculoskeletal:     Cervical back: Neck supple. No tenderness.     Right lower leg: No edema.     Left lower leg: No edema.  Skin:    General: Skin is warm.     Findings: No rash.  Neurological:     Mental Status: She is alert and oriented to person, place, and time. Mental status is at baseline.     Motor: No weakness.     Gait: Gait normal.  Psychiatric:        Mood and Affect: Mood normal.        Behavior: Behavior normal.        Thought Content: Thought content normal.        Judgment: Judgment normal.     No results found. No results found. No results found for this or any previous visit (from the past 24 hour(s)).  Assessment/Plan: Tiffany Bender is a 72 y.o. female present for OV for  Syncope,  unspecified syncope type/Shortened PR interval Certain etiology of her syncope.  Could be related to the supplement versus cardiac cause such as an arrhythmia.  She does have a shortened PR interval on ED EKG which is new for her.  We discussed checking her thyroid to ensure it has remained stable and referring her to cardiology for further evaluation for an arrhythmia and she is agreeable to this today. She of course has stopped the supplement. - TSH - Ambulatory referral to Cardiology  Diarrhea, unspecified type Possibly related to meal consumed, supplement, thyroid or other etiology.  It has resolved. - TSH  45 minutes spent for patient encounter on day of encounter, reviewing ED visit, labs, CT results and EKG.  Reviewed expectations re: course of current medical issues. Discussed self-management of symptoms. Outlined signs and symptoms indicating need for more acute intervention. Patient verbalized understanding and all questions were answered. Patient received an After-Visit Summary.    Orders Placed This Encounter  Procedures   TSH   Ambulatory referral to Cardiology   No orders of the defined types were placed in this encounter.  Referral Orders         Ambulatory referral to Cardiology  Note is dictated utilizing voice recognition software. Although note has been proof read prior to signing, occasional typographical errors still can be missed. If any questions arise, please do not hesitate to call for verification.   electronically signed by:  Howard Pouch, DO  [place]

## 2022-11-23 ENCOUNTER — Ambulatory Visit (INDEPENDENT_AMBULATORY_CARE_PROVIDER_SITE_OTHER): Payer: Medicare Other | Admitting: Family Medicine

## 2022-11-23 ENCOUNTER — Encounter: Payer: Self-pay | Admitting: Family Medicine

## 2022-11-23 VITALS — BP 129/80 | HR 67 | Temp 98.3°F | Wt 140.6 lb

## 2022-11-23 DIAGNOSIS — R55 Syncope and collapse: Secondary | ICD-10-CM | POA: Diagnosis not present

## 2022-11-23 DIAGNOSIS — R2681 Unsteadiness on feet: Secondary | ICD-10-CM

## 2022-11-23 DIAGNOSIS — M542 Cervicalgia: Secondary | ICD-10-CM | POA: Diagnosis not present

## 2022-11-23 DIAGNOSIS — R42 Dizziness and giddiness: Secondary | ICD-10-CM

## 2022-11-23 NOTE — Progress Notes (Signed)
Tiffany Bender , 1950/10/24, 72 y.o., female MRN: [id number] Patient Care Team    Relationship Specialty Notifications Start End  Ma Hillock, DO PCP - General Family Medicine  02/17/21     Chief Complaint  Patient presents with   Neck Pain    Left side; since 11/17/22     Subjective: Tiffany Bender is a 72 y.o. patient presents today with continued symptoms of unsteadiness, dizziness feelings of presyncope.  Patient was seen approximately 2 weeks ago after syncopal episode occurred during church.  Patient reports the left side of her neck is uncomfortable with movement and sometimes without movement now.  She is still having continued dizziness and feelings of presyncope, that she is fearful to go too far from the house or to drive.  She has an appointment with cardiology in 2-3 weeks for reevaluation.    Prior note: Pt presents for an OV with concerns of recent syncopal episode that occurred while she was at church Saturday night.  She reports she was kneeling down in church and told her husband she felt very hot and then she passed out.  They laid her down on the bench and she came to during that time.  She states she was passed out for a couple minutes.  She then passed out again when she was laying on the bench for a couple seconds. She reports she did have breakfast that morning.  She denies any chest pain, shortness of breath, dizziness or palpitations. She reports she was more stressed that day.  Her husband was doing a charity event where he was "going over the edge, "by repelling down a wall/building. She has started a new supplement 2 weeks prior called "calm essence." The supplement contains Ashwagandha.  She wonders if the episode could be secondary to the supplement. She was seen in [facility] system that day for evaluation with a normal CMP, CBC and magnesium level.  CT of the head was normal, EKG showed mild changes with a new short PR interval of 102.  Heart  rate 73 and QTc was 471. Patient also states when she came to from her syncopal episode she felt cold and shaky.  Glucose was 102 at the hospital and she had eaten that morning. She also noted she is felt like her temperature has been closer to normal recently, however normally she runs a lower in temperature.  She also reports after her third syncopal event in a row that day she felt like she had to have a bowel movement urgently.  By the time she did get to the bathroom she had "explosive "diarrhea.     07/28/2022    8:38 AM 11/03/2021    1:45 PM 02/17/2021   11:17 AM 05/31/2013   10:07 AM  Depression screen PHQ 2/9  Decreased Interest 0 0 0 2  Down, Depressed, Hopeless 0 1 0 1  PHQ - 2 Score 0 1 0 3  Altered sleeping    1  Tired, decreased energy    3  Change in appetite    3  Feeling bad or failure about yourself     0  Trouble concentrating    1  Moving slowly or fidgety/restless    0  Suicidal thoughts    0  PHQ-9 Score    11    Allergies  Allergen Reactions   Ciprofloxacin Other (See Comments) and Swelling    Pelvic swelling Other reaction(s): Other  Swelling genitalia, high fever. Went to ED     Fenofibrate Other (See Comments)   Morphine Rash   Penicillins Other (See Comments)    Head ache. rash Headaches.     Statins Other (See Comments)    Dark brown urine.  Dark unrine Other reaction(s): Other Dark brown urine.  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 She was adopted at age 89. She does drink coffee daily. She has 2 biological children and 2 adopted children.   Past Medical History:   Diagnosis Date   DDD (degenerative disc disease), lumbar    Dyspareunia in female 08/21/2015   Fatty liver    KNEE PAIN, RIGHT 07/20/2006   Qualifier: Diagnosis of  By: Madilyn Fireman MD, Catherine     LIVER FUNCTION TESTS, ABNORMAL 09/28/2007   Qualifier: Diagnosis of  By: Wynetta Emery LPN, Kim     Neck pain    Osteopenia    Other and unspecified ovarian cysts    history   Vaginal atrophy 09/02/2021   Past Surgical History:  Procedure Laterality Date   ARTHROSCOPIC REPAIR ACL     RT knee   BUNIONECTOMY  08/23/1992   bilateral   CARPAL TUNNEL RELEASE Right    CARPAL TUNNEL RELEASE  08/23/1998   Left    OVARIAN CYST REMOVAL  08/23/1986   left    POSTERIOR LUMBAR LAMINECTOMY L4-S1 AND FUSION WITH INSTRUMENTATION, L4-L5 TRANSFORAMINAL LUMBAR INTERBODY FUSION AND BONE   04/20/2021   TONSILLECTOMY     right   TUBAL LIGATION     VEIN LIGATION AND STRIPPING  08/24/1979   Family History  Adopted: Yes  Problem Relation Age of Onset   Alcohol abuse Father    Allergies as of 11/23/2022       Reactions   Ciprofloxacin Other (See Comments), Swelling   Pelvic swelling Other reaction(s): Other Swelling genitalia, high fever. Went to ED    Fenofibrate Other (See Comments)   Morphine Rash   Penicillins Other (See Comments)   Head ache. rash Headaches.    Statins Other (See Comments)   Dark brown urine.  Dark unrine Other reaction(s): Other Dark brown urine.  Urine got really dark Has tried Crestor and Lipitor    Sulfamethoxazole-trimethoprim Other (See Comments)   Passed out, Eyes Swollen   Tizanidine Hcl Other (See Comments)   Dizzy, weak, throat swelling    Amoxicillin    headache   Dexamethasone Nausea Only   dizzy   Latex Itching, Rash        Medication List        Accurate as of November 23, 2022  3:17 PM. If you have any questions, ask your nurse or doctor.          ascorbic acid 500 MG tablet Commonly known as: VITAMIN C Take 1,000 mg by mouth.   aspirin EC 81  MG tablet Take 81 mg by mouth daily. Swallow whole.   BORON PO Take 6 mg by mouth.   Cranberry 405 MG Caps 870 mg.   hydrocortisone 1 %  ointment Apply 1 application topically 2 (two) times daily as needed for itching.   Magnesium 100 MG Caps Take 1 capsule by mouth at bedtime.   Probiotic Tbec Take 250 mg by mouth See admin instructions.   vitamin E 180 MG (400 UNITS) capsule Take 400 Units by mouth daily.        All past medical history, surgical history, allergies, family history, immunizations andmedications were updated in the EMR today and reviewed under the history and medication portions of their EMR.     ROS Negative, with the exception of above mentioned in HPI   Objective:  BP 129/80   Pulse 67   Temp 98.3 F (36.8 C)   Wt 140 lb 9.6 oz (63.8 kg)   SpO2 100%   BMI 24.52 kg/m  Body mass index is 24.52 kg/m. Physical Exam Vitals and nursing note reviewed.  Constitutional:      General: She is not in acute distress.    Appearance: Normal appearance. She is normal weight. She is not ill-appearing or toxic-appearing.  HENT:     Head: Normocephalic and atraumatic.  Eyes:     General: No scleral icterus.       Right eye: No discharge.        Left eye: No discharge.     Extraocular Movements: Extraocular movements intact.     Conjunctiva/sclera: Conjunctivae normal.     Pupils: Pupils are equal, round, and reactive to light.  Cardiovascular:     Rate and Rhythm: Normal rate and regular rhythm.  Musculoskeletal:        General: Tenderness present.     Cervical back: Spasms and tenderness present. No bony tenderness. Pain with movement present. Decreased range of motion.     Right lower leg: No edema.     Left lower leg: No edema.  Skin:    Findings: No rash.  Neurological:     Mental Status: She is alert and oriented to person, place, and time. Mental status is at baseline.     Cranial Nerves: No cranial nerve deficit.     Sensory: No sensory  deficit.     Motor: No weakness.     Coordination: Coordination normal.     Gait: Gait normal.  Psychiatric:        Mood and Affect: Mood normal.        Behavior: Behavior normal.        Thought Content: Thought content normal.        Judgment: Judgment normal.     No results found. No results found. No results found for this or any previous visit (from the past 24 hour(s)).  Assessment/Plan: Vergil Gosch is a 72 y.o. female present for OV for  Syncope, unspecified syncope type/Shortened PR interval Uncertain etiology of her syncope.  Could be related to the supplement versus cardiac cause such as an arrhythmia.  She does have a shortened PR interval on ED EKG which is new for her.  - TSH- normal - Ambulatory referral to Cardiology> appt April 23rd.  - MR Brain W Wo Contrast; Future - US Carotid Bilateral; Future Dizziness/unsteady/vertigo - MR Brain W Wo Contrast; Future - US Carotid Bilateral; Future Neck pain on left side Certainly some muscle skeletal component to her neck pain since it is tender to palpation and there is tenderness with movement.  However, there is also a component of her discomfort that is present without any movement or palpation.  Will attempt to  get MRI of the brain and carotid ultrasound completed for her.  - MR Brain W Wo Contrast; Future - US Carotid Bilateral; Future Will refer to neurology if imaging studies warrant need for cardiac evaluation does not show cause and dizziness continues. For muscle skeletal discomfort encouraged heat application.  She does not tolerate many prescribed medications.  Would refrain from any medications that could potentially cause increased dizziness.  Reviewed expectations re: course of current medical issues. Discussed self-management of symptoms. Outlined signs and symptoms indicating need for more acute intervention. Patient verbalized understanding and all questions were answered. Patient received an  After-Visit Summary.    Orders Placed This Encounter  Procedures   MR Brain W Wo Contrast   US Carotid Bilateral   No orders of the defined types were placed in this encounter.  Referral Orders  No referral(s) requested today      Note is dictated utilizing voice recognition software. Although note has been proof read prior to signing, occasional typographical errors still can be missed. If any questions arise, please do not hesitate to call for verification.   electronically signed by:  Howard Pouch, DO  [place]

## 2022-11-23 NOTE — Patient Instructions (Signed)
No follow-ups on file.        Great to see you today.  I have refilled the medication(s) we provide.   If labs were collected, we will inform you of lab results once received either by echart message or telephone call.   - echart message- for normal results that have been seen by the patient already.   - telephone call: abnormal results or if patient has not viewed results in their echart.  

## 2022-11-23 NOTE — Progress Notes (Deleted)
Tiffany Bender , 1950-12-23, 72 y.o., female MRN: [id number] Patient Care Team    Relationship Specialty Notifications Start End  Ma Hillock, DO PCP - General Family Medicine  02/17/21     Chief Complaint  Patient presents with   Neck Pain     Subjective: Tiffany Bender is a 72 y.o. Pt presents for an OV with complaints of *** of *** duration.  Associated symptoms include ***.  Pt has tried *** to ease their symptoms.      07/28/2022    8:38 AM 11/03/2021    1:45 PM 02/17/2021   11:17 AM 05/31/2013   10:07 AM  Depression screen PHQ 2/9  Decreased Interest 0 0 0 2  Down, Depressed, Hopeless 0 1 0 1  PHQ - 2 Score 0 1 0 3  Altered sleeping    1  Tired, decreased energy    3  Change in appetite    3  Feeling bad or failure about yourself     0  Trouble concentrating    1  Moving slowly or fidgety/restless    0  Suicidal thoughts    0  PHQ-9 Score    11    Allergies  Allergen Reactions   Ciprofloxacin Other (See Comments) and Swelling    Pelvic swelling Other reaction(s): Other Swelling genitalia, high fever. Went to ED     Fenofibrate Other (See Comments)   Morphine Rash   Penicillins Other (See Comments)    Head ache. rash Headaches.     Statins Other (See Comments)    Dark brown urine.  Dark unrine Other reaction(s): Other Dark brown urine.  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 She was adopted at age 32. She does drink coffee daily. She has 2 biological children and 2 adopted children.   Past Medical History:  Diagnosis Date   DDD (degenerative disc  disease), lumbar    Dyspareunia in female 08/21/2015   Fatty liver    KNEE PAIN, RIGHT 07/20/2006   Qualifier: Diagnosis of  By: Madilyn Fireman MD, Catherine     LIVER FUNCTION TESTS, ABNORMAL 09/28/2007   Qualifier: Diagnosis of  By: Wynetta Emery LPN, Kim     Neck pain    Osteopenia    Other and unspecified ovarian cysts    history   Vaginal atrophy 09/02/2021   Past Surgical History:  Procedure Laterality Date   ARTHROSCOPIC REPAIR ACL     RT knee   BUNIONECTOMY  08/23/1992   bilateral   CARPAL TUNNEL RELEASE Right    CARPAL TUNNEL RELEASE  08/23/1998   Left    OVARIAN CYST REMOVAL  08/23/1986   left    POSTERIOR LUMBAR LAMINECTOMY L4-S1 AND FUSION WITH INSTRUMENTATION, L4-L5 TRANSFORAMINAL LUMBAR INTERBODY FUSION AND BONE   04/20/2021   TONSILLECTOMY     right   TUBAL LIGATION     VEIN LIGATION AND STRIPPING  08/24/1979   Family History  Adopted: Yes  Problem Relation Age of Onset  Alcohol abuse Father    Allergies as of 11/23/2022       Reactions   Ciprofloxacin Other (See Comments), Swelling   Pelvic swelling Other reaction(s): Other Swelling genitalia, high fever. Went to ED    Fenofibrate Other (See Comments)   Morphine Rash   Penicillins Other (See Comments)   Head ache. rash Headaches.    Statins Other (See Comments)   Dark brown urine.  Dark unrine Other reaction(s): Other Dark brown urine.  Urine got really dark Has tried Crestor and Lipitor    Sulfamethoxazole-trimethoprim Other (See Comments)   Passed out, Eyes Swollen   Tizanidine Hcl Other (See Comments)   Dizzy, weak, throat swelling    Amoxicillin    headache   Dexamethasone Nausea Only   dizzy   Latex Itching, Rash        Medication List        Accurate as of November 23, 2022 10:58 AM. If you have any questions, ask your nurse or doctor.          ascorbic acid 500 MG tablet Commonly known as: VITAMIN C Take 1,000 mg by mouth.   aspirin EC 81 MG tablet Take 81 mg by mouth daily.  Swallow whole.   BORON PO Take 6 mg by mouth.   Cranberry 405 MG Caps 870 mg.   hydrocortisone 1 % ointment Apply 1 application topically 2 (two) times daily as needed for itching.   Magnesium 100 MG Caps Take 1 capsule by mouth at bedtime.   Probiotic Tbec Take 250 mg by mouth See admin instructions.   vitamin E 180 MG (400 UNITS) capsule Take 400 Units by mouth daily.        All past medical history, surgical history, allergies, family history, immunizations andmedications were updated in the EMR today and reviewed under the history and medication portions of their EMR.     ROS Negative, with the exception of above mentioned in HPI   Objective:  There were no vitals taken for this visit. There is no height or weight on file to calculate BMI.  Physical Exam   No results found. No results found. No results found for this or any previous visit (from the past 24 hour(s)).  Assessment/Plan: Tiffany Bender is a 72 y.o. female present for OV for  *** Reviewed expectations re: course of current medical issues. Discussed self-management of symptoms. Outlined signs and symptoms indicating need for more acute intervention. Patient verbalized understanding and all questions were answered. Patient received an After-Visit Summary.    No orders of the defined types were placed in this encounter.  No orders of the defined types were placed in this encounter.  Referral Orders  No referral(s) requested today     Note is dictated utilizing voice recognition software. Although note has been proof read prior to signing, occasional typographical errors still can be missed. If any questions arise, please do not hesitate to call for verification.   electronically signed by:  Howard Pouch, DO  [place]

## 2022-11-25 DIAGNOSIS — R42 Dizziness and giddiness: Secondary | ICD-10-CM | POA: Diagnosis not present

## 2022-11-25 DIAGNOSIS — Z881 Allergy status to other antibiotic agents status: Secondary | ICD-10-CM | POA: Diagnosis not present

## 2022-11-25 DIAGNOSIS — Z86718 Personal history of other venous thrombosis and embolism: Secondary | ICD-10-CM | POA: Diagnosis not present

## 2022-11-25 DIAGNOSIS — Z880 Allergy status to penicillin: Secondary | ICD-10-CM | POA: Diagnosis not present

## 2022-11-25 DIAGNOSIS — Z888 Allergy status to other drugs, medicaments and biological substances status: Secondary | ICD-10-CM | POA: Diagnosis not present

## 2022-11-25 DIAGNOSIS — R55 Syncope and collapse: Secondary | ICD-10-CM | POA: Diagnosis not present

## 2022-11-25 DIAGNOSIS — Z87891 Personal history of nicotine dependence: Secondary | ICD-10-CM | POA: Diagnosis not present

## 2022-11-25 DIAGNOSIS — Z7901 Long term (current) use of anticoagulants: Secondary | ICD-10-CM | POA: Diagnosis not present

## 2022-11-25 DIAGNOSIS — I10 Essential (primary) hypertension: Secondary | ICD-10-CM | POA: Diagnosis not present

## 2022-11-25 DIAGNOSIS — Z885 Allergy status to narcotic agent status: Secondary | ICD-10-CM | POA: Diagnosis not present

## 2022-11-25 DIAGNOSIS — R531 Weakness: Secondary | ICD-10-CM | POA: Diagnosis not present

## 2022-11-25 DIAGNOSIS — Z882 Allergy status to sulfonamides status: Secondary | ICD-10-CM | POA: Diagnosis not present

## 2022-11-25 DIAGNOSIS — M542 Cervicalgia: Secondary | ICD-10-CM | POA: Diagnosis not present

## 2022-11-26 ENCOUNTER — Telehealth: Payer: Self-pay | Admitting: Family Medicine

## 2022-11-26 NOTE — Telephone Encounter (Signed)
Pt reports that she had another "episode" last night and was seen in the ED , but was released after several test that didn't show much. She reports that she almost passed out several time and her BP elevated to 172/95. She refused the 11:40 appt today that I offered and just kept saying she needs to speak with a nurse to discuss this matter and to discuss some appointments she has scheduled for tomorrow based on the ED scheduling. Please contact patient. Once again patient refused 11:40 am appointment.

## 2022-11-26 NOTE — Telephone Encounter (Signed)
Cancel our order.  If she had mra at ED, that is better.

## 2022-11-26 NOTE — Telephone Encounter (Signed)
Spoke with patient regarding results/recommendations.  

## 2022-11-26 NOTE — Telephone Encounter (Signed)
Pt states that she had MRI and MRA done yesterday because she had another episode and did not know if she needed to cancel the MRI and [place]. The ED told her that the MRA is better than the [place]. Pt wanted to know if she should cancel her appt or not to have our imaging study done. She states she will cancel at the time because she needed to know before 10 AM. Please advise.

## 2022-11-27 ENCOUNTER — Ambulatory Visit (HOSPITAL_BASED_OUTPATIENT_CLINIC_OR_DEPARTMENT_OTHER): Payer: Medicare Other

## 2022-12-02 NOTE — Progress Notes (Signed)
Referring-Renee Kuneff DO Reason for referral-syncope  HPI: 72 year old female for evaluation of syncope at request of Felix Pacini DO.  Laboratories April 2024 showed hemoglobin 14.2, potassium 4.1, creatinine 0.72.  Seen March [facility] with [facility] at [facility].  Seen again with near syncope November 25, 2022.  MRA April 2024 showed no significant carotid stenosis.  Electrocardiogram interpreted as normal but I do not have a copy to review.  Cardiology now asked to evaluate.  Patient states on the day of her event she was at church kneeling.  She developed a sudden sensation of feeling hot followed by frank syncope.  There was no preceding chest pain, dyspnea, nausea or palpitations.  She was unconscious for several seconds she believes.  On April 4 she had an episode when she felt dizzy with standing with near syncope but did not have frank syncope.  Cardiology now asked to evaluate.  Otherwise denies dyspnea on exertion, orthopnea, PND, pedal edema, exertional chest pain.  Current Outpatient Medications  Medication Sig Dispense Refill   aspirin EC 81 MG tablet Take 81 mg by mouth daily. Swallow whole.     BORON PO Take 6 mg by mouth.     Cholecalciferol 125 MCG (5000 UT) TABS Take by mouth.     Cranberry 405 MG CAPS 870 mg.     hydrocortisone 1 % ointment Apply 1 application topically 2 (two) times daily as needed for itching. 30 g 1   Probiotic TBEC Take 250 mg by mouth See admin instructions.     No current facility-administered medications for this visit.    Allergies  Allergen Reactions   Ciprofloxacin Other (See Comments) and Swelling    Pelvic swelling Other reaction(s): Other Swelling genitalia, high fever. Went to ED     Fenofibrate Other (See Comments)   Morphine Rash   Penicillins Other (See Comments)    Head ache. rash Headaches.     Statins Other (See Comments)    Dark brown urine.  Dark unrine Other reaction(s): Other Dark brown urine.  Urine got really dark Has  tried Crestor and Lipitor    Sulfamethoxazole-Trimethoprim Other (See Comments)    Passed out, Eyes Swollen   Tizanidine Hcl Other (See Comments)    Dizzy, weak, throat swelling    Amoxicillin     headache   Dexamethasone Nausea Only    dizzy   Latex Itching and Rash     Past Medical History:  Diagnosis Date   DDD (degenerative disc disease), lumbar    Dyspareunia in female 08/21/2015   Fatty liver    Hyperlipidemia    KNEE PAIN, RIGHT 07/20/2006   Qualifier: Diagnosis of  By: Linford Arnold MD, Catherine     LIVER FUNCTION TESTS, ABNORMAL 09/28/2007   Qualifier: Diagnosis of  By: Laural Benes LPN, Kim     Neck pain    Osteopenia    Other and unspecified ovarian cysts    history   Vaginal atrophy 09/02/2021    Past Surgical History:  Procedure Laterality Date   ARTHROSCOPIC REPAIR ACL     RT knee   BUNIONECTOMY  08/23/1992   bilateral   CARPAL TUNNEL RELEASE Right    CARPAL TUNNEL RELEASE  08/23/1998   Left    OVARIAN CYST REMOVAL  08/23/1986   left    POSTERIOR LUMBAR LAMINECTOMY L4-S1 AND FUSION WITH INSTRUMENTATION, L4-L5 TRANSFORAMINAL LUMBAR INTERBODY FUSION AND BONE   04/20/2021   TONSILLECTOMY     right   TUBAL LIGATION  VEIN LIGATION AND STRIPPING  08/24/1979    Social History   Socioeconomic History   Marital status: Married    Spouse name: RV   Number of children: 4   Years of education: Not on file   Highest education level: Not on file  Occupational History   Occupation: Homemaker   Tobacco Use   Smoking status: Former    Types: Cigarettes    Quit date: 08/24/1983    Years since quitting: 39.3   Smokeless tobacco: Never  Vaping Use   Vaping Use: Never used  Substance and Sexual Activity   Alcohol use: Yes    Comment: socially   Drug use: No   Sexual activity: Yes    Partners: Male    Comment: Married; adopted, currently unemployed, college education, 2 children, caffeine 2-3 day, former huwsband abusinve  Other Topics Concern   Not on file   Social History Narrative   No regular exercise. She is a Futures traderhomemaker. She is married to [employer]. She was adopted at age 89. She does drink coffee daily. She has 2 biological children and 2 adopted children.   Social Determinants of Health   Financial Resource Strain: Not on file  Food Insecurity: Not on file  Transportation Needs: Not on file  Physical Activity: Sufficiently Active (11/05/2021)   Exercise Vital Sign    Days of Exercise per Week: 7 days    Minutes of Exercise per Session: 60 min  Stress: Not on file  Social Connections: Moderately Integrated (11/05/2021)   Social Connection and Isolation Panel [NHANES]    Frequency of Communication with Friends and Family: Once a week    Frequency of Social Gatherings with Friends and Family: Once a week    Attends Religious Services: More than 4 times per year    Active Member of [employer] West FinancialClubs or Organizations: No    Attends Engineer, structuralClub or Organization Meetings: 1 to 4 times per year    Marital Status: Married  Catering managerntimate Partner Violence: Not on file    Family History  Adopted: Yes  Problem Relation Age of Onset   Alcohol abuse Father     ROS: no fevers or chills, productive cough, hemoptysis, dysphasia, odynophagia, melena, hematochezia, dysuria, hematuria, rash, seizure activity, orthopnea, PND, pedal edema, claudication. Remaining systems are negative.  Physical Exam:   Blood pressure 110/70, pulse 68, height 5' 3.5" (1.613 m), weight 140 lb 6.4 oz (63.7 kg), SpO2 98 %.  General:  Well developed/well nourished in NAD Skin warm/dry Patient not depressed No peripheral clubbing Back-normal HEENT-normal/normal eyelids Neck supple/normal carotid upstroke bilaterally; no bruits; no JVD; no thyromegaly chest - CTA/ normal expansion CV - RRR/normal S1 and S2; no murmurs, rubs or gallops;  PMI nondisplaced Abdomen -NT/ND, no HSM, no mass, + bowel sounds, no bruit 2+ femoral pulses, no bruits Ext-no edema, chords, 2+ DP Neuro-grossly  nonfocal  ECG -normal sinus rhythm at a rate of 68, no ST changes, normal QT interval.  Personally reviewed  A/P  1 syncope-description sounds to be vagally mediated.  She felt suddenly warm followed by frank syncope.  Will arrange an echocardiogram to assess LV function.  If she has more frequent episodes in the future we will consider a monitor versus implantable loop at that time.  We discussed the importance of maintaining hydration.  I also explained that she should not drive for 6 months following her recent episode.  2 history of hyperlipidemia-she has familiar hyperlipidemia.  She did not tolerate statins or Zetia  in the past.  I will ask for her to be evaluated in the lipid clinic for possible PCSK9 inhibitor.  Olga Millers, MD

## 2022-12-14 ENCOUNTER — Ambulatory Visit: Payer: Medicare Other | Attending: Cardiology | Admitting: Cardiology

## 2022-12-14 ENCOUNTER — Encounter: Payer: Self-pay | Admitting: Cardiology

## 2022-12-14 VITALS — BP 110/70 | HR 68 | Ht 63.5 in | Wt 140.4 lb

## 2022-12-14 DIAGNOSIS — R55 Syncope and collapse: Secondary | ICD-10-CM | POA: Diagnosis not present

## 2022-12-14 DIAGNOSIS — E782 Mixed hyperlipidemia: Secondary | ICD-10-CM | POA: Insufficient documentation

## 2022-12-14 NOTE — Patient Instructions (Signed)
  Testing/Procedures: Your physician has requested that you have an echocardiogram. Echocardiography is a painless test that uses sound waves to create images of your heart. It provides your doctor with information about the size and shape of your heart and how well your heart's chambers and valves are working. This procedure takes approximately one hour. There are no restrictions for this procedure. Please do NOT wear cologne, perfume, aftershave, or lotions (deodorant is allowed). Please arrive 15 minutes prior to your appointment time. [address]   Follow-Up: At [facility], you and your health needs are our priority.  As part of our continuing mission to provide you with exceptional heart care, we have created designated Provider Care Teams.  These Care Teams include your primary Cardiologist (physician) and Advanced Practice Providers (APPs -  Physician Assistants and Nurse Practitioners) who all work together to provide you with the care you need, when you need it.  We recommend signing up for the patient portal called "MyChart".  Sign up information is provided on this After Visit Summary.  MyChart is used to connect with patients for Virtual Visits (Telemedicine).  Patients are able to view lab/test results, encounter notes, upcoming appointments, etc.  Non-urgent messages can be sent to your provider as well.   To learn more about what you can do with MyChart, go to [email or web address].    Your next appointment:   6 month(s)  Provider:   BRIAN CRENSHAW MD    

## 2022-12-28 ENCOUNTER — Telehealth: Payer: Self-pay | Admitting: Family Medicine

## 2022-12-28 NOTE — Telephone Encounter (Signed)
Contacted Tiffany Bender to schedule their annual wellness visit. Patient declined to schedule AWV at this time.  Patient prefers to have her AWV with her PCP, at her office visit 05/02/2023.  Gabriel Cirri [facility] AWV TEAM Direct Dial [phone]

## 2022-12-28 NOTE — Telephone Encounter (Signed)
Spoke with patient regarding results/recommendations. Pt will call back to schedule ov with PCP

## 2022-12-28 NOTE — Telephone Encounter (Signed)
Called patient to schedule AWVS.  Patient requested someone give her a call regarding a question she has about re-starting her Vitamin D dosage.  She also request a text if no answer when calling her back.  Thank you,    Gabriel Cirri [facility] AWV TEAM Direct Dial [phone]

## 2022-12-30 NOTE — Telephone Encounter (Signed)
Pt has declined (twice) to schedule appt to discuss vitamin d  recheck. Pt states she does not understand why she needs to have an appt with PCP, when PCP is the one that lowered her vitamin d. Pt states that her nails are now peeling and she thinks she needs more vitamin d.   Please advise  Pt also declined AWV over the phone and states her CPE is in sept which is her wellness visit with PCP.

## 2023-01-10 ENCOUNTER — Telehealth: Payer: Self-pay | Admitting: Cardiology

## 2023-01-10 NOTE — Telephone Encounter (Signed)
Patient is calling in to ask some questions in regards to the medication. Says that she can not take Staton's so wants to know if appt needs cancelling or not with pharmacist

## 2023-01-10 NOTE — Telephone Encounter (Signed)
Returned call to patient who states that she has several questions regarding medications for cholesterol and would like to know if she should keep appointment with pharmD  Advised patient to write questions down and to bring to appointment with pharmD on Thursday.,   Patient also states she wants to make Dr. Jens Som aware that 130 systolic blood pressure is elevated for her as her BP runs very low. Advised patient that we would not treat blood pressure of 130 due to risk of dropping blood pressure further and causing issues. Patient states she feels like her bp being elevated was the cause of her syncope- just wants to make Dr. Jens Som aware. Will forward message over to him. Patient verbalized understanding.

## 2023-01-13 ENCOUNTER — Ambulatory Visit (INDEPENDENT_AMBULATORY_CARE_PROVIDER_SITE_OTHER): Payer: Medicare Other | Admitting: Pharmacist

## 2023-01-13 ENCOUNTER — Ambulatory Visit (HOSPITAL_COMMUNITY): Payer: Medicare Other | Attending: Cardiology

## 2023-01-13 DIAGNOSIS — R55 Syncope and collapse: Secondary | ICD-10-CM | POA: Diagnosis not present

## 2023-01-13 DIAGNOSIS — E782 Mixed hyperlipidemia: Secondary | ICD-10-CM

## 2023-01-13 LAB — ECHOCARDIOGRAM COMPLETE
Area-P 1/2: 3.42 cm2
S' Lateral: 3 cm

## 2023-01-13 NOTE — Assessment & Plan Note (Signed)
Assessment: LDL-C is above goal of <70 Patient intolerant to statins (dark urine) Ezetimibe ineffective We discussed PCSK9i vs Leqvio. Cost wise, Leqvio would be more affordable Had a long discussion on the mechanism, side effects, reason why we want to treat her LDL-C and risk of MI/stroke  Plan: Will submit Leqvio form to service center. Pt has signed Refer to [employer] center

## 2023-01-13 NOTE — Patient Instructions (Signed)
I will submit your information for the service center

## 2023-01-13 NOTE — Progress Notes (Signed)
Patient ID: Tiffany Bender                 DOB: 28-Jan-1951                    MRN: [id number]      HPI: Tiffany Bender is a 72 y.o. female patient referred to lipid clinic by Dr. Jens Som. PMH is significant for HDL, DVT, IBS and syncope. Has been intolerant to statins and ezetimibe. Referred to lipid clinic to discuss other medication options.   Patient presents today to clinic. She has a list of questions. Very concerned about her recent syncope episodes. Has weird reactions to medications. Wants to know if she really needs medication if they told her all her scans were good. Advised that those scans were of her brain and just told us that she didn't have a stroke. Discussed FH, how diet and lifestyle make little impact on the #'s. Not her fault but should be treated to decrease her risk of MI/Stroke. Discussed how ASCVD is a time to exposure disease.   She has a medicare supplement.  Reviewed options for lowering LDL cholesterol, including ezetimibe, PCSK-9 inhibitors, bempedoic acid and inclisiran.  Discussed mechanisms of action, dosing, side effects and potential decreases in LDL cholesterol.  Also reviewed cost information and potential options for patient assistance.   Current Medications: none Intolerances: rosuvastatin, atorvastatin (dark brown urine), ezetimibe (ineffective), fenofibrate (ineffective) Risk Factors: LDL-C >190 LDL-C goal: <70  Diet: no beef, lots of fruits and vegetables  Exercise:   Family History: does not know family hx bc she was adopted  Social History:  Social History   Socioeconomic History   Marital status: Married    Spouse name: RV   Number of children: 4   Years of education: Not on file   Highest education level: Not on file  Occupational History   Occupation: Homemaker   Tobacco Use   Smoking status: Former    Types: Cigarettes    Quit date: 08/24/1983    Years since quitting: 39.4   Smokeless tobacco: Never  Vaping Use    Vaping Use: Never used  Substance and Sexual Activity   Alcohol use: Yes    Comment: socially   Drug use: No   Sexual activity: Yes    Partners: Male    Comment: Married; adopted, currently unemployed, college education, 2 children, caffeine 2-3 day, former huwsband abusinve  Other Topics Concern   Not on file  Social History Narrative   No regular exercise. 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 She was adopted at age 61. She does drink coffee daily. She has 2 biological children and 2 adopted children.   Social Determinants of Health   Financial Resource Strain: Not on file  Food Insecurity: Not on file  Transportation Needs: Not on file  Physical Activity: Sufficiently Active (11/05/2021)   Exercise Vital Sign    Days of Exercise per Week: 7 days    Minutes of Exercise per Session: 60 min  Stress: Not on file  Social Connections: Moderately Integrated (11/05/2021)   Social Connection and Isolation Panel [NHANES]    Frequency of Communication with Friends and Family: Once a week    Frequency of Social Gatherings with Friends and Family: Once a week    Attends Religious Services: More than 4 times per year    Active Member of [employer] or Organizations: No    Attends Banker  Meetings: 1 to 4 times per year    Marital Status: Married  Catering manager Violence: Not on file     Labs: Lipid Panel     Component Value Date/Time   CHOL 269 (H) 04/28/2022 0905   TRIG 167.0 (H) 04/28/2022 0905   HDL 69.60 04/28/2022 0905   CHOLHDL 4 04/28/2022 0905   VLDL 33.4 04/28/2022 0905   LDLCALC 166 (H) 04/28/2022 0905    Past Medical History:  Diagnosis Date   DDD (degenerative disc disease), lumbar    Dyspareunia in female 08/21/2015   Fatty liver    Hyperlipidemia    KNEE PAIN, RIGHT 07/20/2006   Qualifier: Diagnosis of  By: Linford Arnold MD, Catherine     LIVER FUNCTION TESTS, ABNORMAL 09/28/2007   Qualifier: Diagnosis of  By: Laural Benes LPN, Kim     Neck  pain    Osteopenia    Other and unspecified ovarian cysts    history   Vaginal atrophy 09/02/2021    Current Outpatient Medications on File Prior to Visit  Medication Sig Dispense Refill   aspirin EC 81 MG tablet Take 81 mg by mouth daily. Swallow whole.     BORON PO Take 6 mg by mouth.     Cholecalciferol 125 MCG (5000 UT) TABS Take by mouth.     Cranberry 405 MG CAPS 870 mg.     hydrocortisone 1 % ointment Apply 1 application topically 2 (two) times daily as needed for itching. 30 g 1   Probiotic TBEC Take 250 mg by mouth See admin instructions.     No current facility-administered medications on file prior to visit.    Allergies  Allergen Reactions   Ciprofloxacin Other (See Comments) and Swelling    Pelvic swelling Other reaction(s): Other Swelling genitalia, high fever. Went to ED     Fenofibrate Other (See Comments)   Morphine Rash   Penicillins Other (See Comments)    Head ache. rash Headaches.     Statins Other (See Comments)    Dark brown urine.  Dark unrine Other reaction(s): Other Dark brown urine.  Urine got really dark Has tried Crestor and Lipitor    Sulfamethoxazole-Trimethoprim Other (See Comments)    Passed out, Eyes Swollen   Tizanidine Hcl Other (See Comments)    Dizzy, weak, throat swelling    Amoxicillin     headache   Dexamethasone Nausea Only    dizzy   Latex Itching and Rash    Assessment/Plan:  1. Hyperlipidemia -  Hyperlipidemia Assessment: LDL-C is above goal of <70 Patient intolerant to statins (dark urine) Ezetimibe ineffective We discussed PCSK9i vs Leqvio. Cost wise, Leqvio would be more affordable Had a long discussion on the mechanism, side effects, reason why we want to treat her LDL-C and risk of MI/stroke  Plan: Will submit Leqvio form to service center. Pt has signed Refer to [employer],  Olene Floss, Pharm.D, BCPS, CPP [facility] [address], [place], [place] [address]  Phone: [phone]; Fax: [phone]

## 2023-01-14 ENCOUNTER — Other Ambulatory Visit: Payer: Self-pay | Admitting: Pharmacist

## 2023-01-14 ENCOUNTER — Ambulatory Visit: Payer: Medicare Other

## 2023-01-14 NOTE — Progress Notes (Signed)
Start form has been submitted

## 2023-01-18 ENCOUNTER — Telehealth: Payer: Self-pay | Admitting: Cardiology

## 2023-01-18 NOTE — Telephone Encounter (Signed)
Spoke with patient. Reviewed her scans including MRA, CAC from 2019, labs. Patient has been doing her own research and does not want to pursue any medication at this time. I will cancel request with service center and Infusion center.

## 2023-01-18 NOTE — Telephone Encounter (Signed)
Patient is calling to speak with the pharmacist, Melissa, in regards to injections that they had discussed previously. Please advise.

## 2023-01-20 ENCOUNTER — Ambulatory Visit
Admission: RE | Admit: 2023-01-20 | Discharge: 2023-01-20 | Disposition: A | Discharge status: Home / Self Care | Payer: Medicare Other | Source: Ambulatory Visit | Attending: Family Medicine | Admitting: Family Medicine

## 2023-01-20 DIAGNOSIS — Z1231 Encounter for screening mammogram for malignant neoplasm of breast: Secondary | ICD-10-CM

## 2023-01-24 ENCOUNTER — Telehealth: Payer: Self-pay | Admitting: Family Medicine

## 2023-01-24 NOTE — Telephone Encounter (Signed)
Patient returned call regarding mammogram results. Patient aware mammogram was normal per Dr. Claiborne Billings.  Patient does not have any questions or concerns at this time.

## 2023-01-24 NOTE — Telephone Encounter (Signed)
Please inform patient her mammogram is normal.  

## 2023-01-24 NOTE — Telephone Encounter (Signed)
LM for pt to return call to discuss.  

## 2023-01-25 NOTE — Telephone Encounter (Signed)
noted 

## 2023-02-28 DIAGNOSIS — H40013 Open angle with borderline findings, low risk, bilateral: Secondary | ICD-10-CM | POA: Diagnosis not present

## 2023-04-28 DIAGNOSIS — M79604 Pain in right leg: Secondary | ICD-10-CM | POA: Diagnosis not present

## 2023-04-28 DIAGNOSIS — M7989 Other specified soft tissue disorders: Secondary | ICD-10-CM | POA: Diagnosis not present

## 2023-05-02 ENCOUNTER — Encounter: Payer: Self-pay | Admitting: Family Medicine

## 2023-05-02 ENCOUNTER — Ambulatory Visit (INDEPENDENT_AMBULATORY_CARE_PROVIDER_SITE_OTHER): Payer: Medicare Other | Admitting: Family Medicine

## 2023-05-02 ENCOUNTER — Telehealth: Payer: Self-pay

## 2023-05-02 VITALS — BP 125/77 | HR 61 | Temp 97.5°F | Ht 63.0 in | Wt 136.8 lb

## 2023-05-02 DIAGNOSIS — L989 Disorder of the skin and subcutaneous tissue, unspecified: Secondary | ICD-10-CM

## 2023-05-02 DIAGNOSIS — Z0000 Encounter for general adult medical examination without abnormal findings: Secondary | ICD-10-CM | POA: Diagnosis not present

## 2023-05-02 DIAGNOSIS — E673 Hypervitaminosis D: Secondary | ICD-10-CM

## 2023-05-02 DIAGNOSIS — Z789 Other specified health status: Secondary | ICD-10-CM | POA: Diagnosis not present

## 2023-05-02 DIAGNOSIS — I82562 Chronic embolism and thrombosis of left calf muscular vein: Secondary | ICD-10-CM

## 2023-05-02 DIAGNOSIS — E559 Vitamin D deficiency, unspecified: Secondary | ICD-10-CM | POA: Diagnosis not present

## 2023-05-02 DIAGNOSIS — Z1231 Encounter for screening mammogram for malignant neoplasm of breast: Secondary | ICD-10-CM

## 2023-05-02 DIAGNOSIS — E782 Mixed hyperlipidemia: Secondary | ICD-10-CM | POA: Diagnosis not present

## 2023-05-02 DIAGNOSIS — M85852 Other specified disorders of bone density and structure, left thigh: Secondary | ICD-10-CM

## 2023-05-02 LAB — CBC
HCT: 42.8 % (ref 36.0–46.0)
Hemoglobin: 13.8 g/dL (ref 12.0–15.0)
MCHC: 32.2 g/dL (ref 30.0–36.0)
MCV: 93.1 fl (ref 78.0–100.0)
Platelets: 280 10*3/uL (ref 150.0–400.0)
RBC: 4.6 Mil/uL (ref 3.87–5.11)
RDW: 13.1 % (ref 11.5–15.5)
WBC: 5.3 10*3/uL (ref 4.0–10.5)

## 2023-05-02 LAB — LIPID PANEL
Cholesterol: 258 mg/dL — ABNORMAL HIGH (ref 0–200)
HDL: 67.9 mg/dL (ref >39.00)
LDL Cholesterol: 156 mg/dL — ABNORMAL HIGH (ref 0–99)
NonHDL: 189.74
Total CHOL/HDL Ratio: 4
Triglycerides: 170 mg/dL — ABNORMAL HIGH (ref 0.0–149.0)
VLDL: 34 mg/dL (ref 0.0–40.0)

## 2023-05-02 LAB — COMPREHENSIVE METABOLIC PANEL
ALT: 24 U/L (ref 0–35)
AST: 19 U/L (ref 0–37)
Albumin: 4.3 g/dL (ref 3.5–5.2)
Alkaline Phosphatase: 49 U/L (ref 39–117)
BUN: 11 mg/dL (ref 6–23)
CO2: 30 meq/L (ref 19–32)
Calcium: 9.7 mg/dL (ref 8.4–10.5)
Chloride: 103 meq/L (ref 96–112)
Creatinine, Ser: 0.73 mg/dL (ref 0.40–1.20)
GFR: 83.37 mL/min (ref >60.00)
Glucose, Bld: 82 mg/dL (ref 70–99)
Potassium: 4.5 meq/L (ref 3.5–5.1)
Sodium: 141 meq/L (ref 135–145)
Total Bilirubin: 0.7 mg/dL (ref 0.2–1.2)
Total Protein: 6.5 g/dL (ref 6.0–8.3)

## 2023-05-02 LAB — VITAMIN D 25 HYDROXY (VIT D DEFICIENCY, FRACTURES): VITD: 58.07 ng/mL (ref 30.00–100.00)

## 2023-05-02 LAB — TSH: TSH: 2.17 u[IU]/mL (ref 0.35–5.50)

## 2023-05-02 NOTE — Patient Instructions (Addendum)
Return in about 1 year (around 05/02/2024) for Routine chronic condition follow-up.        Great to see you today.  I have refilled the medication(s) we provide.   If labs were collected or images ordered, we will inform you of  results once we have received them and reviewed. We will contact you either by echart message, or telephone call.  Please give ample time to the testing facility, and our office to run,  receive and review results. Please do not call inquiring of results, even if you can see them in your chart. We will contact you as soon as we are able. If it has been over 1 week since the test was completed, and you have not yet heard from [place], then please call us.    - echart message- for normal results that have been seen by the patient already.   - telephone call: abnormal results or if patient has not viewed results in their echart.  If a referral to a specialist was entered for you, please call us in 2 weeks if you have not heard from the specialist office to schedule.

## 2023-05-02 NOTE — Progress Notes (Signed)
Patient Care Team    Relationship Specialty Notifications Start End  Natalia Leatherwood, DO PCP - General Family Medicine  02/17/21     Chief Complaint  Patient presents with   Follow-up    Fasting RCI. She has been having swelling in her right knee and wants to see if she may need a referral. Declined flu shot    History of Present Ilness: Tiffany Bender, 72 y.o. , female presents today for [facility]    Health maintenance:  Colonoscopy: completed 2016-10 yr. [facility]-Dr. Allyson Sabal. Had been with Dr. Leone Payor in the past.  Mammogram: completed 01/20/2023-[facility], ordered for 2025 Immunizations: tdap declined, Influenza declined (encouraged yearly), PNA series completed, Shingrix declined DEXA: last completed 12/2021 (-1.4-osteopenia)  When feeling dizzy- BP > 130s and once 140s.   Skin lesion She reports she has a skin lesion that is nonhealing behind her right ear.  She feels it is getting larger.  Knee pain: Patient reports she has continued knee pain along her right medial.  She has an appointment scheduled with Ortho this coming Thursday.  Past medical, surgical, family and social histories reviewed (including experiences with illnesses, hospital stays, operations, injuries, and treatments):  Past Medical History:  Diagnosis Date   DDD (degenerative disc disease), lumbar    Dyspareunia in female 08/21/2015   Fatty liver    Hyperlipidemia    KNEE PAIN, RIGHT 07/20/2006   Qualifier: Diagnosis of  By: Linford Arnold MD, Catherine     LIVER FUNCTION TESTS, ABNORMAL 09/28/2007   Qualifier: Diagnosis of  By: Laural Benes LPN, Kim     Neck pain    Osteopenia    Other and unspecified ovarian cysts    history   Vaginal atrophy 09/02/2021   All allergies reviewed Allergies  Allergen Reactions   Ciprofloxacin Other (See Comments) and Swelling    Pelvic swelling Other reaction(s): Other Swelling genitalia, high fever. Went to ED     Fenofibrate Other (See Comments)   Morphine Rash    Penicillins Other (See Comments)    Head ache. rash Headaches.     Statins Other (See Comments)    Dark brown urine.  Dark unrine Other reaction(s): Other Dark brown urine.  Urine got really dark Has tried Crestor and Lipitor    Sulfamethoxazole-Trimethoprim Other (See Comments)    Passed out, Eyes Swollen   Tizanidine Hcl Other (See Comments)    Dizzy, weak, throat swelling    Amoxicillin     headache   Dexamethasone Nausea Only    dizzy   Latex Itching and Rash   Past Surgical History:  Procedure Laterality Date   ARTHROSCOPIC REPAIR ACL     RT knee   BUNIONECTOMY  08/23/1992   bilateral   CARPAL TUNNEL RELEASE Right    CARPAL TUNNEL RELEASE  08/23/1998   Left    OVARIAN CYST REMOVAL  08/23/1986   left    POSTERIOR LUMBAR LAMINECTOMY L4-S1 AND FUSION WITH INSTRUMENTATION, L4-L5 TRANSFORAMINAL LUMBAR INTERBODY FUSION AND BONE   04/20/2021   TONSILLECTOMY     right   TUBAL LIGATION     VEIN LIGATION AND STRIPPING  08/24/1979   Family History  Adopted: Yes  Problem Relation Age of Onset   Alcohol abuse Father    Social History   Social History Narrative   No regular exercise. 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 She was adopted at age 81. She does drink coffee daily. She has 2  biological children and 2 adopted children.    All medications verified Allergies as of 05/02/2023       Reactions   Ciprofloxacin Other (See Comments), Swelling   Pelvic swelling Other reaction(s): Other Swelling genitalia, high fever. Went to ED    Fenofibrate Other (See Comments)   Morphine Rash   Penicillins Other (See Comments)   Head ache. rash Headaches.    Statins Other (See Comments)   Dark brown urine.  Dark unrine Other reaction(s): Other Dark brown urine.  Urine got really dark Has tried Crestor and Lipitor    Sulfamethoxazole-trimethoprim Other (See Comments)   Passed out, Eyes Swollen   Tizanidine Hcl Other (See Comments)   Dizzy, weak, throat  swelling    Amoxicillin    headache   Dexamethasone Nausea Only   dizzy   Latex Itching, Rash        Medication List        Accurate as of May 02, 2023 11:59 PM. If you have any questions, ask your nurse or doctor.          aspirin EC 81 MG tablet Take 81 mg by mouth daily. Swallow whole.   BORON PO Take 6 mg by mouth.   Cholecalciferol 125 MCG (5000 UT) Tabs Take by mouth.   Cranberry 405 MG Caps 870 mg.   hydrocortisone 1 % ointment Apply 1 application topically 2 (two) times daily as needed for itching.   Probiotic Tbec Take 250 mg by mouth See admin instructions.        Depression Screen    05/02/2023    8:35 AM 07/28/2022    8:38 AM 11/03/2021    1:45 PM 02/17/2021   11:17 AM 05/31/2013   10:07 AM  Depression screen PHQ 2/9  Decreased Interest 0 0 0 0 2  Down, Depressed, Hopeless 0 0 1 0 1  PHQ - 2 Score 0 0 1 0 3  Altered sleeping 0    1  Tired, decreased energy 0    3  Change in appetite 0    3  Feeling bad or failure about yourself  0    0  Trouble concentrating 0    1  Moving slowly or fidgety/restless 0    0  Suicidal thoughts 0    0  PHQ-9 Score 0    11  Difficult doing work/chores Not difficult at all          05/02/2023    8:35 AM  GAD 7 : Generalized Anxiety Score  Nervous, Anxious, on Edge 0  Control/stop worrying 0  Worry too much - different things 0  Trouble relaxing 0  Restless 0  Easily annoyed or irritable 0  Afraid - awful might happen 0  Total GAD 7 Score 0  Anxiety Difficulty Not difficult at all     Immunizations: Immunization History  Administered Date(s) Administered   Influenza Split 07/29/2011, 08/11/2012   Influenza Whole 06/23/2005, 06/26/2007   Influenza-Unspecified 06/08/2013   PNEUMOCOCCAL CONJUGATE-20 04/28/2022   Td 06/23/2004   Tdap 05/19/2012          05/02/2023    8:34 AM 07/28/2022    8:38 AM 11/03/2021    1:45 PM 02/17/2021   11:18 AM  Fall Risk   Falls in the past year? 0 0 0 0   Number falls in past yr: 0 0 0 0  Injury with Fall? 0 0 0 0  Risk for fall due to : No Fall  Risks No Fall Risks No Fall Risks   Follow up Falls evaluation completed Falls evaluation completed Falls evaluation completed Falls evaluation completed   Physical Activity: Sufficiently Active (05/02/2023)   Exercise Vital Sign    Days of Exercise per Week: 5 days    Minutes of Exercise per Session: 30 min    Social Connections: Moderately Integrated (05/04/2023)   Social Connection and Isolation Panel [NHANES]    Frequency of Communication with Friends and Family: Once a week    Frequency of Social Gatherings with Friends and Family: Once a week    Attends Religious Services: More than 4 times per year    Active Member of [employer] or Organizations: Yes    Attends Engineer, structural: More than 4 times per year    Marital Status: Married   Social Determinants of Health with Concerns   Tobacco Use: Medium Risk (05/02/2023)   Patient History    Smoking Tobacco Use: Former    Smokeless Tobacco Use: Never    Passive Exposure: Not on file  Food Insecurity: Food Insecurity Present (05/02/2023)   Hunger Vital Sign    Worried About Programme researcher, broadcasting/film/video in the Last Year: Sometimes true    Ran Out of Food in the Last Year: Never true    ROS  Objective  BP 125/77   Pulse 61   Temp (!) 97.5 F (36.4 C) (Oral)   Ht 5\' 3"  (1.6 m)   Wt 136 lb 12.8 oz (62.1 kg)   SpO2 100%   BMI 24.23 kg/m  Physical Exam Vitals and nursing note reviewed.  Constitutional:      General: She is not in acute distress.    Appearance: Normal appearance. She is not ill-appearing or toxic-appearing.  HENT:     Head: Normocephalic and atraumatic.     Right Ear: Tympanic membrane, ear canal and external ear normal. There is no impacted cerumen.     Left Ear: Tympanic membrane, ear canal and external ear normal. There is no impacted cerumen.     Nose: No congestion or rhinorrhea.     Mouth/Throat:     Mouth:  Mucous membranes are moist.     Pharynx: Oropharynx is clear. No oropharyngeal exudate or posterior oropharyngeal erythema.  Eyes:     General: No scleral icterus.       Right eye: No discharge.        Left eye: No discharge.     Extraocular Movements: Extraocular movements intact.     Conjunctiva/sclera: Conjunctivae normal.     Pupils: Pupils are equal, round, and reactive to light.  Cardiovascular:     Rate and Rhythm: Normal rate and regular rhythm.     Pulses: Normal pulses.     Heart sounds: Normal heart sounds. No murmur heard.    No friction rub. No gallop.  Pulmonary:     Effort: Pulmonary effort is normal. No respiratory distress.     Breath sounds: Normal breath sounds. No stridor. No wheezing, rhonchi or rales.  Chest:     Chest wall: No tenderness.  Abdominal:     General: Abdomen is flat. Bowel sounds are normal. There is no distension.     Palpations: Abdomen is soft. There is no mass.     Tenderness: There is no abdominal tenderness. There is no right CVA tenderness, left CVA tenderness, guarding or rebound.     Hernia: No hernia is present.  Musculoskeletal:  General: No swelling, tenderness or deformity. Normal range of motion.     Cervical back: Normal range of motion and neck supple. No rigidity or tenderness.     Right lower leg: No edema.     Left lower leg: No edema.  Lymphadenopathy:     Cervical: No cervical adenopathy.  Skin:    General: Skin is warm and dry.     Coloration: Skin is not jaundiced or pale.     Findings: Lesion (~22mm raised rough round postauricular skin lesion) present. No bruising, erythema or rash.  Neurological:     General: No focal deficit present.     Mental Status: She is alert and oriented to person, place, and time. Mental status is at baseline.     Cranial Nerves: No cranial nerve deficit.     Sensory: No sensory deficit.     Motor: No weakness.     Coordination: Coordination normal.     Gait: Gait normal.     Deep  Tendon Reflexes: Reflexes normal.  Psychiatric:        Mood and Affect: Mood normal.        Behavior: Behavior normal.        Thought Content: Thought content normal.        Judgment: Judgment normal.     No results found.  Assessment and Plan: Tiffany Bender is a 72 y.o. 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 Declines injection type medications. Has been seen by cardiology for recommendations. - Comp Met (CMET) - CBC - Lipid panel - TSH  Osteopenia of neck of left femur/vitamin D DEXA is up-to-date, every 2 years - Vitamin D (25 hydroxy)  Hypervitaminosis D Patient had been vitamin D deficient, she had been over supplementing her vitamin D and levels were greater than 100 and toxic range. - Vitamin D (25 hydroxy)  Breast cancer screening by mammogram - MM 3D SCREENING MAMMOGRAM BILATERAL BREAST; Future  Skin lesion - Ambulatory referral to Dermatology  Return in about 1 year (around 05/02/2024) for Routine chronic condition follow-up.   Modifier 25 added today for Medicare wellness completed by health coach     Electronically Signed by: Felix Pacini, DO [facility] [place]

## 2023-05-02 NOTE — Progress Notes (Deleted)
Patient ID: Tiffany Bender, female  DOB: [birth year], 72 y.o.   MRN: [id number] Patient Care Team    Relationship Specialty Notifications Start End  Natalia Leatherwood, DO PCP - General Family Medicine  02/17/21     No chief complaint on file.   Subjective: Tiffany Bender is a 72 y.o.  Female  present for [facility] All past medical history, surgical history, allergies, family history, immunizations, medications and social history were updated in the electronic medical record today. All recent labs, ED visits and hospitalizations within the last year were reviewed.     07/28/2022    8:38 AM 11/03/2021    1:45 PM 02/17/2021   11:17 AM 05/31/2013   10:07 AM  Depression screen PHQ 2/9  Decreased Interest 0 0 0 2  Down, Depressed, Hopeless 0 1 0 1  PHQ - 2 Score 0 1 0 3  Altered sleeping    1  Tired, decreased energy    3  Change in appetite    3  Feeling bad or failure about yourself     0  Trouble concentrating    1  Moving slowly or fidgety/restless    0  Suicidal thoughts    0  PHQ-9 Score    11       No data to display           Immunization History  Administered Date(s) Administered   Influenza Split 07/29/2011, 08/11/2012   Influenza Whole 06/23/2005, 06/26/2007   Influenza-Unspecified 06/08/2013   PNEUMOCOCCAL CONJUGATE-20 04/28/2022   Td 06/23/2004   Tdap 05/19/2012     Past Medical History:  Diagnosis Date   DDD (degenerative disc disease), lumbar    Dyspareunia in female 08/21/2015   Fatty liver    Hyperlipidemia    KNEE PAIN, RIGHT 07/20/2006   Qualifier: Diagnosis of  By: Linford Arnold MD, Catherine     LIVER FUNCTION TESTS, ABNORMAL 09/28/2007   Qualifier: Diagnosis of  By: Laural Benes LPN, Kim     Neck pain    Osteopenia    Other and unspecified ovarian cysts    history   Vaginal atrophy 09/02/2021   Allergies  Allergen Reactions   Ciprofloxacin Other (See Comments) and Swelling    Pelvic swelling Other reaction(s): Other Swelling genitalia,  high fever. Went to ED     Fenofibrate Other (See Comments)   Morphine Rash   Penicillins Other (See Comments)    Head ache. rash Headaches.     Statins Other (See Comments)    Dark brown urine.  Dark unrine Other reaction(s): Other Dark brown urine.  Urine got really dark Has tried Crestor and Lipitor    Sulfamethoxazole-Trimethoprim Other (See Comments)    Passed out, Eyes Swollen   Tizanidine Hcl Other (See Comments)    Dizzy, weak, throat swelling    Amoxicillin     headache   Dexamethasone Nausea Only    dizzy   Latex Itching and Rash   Past Surgical History:  Procedure Laterality Date   ARTHROSCOPIC REPAIR ACL     RT knee   BUNIONECTOMY  08/23/1992   bilateral   CARPAL TUNNEL RELEASE Right    CARPAL TUNNEL RELEASE  08/23/1998   Left    OVARIAN CYST REMOVAL  08/23/1986   left    POSTERIOR LUMBAR LAMINECTOMY L4-S1 AND FUSION WITH INSTRUMENTATION, L4-L5 TRANSFORAMINAL LUMBAR INTERBODY FUSION AND BONE   04/20/2021   TONSILLECTOMY     right   TUBAL  LIGATION     VEIN LIGATION AND STRIPPING  08/24/1979   Family History  Adopted: Yes  Problem Relation Age of Onset   Alcohol abuse Father    Social History   Social History Narrative   No regular exercise. 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 She was adopted at age 85. She does drink coffee daily. She has 2 biological children and 2 adopted children.    Allergies as of 05/02/2023       Reactions   Ciprofloxacin Other (See Comments), Swelling   Pelvic swelling Other reaction(s): Other Swelling genitalia, high fever. Went to ED    Fenofibrate Other (See Comments)   Morphine Rash   Penicillins Other (See Comments)   Head ache. rash Headaches.    Statins Other (See Comments)   Dark brown urine.  Dark unrine Other reaction(s): Other Dark brown urine.  Urine got really dark Has tried Crestor and Lipitor    Sulfamethoxazole-trimethoprim Other (See Comments)   Passed out, Eyes Swollen    Tizanidine Hcl Other (See Comments)   Dizzy, weak, throat swelling    Amoxicillin    headache   Dexamethasone Nausea Only   dizzy   Latex Itching, Rash        Medication List        Accurate as of May 02, 2023  7:37 AM. If you have any questions, ask your nurse or doctor.          aspirin EC 81 MG tablet Take 81 mg by mouth daily. Swallow whole.   BORON PO Take 6 mg by mouth.   Cholecalciferol 125 MCG (5000 UT) Tabs Take by mouth.   Cranberry 405 MG Caps 870 mg.   hydrocortisone 1 % ointment Apply 1 application topically 2 (two) times daily as needed for itching.   Probiotic Tbec Take 250 mg by mouth See admin instructions.        All past medical history, surgical history, allergies, family history, immunizations andmedications were updated in the EMR today and reviewed under the history and medication portions of their EMR.     No results found for this or any previous visit (from the past 2160 hour(s)).  MM 3D SCREEN BREAST BILATERAL Result Date: 01/01/2022  BI-RADS CATEGORY  1: Negative. Electronically Signed   By: Sherron Ales M.D.   On: 01/01/2022 13:22    ROS 14 pt review of systems performed and negative (unless mentioned in an HPI)  Objective: There were no vitals taken for this visit. Physical Exam Vitals and nursing note reviewed.  Constitutional:      General: She is not in acute distress.    Appearance: Normal appearance. She is not ill-appearing or toxic-appearing.  HENT:     Head: Normocephalic and atraumatic.     Right Ear: Tympanic membrane, ear canal and external ear normal. There is no impacted cerumen.     Left Ear: Tympanic membrane, ear canal and external ear normal. There is no impacted cerumen.     Nose: No congestion or rhinorrhea.     Mouth/Throat:     Mouth: Mucous membranes are moist.     Pharynx: Oropharynx is clear. No oropharyngeal exudate or posterior oropharyngeal erythema.  Eyes:     General: No scleral  icterus.       Right eye: No discharge.        Left eye: No discharge.     Extraocular Movements: Extraocular movements intact.     Conjunctiva/sclera:  Conjunctivae normal.     Pupils: Pupils are equal, round, and reactive to light.  Cardiovascular:     Rate and Rhythm: Normal rate and regular rhythm.     Pulses: Normal pulses.     Heart sounds: Normal heart sounds. No murmur heard.    No friction rub. No gallop.  Pulmonary:     Effort: Pulmonary effort is normal. No respiratory distress.     Breath sounds: Normal breath sounds. No stridor. No wheezing, rhonchi or rales.  Chest:     Chest wall: No tenderness.  Abdominal:     General: Abdomen is flat. Bowel sounds are normal. There is no distension.     Palpations: Abdomen is soft. There is no mass.     Tenderness: There is no abdominal tenderness. There is no right CVA tenderness, left CVA tenderness, guarding or rebound.     Hernia: No hernia is present.  Musculoskeletal:        General: No swelling, tenderness or deformity. Normal range of motion.     Cervical back: Normal range of motion and neck supple. No rigidity or tenderness.     Right lower leg: No edema.     Left lower leg: No edema.  Lymphadenopathy:     Cervical: No cervical adenopathy.  Skin:    General: Skin is warm and dry.     Coloration: Skin is not jaundiced or pale.     Findings: No bruising, erythema, lesion or rash.  Neurological:     General: No focal deficit present.     Mental Status: She is alert and oriented to person, place, and time. Mental status is at baseline.     Cranial Nerves: No cranial nerve deficit.     Sensory: No sensory deficit.     Motor: No weakness.     Coordination: Coordination normal.     Gait: Gait normal.     Deep Tendon Reflexes: Reflexes normal.  Psychiatric:        Mood and Affect: Mood normal.        Behavior: Behavior normal.        Thought Content: Thought content normal.        Judgment: Judgment normal.         No results found.  Assessment/plan: Latifah Yoders is a 72 y.o. female present for ***   No follow-ups on file.   No orders of the defined types were placed in this encounter.  No orders of the defined types were placed in this encounter.  Referral Orders  No referral(s) requested today     Electronically signed by: Felix Pacini, DO [facility] [place]

## 2023-05-02 NOTE — Telephone Encounter (Signed)
   Reason for Referral Request:   sore behind ear  Has patient been seen PCP for this complaint? Yes, this morning  No,  please schedule patient for appointment for complaint.  Patient scheduled on:   Yes, please find out following information.   Referral for which specialty:  dermatology   Preferred office/provider:  Patient states she spoke with Dr. Claiborne Billings this morning about sore on her ear.  She told Dr. Claiborne Billings she would call her current dermatologist.  They are unable to get her scheduled for an appt until next year 2025.  Patient would like a referral from Dr. Claiborne Billings to be seen before the end of year 2024.

## 2023-05-02 NOTE — Telephone Encounter (Signed)
Patient was contacted to confirm dermatology office but due to current availability, she is okay with going to another office. She is okay with being referred to [facility], [place].   Please advise.

## 2023-05-03 NOTE — Telephone Encounter (Signed)
Placed order to derm for her.

## 2023-05-04 ENCOUNTER — Telehealth: Payer: Self-pay

## 2023-05-04 NOTE — Telephone Encounter (Signed)
Pt dropped off photocopy of OTC medication that she is taking and states that she will be increasing her vitamin d in the fall and winter. Placed on PCP desk

## 2023-05-04 NOTE — Telephone Encounter (Signed)
Please call patient I will place the photocopy of her medication that she is taking for her cholesterol in her chart.  There is not much research or evidence backing this as and decreasing cholesterol levels.  She can of course try it and see if it works for her.  As far as the vitamin D, I do not recommend she increase her vitamin D even in the winter and fall months.  Her vitamin D is perfect now, it was great last time we checked on this dose.  When she was taking the higher dose prior to that, she was toxic with her vitamin D levels.  Vitamin D is not a medication that more is necessarily better.  A person can become toxic with too much vitamin D supplement. Ultimately this is her decision as well, but is not recommended.

## 2023-05-05 DIAGNOSIS — M175 Other unilateral secondary osteoarthritis of knee: Secondary | ICD-10-CM | POA: Diagnosis not present

## 2023-05-05 NOTE — Telephone Encounter (Addendum)
Spoke with patient regarding results/recommendations. Pt would like to recheck lipids in 3 month

## 2023-05-06 NOTE — Telephone Encounter (Signed)
noted 

## 2023-05-09 DIAGNOSIS — L820 Inflamed seborrheic keratosis: Secondary | ICD-10-CM | POA: Diagnosis not present

## 2023-05-09 NOTE — Telephone Encounter (Signed)
noted 

## 2023-05-09 NOTE — Telephone Encounter (Signed)
Patient called to inform us she was able to get into her dermatologist and no longer needs the referral.

## 2023-05-11 DIAGNOSIS — L821 Other seborrheic keratosis: Secondary | ICD-10-CM | POA: Diagnosis not present

## 2023-05-11 DIAGNOSIS — D225 Melanocytic nevi of trunk: Secondary | ICD-10-CM | POA: Diagnosis not present

## 2023-05-11 DIAGNOSIS — L578 Other skin changes due to chronic exposure to nonionizing radiation: Secondary | ICD-10-CM | POA: Diagnosis not present

## 2023-05-11 DIAGNOSIS — L2089 Other atopic dermatitis: Secondary | ICD-10-CM | POA: Diagnosis not present

## 2023-05-11 DIAGNOSIS — L570 Actinic keratosis: Secondary | ICD-10-CM | POA: Diagnosis not present

## 2023-07-08 ENCOUNTER — Encounter: Payer: Self-pay | Admitting: Family Medicine

## 2023-07-08 ENCOUNTER — Ambulatory Visit (INDEPENDENT_AMBULATORY_CARE_PROVIDER_SITE_OTHER): Payer: Medicare Other | Admitting: Family Medicine

## 2023-07-08 VITALS — BP 134/82 | HR 62 | Temp 98.3°F | Wt 138.2 lb

## 2023-07-08 DIAGNOSIS — E782 Mixed hyperlipidemia: Secondary | ICD-10-CM

## 2023-07-08 LAB — LIPID PANEL
Cholesterol: 246 mg/dL — ABNORMAL HIGH (ref 0–200)
HDL: 54.7 mg/dL (ref >39.00)
LDL Cholesterol: 137 mg/dL — ABNORMAL HIGH (ref 0–99)
NonHDL: 190.83
Total CHOL/HDL Ratio: 4
Triglycerides: 267 mg/dL — ABNORMAL HIGH (ref 0.0–149.0)
VLDL: 53.4 mg/dL — ABNORMAL HIGH (ref 0.0–40.0)

## 2023-07-08 NOTE — Patient Instructions (Addendum)

## 2023-07-08 NOTE — Progress Notes (Signed)
Patient Care Team    Relationship Specialty Notifications Start End  Tiffany Leatherwood, DO PCP - General Family Medicine  02/17/21     Chief Complaint  Patient presents with   Hyperlipidemia    History of Present Ilness: Tiffany Bender, 72 y.o. , female presents today for lipid recheck- fasting All past medical history, surgical history, allergies, family history, immunizations and social history was obtained from the patient today and entered into the electronic medical record.   HLD: Patient has a history of elevated cholesterol.  She has been tried on statins and fenofibrate.  Both of which she had side effects time because statin myopathy.  She has declined trial of Zetia.  She adamantly does not desire to start Repatha or Praluent.  She has been to see cardiology for their recommendations.  She continues to experience vertigo symptoms.  Her blood pressures at the time of her their symptoms have always been 130s-140 systolic with normal diastolic parameter.  She had an episode recently in which occurred when she was laying down in her bed.  In April she had presyncope/syncopal episode with vertigo and was seen at [facility] urgent care.  MRA of brain and neck/carotids was normal, with no significant stenosis of carotids present.  MRI of brain was normal.  Audiology eval was normal.  She is wondering if the vertigo could possibly be caused by a supplement she has been taking to hopefully lower her cholesterol. She states that she would be willing to restart red yeast rice at a higher dose than she had been in the past if needed to help lower cholesterol. The supplement she is taking is: Pure CHOlestepure plus.  She has been taking this for about 8-10 weeks.  It contains Phytosterols, berbevis, and bergamot.   Lab Results  Component Value Date   CHOL 258 (H) 05/02/2023   CHOL 269 (H) 04/28/2022   CHOL 327 (H) 12/27/2012   Lab Results  Component Value Date   HDL 67.90 05/02/2023    HDL 69.60 04/28/2022   HDL 59 12/27/2012   Lab Results  Component Value Date   LDLCALC 156 (H) 05/02/2023   LDLCALC 166 (H) 04/28/2022   LDLCALC 211 (H) 12/27/2012   Lab Results  Component Value Date   TRIG 170.0 (H) 05/02/2023   TRIG 167.0 (H) 04/28/2022   TRIG 286 (H) 12/27/2012   Lab Results  Component Value Date   CHOLHDL 4 05/02/2023   CHOLHDL 4 04/28/2022   CHOLHDL 5.5 12/27/2012   No results found for: "LDLDIRECT"  Past Medical History:  Diagnosis Date   DDD (degenerative disc disease), lumbar    Dyspareunia in female 08/21/2015   Fatty liver    Hyperlipidemia    KNEE PAIN, RIGHT 07/20/2006   Qualifier: Diagnosis of  By: Linford Arnold MD, Catherine     LIVER FUNCTION TESTS, ABNORMAL 09/28/2007   Qualifier: Diagnosis of  By: Laural Benes LPN, Kim     Neck pain    Osteopenia    Other and unspecified ovarian cysts    history   Vaginal atrophy 09/02/2021   All allergies reviewed Allergies  Allergen Reactions   Ciprofloxacin Other (See Comments) and Swelling    Pelvic swelling Other reaction(s): Other Swelling genitalia, high fever. Went to ED     Fenofibrate Other (See Comments)   Morphine Rash   Penicillins Other (See Comments)    Head ache. rash Headaches.     Statins Other (See Comments)  Dark brown urine.  Dark unrine Other reaction(s): Other Dark brown urine.  Urine got really dark Has tried Crestor and Lipitor    Sulfamethoxazole-Trimethoprim Other (See Comments)    Passed out, Eyes Swollen   Tizanidine Hcl Other (See Comments)    Dizzy, weak, throat swelling    Amoxicillin     headache   Dexamethasone Nausea Only    dizzy   Latex Itching and Rash   Past Surgical History:  Procedure Laterality Date   ARTHROSCOPIC REPAIR ACL     RT knee   BUNIONECTOMY  08/23/1992   bilateral   CARPAL TUNNEL RELEASE Right    CARPAL TUNNEL RELEASE  08/23/1998   Left    OVARIAN CYST REMOVAL  08/23/1986   left    POSTERIOR LUMBAR LAMINECTOMY L4-S1 AND  FUSION WITH INSTRUMENTATION, L4-L5 TRANSFORAMINAL LUMBAR INTERBODY FUSION AND BONE   04/20/2021   TONSILLECTOMY     right   TUBAL LIGATION     VEIN LIGATION AND STRIPPING  08/24/1979   Family History  Adopted: Yes  Problem Relation Age of Onset   Alcohol abuse Father    Social History   Social History Narrative   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 She was adopted at age 64. She does drink coffee daily. She has 2 biological children and 2 adopted children.    All medications verified Allergies as of 07/08/2023       Reactions   Ciprofloxacin Other (See Comments), Swelling   Pelvic swelling Other reaction(s): Other Swelling genitalia, high fever. Went to ED    Fenofibrate Other (See Comments)   Morphine Rash   Penicillins Other (See Comments)   Head ache. rash Headaches.    Statins Other (See Comments)   Dark brown urine.  Dark unrine Other reaction(s): Other Dark brown urine.  Urine got really dark Has tried Crestor and Lipitor    Sulfamethoxazole-trimethoprim Other (See Comments)   Passed out, Eyes Swollen   Tizanidine Hcl Other (See Comments)   Dizzy, weak, throat swelling    Amoxicillin    headache   Dexamethasone Nausea Only   dizzy   Latex Itching, Rash        Medication List        Accurate as of July 08, 2023  3:09 PM. If you have any questions, ask your nurse or doctor.          aspirin EC 81 MG tablet Take 81 mg by mouth daily. Swallow whole.   BORON PO Take 6 mg by mouth.   Cholecalciferol 125 MCG (5000 UT) Tabs Take 2,000 Units by mouth.   Cranberry 405 MG Caps 870 mg.   hydrocortisone 1 % ointment Apply 1 application topically 2 (two) times daily as needed for itching.   Probiotic Tbec Take 250 mg by mouth See admin instructions.   triamcinolone cream 0.1 % Commonly known as: KENALOG SMARTSIG:Topical 1-2 Times Daily PRN        Depression Screen    07/08/2023    8:45 AM  05/02/2023    8:35 AM 07/28/2022    8:38 AM 11/03/2021    1:45 PM 02/17/2021   11:17 AM  Depression screen PHQ 2/9  Decreased Interest 0 0 0 0 0  Down, Depressed, Hopeless 0 0 0 1 0  PHQ - 2 Score 0 0 0 1 0  Altered sleeping  0     Tired, decreased energy  0  Change in appetite  0     Feeling bad or failure about yourself   0     Trouble concentrating  0     Moving slowly or fidgety/restless  0     Suicidal thoughts  0     PHQ-9 Score  0     Difficult doing work/chores  Not difficult at all         05/02/2023    8:35 AM  GAD 7 : Generalized Anxiety Score  Nervous, Anxious, on Edge 0  Control/stop worrying 0  Worry too much - different things 0  Trouble relaxing 0  Restless 0  Easily annoyed or irritable 0  Afraid - awful might happen 0  Total GAD 7 Score 0  Anxiety Difficulty Not difficult at all     Immunizations: Immunization History  Administered Date(s) Administered   Influenza Split 07/29/2011, 08/11/2012   Influenza Whole 06/23/2005, 06/26/2007   Influenza-Unspecified 06/08/2013   PNEUMOCOCCAL CONJUGATE-20 04/28/2022   Td 06/23/2004   Tdap 05/19/2012          07/08/2023    8:45 AM 05/02/2023    8:34 AM 07/28/2022    8:38 AM 11/03/2021    1:45 PM 02/17/2021   11:18 AM  Fall Risk   Falls in the past year? 0 0 0 0 0  Number falls in past yr: 0 0 0 0 0  Injury with Fall? 0 0 0 0 0  Risk for fall due to : No Fall Risks No Fall Risks No Fall Risks No Fall Risks   Follow up Falls evaluation completed Falls evaluation completed Falls evaluation completed Falls evaluation completed Falls evaluation completed   Physical Activity: Sufficiently Active (05/02/2023)   Exercise Vital Sign    Days of Exercise per Week: 5 days    Minutes of Exercise per Session: 30 min    Social Connections: Moderately Integrated (05/04/2023)   Social Connection and Isolation Panel [NHANES]    Frequency of Communication with Friends and Family: Once a week    Frequency of Social  Gatherings with Friends and Family: Once a week    Attends Religious Services: More than 4 times per year    Active Member of [employer] or Organizations: Yes    Attends Engineer, structural: More than 4 times per year    Marital Status: Married   Social Determinants of Health with Concerns   Tobacco Use: Medium Risk (07/08/2023)   Patient History    Smoking Tobacco Use: Former    Smokeless Tobacco Use: Never    Passive Exposure: Not on file  Food Insecurity: Food Insecurity Present (05/02/2023)   Hunger Vital Sign    Worried About Programme researcher, broadcasting/film/video in the Last Year: Sometimes true    Ran Out of Food in the Last Year: Never true    ROS  Objective  BP 134/82   Pulse 62   Temp 98.3 F (36.8 C)   Wt 138 lb 3.2 oz (62.7 kg)   SpO2 96%   BMI 24.48 kg/m  Physical Exam Vitals and nursing note reviewed.  Constitutional:      General: She is not in acute distress.    Appearance: Normal appearance. She is normal weight. She is not ill-appearing or toxic-appearing.  HENT:     Head: Normocephalic and atraumatic.  Eyes:     General: No scleral icterus.       Right eye: No discharge.        Left  eye: No discharge.     Extraocular Movements: Extraocular movements intact.     Conjunctiva/sclera: Conjunctivae normal.     Pupils: Pupils are equal, round, and reactive to light.  Cardiovascular:     Rate and Rhythm: Normal rate and regular rhythm.     Heart sounds: No murmur heard. Skin:    Findings: No rash.  Neurological:     Mental Status: She is alert and oriented to person, place, and time. Mental status is at baseline.     Motor: No weakness.     Coordination: Coordination normal.     Gait: Gait normal.  Psychiatric:        Mood and Affect: Mood normal.        Behavior: Behavior normal.        Thought Content: Thought content normal.        Judgment: Judgment normal.     No results found.  Assessment and Plan: Tiffany Bender is a 72 y.o.  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 Has been seen by cardiology for recommendations. Lipids collected today per pt desire  Hypervitaminosis D Patient had been vitamin D deficient, she had been over supplementing her vitamin D and levels were greater than 100 and toxic range. Patient reports she is currently taking 2000 units of vitamin D daily.  Her vitamin D has been normal, in the 50s range on this dose.   She states today that she would like to increase to 5000 units daily as she had been prior.  I reminded patient her vitamin D is in normal range with the vitamin D 2000 units daily supplements she is currently taking.  Increasing the supplement runs the risk of becoming toxic on vitamin D.  She was reminded that vitamin D use one of the few vitamins that can become toxic in the body and more, is not necessarily better.   Return if symptoms worsen or fail to improve.   Electronically Signed by: Felix Pacini, DO [facility]- [place]

## 2023-07-11 ENCOUNTER — Telehealth: Payer: Self-pay | Admitting: Family Medicine

## 2023-07-11 NOTE — Telephone Encounter (Signed)
Please call patient: Her LDL/bad cholesterol levels did improve from 156 prior, now 137.  Since she is considering discontinuing the supplement she was taking due to side effects.  I would recommend her trying to restart the red yeast rice at 600 mg twice daily.

## 2023-07-11 NOTE — Telephone Encounter (Signed)
Called and gave pt results/recommendations.  Pt stated she stopped the supplement mentioned due to diarrhea. She stated she would try to start the medication recommended in the mean time. Pt then had questions about her HDL and triglycerides. I told her that Dr. Claiborne Billings did not give any recommendations regarding any of those levels. She then requested a link to set up MyChart to "see for herself". Erie Noe sent the link to her via phone #.

## 2023-07-18 ENCOUNTER — Telehealth: Payer: Self-pay | Admitting: Family Medicine

## 2023-07-18 NOTE — Telephone Encounter (Signed)
Spoke with patient regarding results/recommendations. Pt refused to be scheduled because she wants to know what test would be done and if she would have test done that GI would do. I advised pt that the best I can do is get her scheduled to discuss with PA what test would be done but she has the decision to have completed. Pt would rather wait to be seen by GI on Monday due to not knowing what test will be completed with our office.

## 2023-07-18 NOTE — Telephone Encounter (Signed)
Patient refused to scheduled appointment during phone call. She is aware that Dr. Claiborne Billings is out of office and Alphonzo Lemmings is the only one with available appointments tomorrow. She only wants to know if she will be able to help her and what test will be ran in regards to her ongoing diarrhea  . The patient is aware she would need to be evaluated to determine treatment. She is asking that a medical assistant giver her a call to discuss.

## 2023-07-19 ENCOUNTER — Telehealth: Payer: Self-pay | Admitting: Gastroenterology

## 2023-07-19 NOTE — Telephone Encounter (Signed)
Patient called to cancel appt on 12/3 due to being seen with another GI.  FYI

## 2023-07-20 DIAGNOSIS — Z13810 Encounter for screening for upper gastrointestinal disorder: Secondary | ICD-10-CM | POA: Diagnosis not present

## 2023-07-20 DIAGNOSIS — R197 Diarrhea, unspecified: Secondary | ICD-10-CM | POA: Diagnosis not present

## 2023-07-21 DIAGNOSIS — R197 Diarrhea, unspecified: Secondary | ICD-10-CM | POA: Diagnosis not present

## 2023-07-25 ENCOUNTER — Ambulatory Visit: Payer: Medicare Other | Admitting: Physician Assistant

## 2023-08-04 DIAGNOSIS — K529 Noninfective gastroenteritis and colitis, unspecified: Secondary | ICD-10-CM | POA: Diagnosis not present

## 2023-08-11 ENCOUNTER — Telehealth: Payer: Self-pay

## 2023-08-11 NOTE — Telephone Encounter (Signed)
Please advise on the pt's most recent labs.

## 2023-08-12 NOTE — Telephone Encounter (Addendum)
Reasoning for message is unclear.  We have not ordered any recent labs on the patient.  Therefore I have no recent results that need to be discussed with her. Last labs by this office were resulted about 4 weeks ago, and there is a result note already present, all of your staff is already discussed those results. She is asking about labs possibly collected by another physician, then she would need to call that office to discuss those labs.

## 2023-08-18 ENCOUNTER — Telehealth: Payer: Self-pay

## 2023-08-18 NOTE — Telephone Encounter (Signed)
Reason for CRM: Patient would like to come into the office to pick up her most recent lab results. Please call patient to advise when is a good time for results to be picked up. Patient's call back number is [phone]. If it can't be picked up today she is more than happy to pick them up on Monday12/23/2024.    CRM from12/20 dnh

## 2023-08-19 NOTE — Telephone Encounter (Signed)
Left pt a vm that labs will be printed and placed up front

## 2023-08-30 DIAGNOSIS — H40013 Open angle with borderline findings, low risk, bilateral: Secondary | ICD-10-CM | POA: Diagnosis not present

## 2023-08-30 DIAGNOSIS — H25813 Combined forms of age-related cataract, bilateral: Secondary | ICD-10-CM | POA: Diagnosis not present

## 2023-08-30 DIAGNOSIS — H04123 Dry eye syndrome of bilateral lacrimal glands: Secondary | ICD-10-CM | POA: Diagnosis not present

## 2023-08-30 DIAGNOSIS — H526 Other disorders of refraction: Secondary | ICD-10-CM | POA: Diagnosis not present

## 2023-08-30 DIAGNOSIS — H02402 Unspecified ptosis of left eyelid: Secondary | ICD-10-CM | POA: Diagnosis not present

## 2023-08-30 DIAGNOSIS — H43813 Vitreous degeneration, bilateral: Secondary | ICD-10-CM | POA: Diagnosis not present

## 2023-10-26 ENCOUNTER — Ambulatory Visit: Payer: Self-pay | Admitting: Family Medicine

## 2023-10-26 NOTE — Telephone Encounter (Signed)
 Chief Complaint: Increased Frequency  Symptoms: Increased frequency, burning, foul odor Frequency: constant x1.5 weeks Pertinent Negatives: Patient denies fever, blood in urine, cloudy urine Disposition: [] ED /[] Urgent Care (no appt availability in office) / [x] Appointment(In office/virtual)/ []  [facility]/ [] Home Care/ [] Refused Recommended Disposition /[] [facility]/ []  Follow-up with PCP Additional Notes: Patient states she has had burning with urination and increased output for about 1.5 weeks. Patient had C.diff in the past and is afraid to take antibiotics unless she absolutely has to take them. Patient states she is 99% sure she has a UTI because this is what it always feels like. Care advice was given and patient has an appointment to transfer care to a new PCP on Friday.   Copied from CRM [id number]. Topic: Clinical - Red Word Triage >> Oct 26, 2023 10:49 AM Nila Nephew wrote: Red Word that prompted transfer to Nurse Triage: Patient has UTI - cannot take most antibiotics and natural remedies are not working. Does NOT want to be seen at current PCP office. Reason for Disposition  Urinating more frequently than usual (i.e., frequency)  Answer Assessment - Initial Assessment Questions 1. SYMPTOM: "What's the main symptom you're concerned about?" (e.g., frequency, incontinence)     Increased frequency  2. ONSET: "When did the  frequency  start?"     1 week and a half ago 3. PAIN: "Is there any pain?" If Yes, ask: "How bad is it?" (Scale: 1-10; mild, moderate, severe)     4/10 4. CAUSE: "What do you think is causing the symptoms?"     UTI 5. OTHER SYMPTOMS: "Do you have any other symptoms?" (e.g., blood in urine, fever, flank pain, pain with urination)     Burning, foul odor  Protocols used: Urinary Symptoms-A-AH

## 2023-10-27 ENCOUNTER — Ambulatory Visit: Payer: Self-pay | Admitting: Family Medicine

## 2023-10-27 NOTE — Telephone Encounter (Signed)
 Copied from CRM [id number]. Topic: Clinical - Medical Advice >> Oct 27, 2023 11:20 AM Higinio Roger wrote: Reason for CRM: Patient has an appointment tomorrow for a UTI on 10/28/23 and would like to know if she should still come in due to having diarrhea. Callback #: [phone]   Chief Complaint: Diarrhea Symptoms: Diarrhea Frequency: This morning Pertinent Negatives: Patient denies fever, vomiting, nausea, or abdominal pain Disposition: [] ED /[] Urgent Care (no appt availability in office) / [x] Appointment(In office/virtual)/ []  [facility]/ [] Home Care/ [] Refused Recommended Disposition /[] [facility]/ []  Follow-up with PCP Additional Notes: MW is being triaged for a new onset of diarrhea and she is inquiring if she should still come into her appointment tomorrow. Based on  telephonic triage assessment, advised patient to continue home care regimen, but look for contact from the office regarding Transfer of Care Appointment tomorrow. Unable to contact [facility] at this time due to Lunch Hours.   Reason for Disposition  MILD-MODERATE diarrhea (e.g., 1-6 times / day more than normal)  Answer Assessment - Initial Assessment Questions 1. DIARRHEA SEVERITY: "How bad is the diarrhea?" "How many more stools have you had in the past 24 hours than normal?"    - NO DIARRHEA (SCALE 0)   - MILD (SCALE 1-3): Few loose or mushy BMs; increase of 1-3 stools over normal daily number of stools; mild increase in ostomy output.   -  MODERATE (SCALE 4-7): Increase of 4-6 stools daily over normal; moderate increase in ostomy output.   -  SEVERE (SCALE 8-10; OR "WORST POSSIBLE"): Increase of 7 or more stools daily over normal; moderate increase in ostomy output; incontinence.     3 2. ONSET: "When did the diarrhea begin?"      This Morning 3. BM CONSISTENCY: "How loose or watery is the diarrhea?"      Both  4. VOMITING: "Are you also vomiting?" If Yes, ask: "How many times in the past 24 hours?"       No  5. ABDOMEN PAIN: "Are you having any abdomen pain?" If Yes, ask: "What does it feel like?" (e.g., crampy, dull, intermittent, constant)      No  6. ABDOMEN PAIN SEVERITY: If present, ask: "How bad is the pain?"  (e.g., Scale 1-10; mild, moderate, or severe)   - MILD (1-3): doesn't interfere with normal activities, abdomen soft and not tender to touch    - MODERATE (4-7): interferes with normal activities or awakens from sleep, abdomen tender to touch    - SEVERE (8-10): excruciating pain, doubled over, unable to do any normal activities       None  7. ORAL INTAKE: If vomiting, "Have you been able to drink liquids?" "How much liquids have you had in the past 24 hours?"     Yes  8. HYDRATION: "Any signs of dehydration?" (e.g., dry mouth [not just dry lips], too weak to stand, dizziness, new weight loss) "When did you last urinate?"     No  9. EXPOSURE: "Have you traveled to a foreign country recently?" "Have you been exposed to anyone with diarrhea?" "Could you have eaten any food that was spoiled?"     No  10. ANTIBIOTIC USE: "Are you taking antibiotics now or have you taken antibiotics in the past 2 months?"       No 11. OTHER SYMPTOMS: "Do you have any other symptoms?" (e.g., fever, blood in stool)       No  12. PREGNANCY: "Is there any chance  you are pregnant?" "When was your last menstrual period?"       No and No  Protocols used: Diarrhea-A-AH

## 2023-10-28 ENCOUNTER — Ambulatory Visit: Admitting: Family Medicine

## 2023-10-28 ENCOUNTER — Encounter: Admitting: Family Medicine

## 2023-10-28 ENCOUNTER — Ambulatory Visit
Admission: RE | Admit: 2023-10-28 | Discharge: 2023-10-28 | Disposition: A | Discharge status: Home / Self Care | Source: Ambulatory Visit | Attending: Family Medicine | Admitting: Family Medicine

## 2023-10-28 VITALS — BP 156/89 | HR 86 | Temp 98.5°F | Resp 19

## 2023-10-28 DIAGNOSIS — R300 Dysuria: Secondary | ICD-10-CM

## 2023-10-28 DIAGNOSIS — N3001 Acute cystitis with hematuria: Secondary | ICD-10-CM

## 2023-10-28 HISTORY — DX: Enterocolitis due to Clostridium difficile, not specified as recurrent: A04.72

## 2023-10-28 LAB — POCT URINALYSIS DIP (MANUAL ENTRY)
Bilirubin, UA: NEGATIVE
Glucose, UA: NEGATIVE mg/dL
Ketones, POC UA: NEGATIVE mg/dL
Nitrite, UA: NEGATIVE
Protein Ur, POC: NEGATIVE mg/dL
Spec Grav, UA: 1.025 (ref 1.010–1.025)
Urobilinogen, UA: NEGATIVE U/dL — AB
pH, UA: 5 (ref 5.0–8.0)

## 2023-10-28 MED ORDER — NITROFURANTOIN MONOHYD MACRO 100 MG PO CAPS: 100.0000 mg | ORAL_CAPSULE | Freq: Two times a day (BID) | ORAL | 0 refills | Status: AC

## 2023-10-28 NOTE — ED Provider Notes (Signed)
 [facility]    CSN: [id number] Arrival date & time: 10/28/23  1429      History   Chief Complaint Chief Complaint  Patient presents with   Dysuria    HPI Tiffany Bender is a 73 y.o. female.   HPI Very pleasant 73 year old female presents with dysuria for 2 week.  Patient reports history of CHF and is concerned with antibiotic use.  PMH significant for HLD, DDD of lumbar spine, and osteopenia  Past Medical History:  Diagnosis Date   C. 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) 02/05/2021   Vitamin D deficiency 10/11/2019   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   Osteopenia 05/24/2014   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       Home Medications    Prior to Admission medications   Medication Sig Start Date End Date Taking? Authorizing Provider  nitrofurantoin, macrocrystal-monohydrate, (MACROBID) 100 MG capsule Take 1 capsule (100 mg total) by mouth 2 (two) times daily for 7 days. 10/28/23 11/04/23 Yes Trevor Iha, FNP  aspirin EC 81 MG tablet Take 81 mg by mouth daily. Swallow whole.    [provider]  BORON PO Take 6 mg by mouth.    [provider]  Cholecalciferol 125 MCG (5000 UT) TABS Take 2,000 Units by mouth. 04/14/21   [provider]  Cranberry 405 MG CAPS 870 mg.    [provider]  hydrocortisone 1 % ointment Apply 1 application topically 2 (two) times daily as needed for itching. 09/10/21   Kuneff, Renee A, DO  Probiotic TBEC Take 250 mg by mouth See admin instructions.    [provider]  triamcinolone cream (KENALOG) 0.1 % SMARTSIG:Topical 1-2 Times Daily PRN 05/11/23   [provider]    Family History Family History  Adopted: Yes  Problem Relation Age of Onset   Alcohol abuse Father     Social History Social History   Tobacco Use   Smoking status: Former    Current packs/day: 0.00    Types: Cigarettes  Quit date: 08/24/1983    Years since quitting: 40.2   Smokeless tobacco: Never  Vaping Use   Vaping status: Never Used  Substance Use Topics   Alcohol use: Yes    Comment: socially   Drug use: No     Allergies   Ciprofloxacin, Fenofibrate, Morphine, Penicillins, Statins, Sulfamethoxazole-trimethoprim, Tizanidine hcl, Amoxicillin, Dexamethasone, and Latex   Review of Systems Review of Systems  Genitourinary:  Positive for frequency.     Physical Exam Triage Vital Signs ED Triage Vitals [10/28/23 1449]  Encounter Vitals Group     BP      Systolic BP Percentile      Diastolic BP Percentile      Pulse      Resp      Temp      Temp src      SpO2      Weight      Height      Head Circumference      Peak Flow       Pain Score 0     Pain Loc      Pain Education      Exclude from Growth Chart    No data found.  Updated Vital Signs BP (!) 156/89   Pulse 86   Temp 98.5 F (36.9 C)   Resp 19   SpO2 98%    Physical Exam Vitals and nursing note reviewed.  Constitutional:      Appearance: Normal appearance. She is normal weight.  HENT:     Head: Normocephalic and atraumatic.     Mouth/Throat:     Mouth: Mucous membranes are moist.     Pharynx: Oropharynx is clear.  Eyes:     Extraocular Movements: Extraocular movements intact.     Conjunctiva/sclera: Conjunctivae normal.     Pupils: Pupils are equal, round, and reactive to light.  Cardiovascular:     Rate and Rhythm: Normal rate and regular rhythm.     Pulses: Normal pulses.     Heart sounds: Normal heart sounds.  Pulmonary:     Effort: Pulmonary effort is normal.     Breath sounds: Normal breath sounds. No wheezing, rhonchi or rales.  Abdominal:     Tenderness: There is no right CVA tenderness or left CVA tenderness.  Musculoskeletal:        General: Normal range of motion.     Cervical back: Normal range of motion and neck supple.  Skin:    General: Skin is warm and dry.  Neurological:     General: No focal deficit present.     Mental Status: She is alert and oriented to person, place, and time. Mental status is at baseline.  Psychiatric:        Mood and Affect: Mood normal.        Behavior: Behavior normal.      UC Treatments / Results  Labs (all labs ordered are listed, but only abnormal results are displayed) Labs Reviewed  POCT URINALYSIS DIP (MANUAL ENTRY) - Abnormal; Notable for the following components:      Result Value   Clarity, UA cloudy (*)    Blood, UA trace-intact (*)    Urobilinogen, UA negative (*)    Leukocytes, UA Large (3+) (*)    All other components within normal limits    EKG   Radiology No results found.  Procedures Procedures (including critical care time)  Medications Ordered in  UC Medications - No data to display  Initial  Impression / Assessment and Plan / UC Course  I have reviewed the triage vital signs and the nursing notes.  Pertinent labs & imaging results that were available during my care of the patient were reviewed by me and considered in my medical decision making (see chart for details).     MDM: 1.  Acute cystitis with hematuria-UA revealed above, urine culture ordered Rx'd Macrobid 100 mg capsule: Take 1 capsule twice daily x 7 days. Advised patient to take medication as directed with food to completion.  2.  Dysuria-as 1.  Rx'd Macrobid 100 mg capsule: Take 1 capsule twice daily x 7 days.  Encouraged increase daily water intake to 64 ounces per day while taking this medication.  Advised we will follow-up with urine culture results once received.  Advised if symptoms worsen and/or unresolved please follow-up with your PCP or [facility] health urology for further evaluation.  Final Clinical Impressions(s) / UC Diagnoses   Final diagnoses:  Dysuria  Acute cystitis with hematuria     Discharge Instructions      Advised patient to take medication as directed with food to completion.  Encouraged increase daily water intake to 64 ounces per day while taking this medication.  Advised we will follow-up with urine culture results once received.  Advised if symptoms worsen and/or unresolved please follow-up with your PCP or [facility] health urology for further evaluation.     ED Prescriptions     Medication Sig Dispense Auth. Provider   nitrofurantoin, macrocrystal-monohydrate, (MACROBID) 100 MG capsule Take 1 capsule (100 mg total) by mouth 2 (two) times daily for 7 days. 14 capsule Trevor Iha, FNP      PDMP not reviewed this encounter.   Trevor Iha, FNP 10/28/23 1542

## 2023-10-28 NOTE — ED Triage Notes (Signed)
 Pt presents to uc with co of  dysuria for 2 weeks.pt has pmh of c. Diff and is very concerned about taking antibiotics. Pt has been using herbal medications with no improvement.

## 2023-10-28 NOTE — Discharge Instructions (Addendum)
 Advised patient to take medication as directed with food to completion.  Encouraged increase daily water intake to 64 ounces per day while taking this medication.  Advised we will follow-up with urine culture results once received.  Advised if symptoms worsen and/or unresolved please follow-up with your PCP or [facility] health urology for further evaluation.

## 2023-10-30 LAB — URINE CULTURE: Culture: >=100000 — AB

## 2023-11-11 ENCOUNTER — Ambulatory Visit: Admitting: Family Medicine

## 2023-11-11 ENCOUNTER — Encounter: Payer: Self-pay | Admitting: Family Medicine

## 2023-11-11 VITALS — BP 123/65 | HR 73 | Temp 97.6°F | Resp 18 | Ht 63.0 in | Wt 141.3 lb

## 2023-11-11 DIAGNOSIS — M67814 Other specified disorders of tendon, left shoulder: Secondary | ICD-10-CM

## 2023-11-11 DIAGNOSIS — R399 Unspecified symptoms and signs involving the genitourinary system: Secondary | ICD-10-CM

## 2023-11-11 DIAGNOSIS — Z7689 Persons encountering health services in other specified circumstances: Secondary | ICD-10-CM | POA: Diagnosis not present

## 2023-11-11 DIAGNOSIS — M62838 Other muscle spasm: Secondary | ICD-10-CM

## 2023-11-11 LAB — POCT URINALYSIS DIP (CLINITEK)
Bilirubin, UA: NEGATIVE
Blood, UA: NEGATIVE
Glucose, UA: NEGATIVE mg/dL
Ketones, POC UA: NEGATIVE mg/dL
Leukocytes, UA: NEGATIVE
Nitrite, UA: NEGATIVE
POC PROTEIN,UA: NEGATIVE
Spec Grav, UA: 1.01 (ref 1.010–1.025)
Urobilinogen, UA: NEGATIVE U/dL — AB
pH, UA: 5 (ref 5.0–8.0)

## 2023-11-11 NOTE — Progress Notes (Signed)
 New Patient Office Visit  Subjective    Patient ID: Tiffany Bender, female    DOB: 01/17/1951  Age: 73 y.o. MRN: [id number]  CC:  Chief Complaint  Patient presents with   Establish Care    Patient is here to establish care with new PCP, Patient would like to discuss possible UTI, left shoulder found a knot on her shoulder, and she was also like to discuss she is having muscle spasms in her right leg, right hip, and in both feet     HPI Tiffany Bender presents to establish care  UTI Pt reports she was finishing a UTI.  She reports she hope it's gone. She says she feels like it's gone but would like to make sure.  Left shoulder Pt reports a knot to her left shoulder. Feels like it's enlarged. Some pain when her bra strap touches it. She reports she had issues with shoulder pain 3 years ago. She went through PT for her shoulder. She was having back issues and had to have surgery for her back which postponed her shoulder treatment. She has had MRI of her left shoulder that showed some tendinosis with tears back in 2022. She is having recurrent issues and pain with her shoulder and would like to have this evaluated.  Muscle spasms She reports it occurs in her right leg and her left leg. It comes on any time of day. She reports when the pain comes on, it will cause her leg to twist involuntarily.  Outpatient Encounter Medications as of 11/11/2023  Medication Sig   ascorbic acid (VITAMIN C) 500 MG tablet Take 1,000 mg by mouth daily.   aspirin EC 81 MG tablet Take 81 mg by mouth daily. Swallow whole.   BORON PO Take 6 mg by mouth.   Cranberry 405 MG CAPS 870 mg.   hydrocortisone 1 % ointment Apply 1 application topically 2 (two) times daily as needed for itching.   Probiotic TBEC Take 250 mg by mouth See admin instructions.   Cholecalciferol 125 MCG (5000 UT) TABS Take 2,000 Units by mouth.   [DISCONTINUED] triamcinolone cream (KENALOG) 0.1 % SMARTSIG:Topical 1-2 Times Daily PRN  (Patient not taking: Reported on 11/11/2023)   No facility-administered encounter medications on file as of 11/11/2023.    Past Medical History:  Diagnosis Date   C. difficile diarrhea    DDD (degenerative disc disease), lumbar    Dyspareunia in female 08/21/2015   Fatty liver    Hyperlipidemia    KNEE PAIN, RIGHT 07/20/2006   Qualifier: Diagnosis of  By: Linford Arnold MD, Catherine     LIVER FUNCTION TESTS, ABNORMAL 09/28/2007   Qualifier: Diagnosis of  By: Laural Benes LPN, Kim     Neck pain    Osteopenia    Other and unspecified ovarian cysts    history   Vaginal atrophy 09/02/2021    Past Surgical History:  Procedure Laterality Date   ARTHROSCOPIC REPAIR ACL     RT knee   BUNIONECTOMY  08/23/1992   bilateral   CARPAL TUNNEL RELEASE Right    CARPAL TUNNEL RELEASE  08/23/1998   Left    OVARIAN CYST REMOVAL  08/23/1986   left    POSTERIOR LUMBAR LAMINECTOMY L4-S1 AND FUSION WITH INSTRUMENTATION, L4-L5 TRANSFORAMINAL LUMBAR INTERBODY FUSION AND BONE   04/20/2021   TONSILLECTOMY     right   TUBAL LIGATION     VEIN LIGATION AND STRIPPING  08/24/1979    Family History  Adopted:  Yes  Problem Relation Age of Onset   Alcohol abuse Father     Social History   Socioeconomic History   Marital status: Married    Spouse name: RV   Number of children: 4   Years of education: Not on file   Highest education level: Not on file  Occupational History   Occupation: Homemaker   Tobacco Use   Smoking status: Former    Current packs/day: 0.00    Types: Cigarettes    Quit date: 08/24/1983    Years since quitting: 40.2    Passive exposure: Never   Smokeless tobacco: Never  Vaping Use   Vaping status: Never Used  Substance and Sexual Activity   Alcohol use: Yes    Comment: socially   Drug use: No   Sexual activity: Yes    Partners: Male    Comment: Married; adopted, currently unemployed, college education, 2 children, caffeine 2-3 day, former huwsband abusinve  Other Topics  Concern   Not on file  Social History Narrative   No regular exercise. 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 She was adopted at age 82. She does drink coffee daily. She has 2 biological children and 2 adopted children.   Social Drivers of Corporate investment banker Strain: Low Risk  (05/02/2023)   Overall Financial Resource Strain (CARDIA)    Difficulty of Paying Living Expenses: Not hard at all  Food Insecurity: Food Insecurity Present (05/02/2023)   Hunger Vital Sign    Worried About Running Out of Food in the Last Year: Sometimes true    Ran Out of Food in the Last Year: Never true  Transportation Needs: No Transportation Needs (05/04/2023)   PRAPARE - Administrator, Civil Service (Medical): No    Lack of Transportation (Non-Medical): No  Physical Activity: Sufficiently Active (05/02/2023)   Exercise Vital Sign    Days of Exercise per Week: 5 days    Minutes of Exercise per Session: 30 min  Stress: No Stress Concern Present (05/02/2023)   [employer] of Occupational Health - Occupational Stress Questionnaire    Feeling of Stress : Not at all  Social Connections: Moderately Integrated (05/04/2023)   Social Connection and Isolation Panel [NHANES]    Frequency of Communication with Friends and Family: Once a week    Frequency of Social Gatherings with Friends and Family: Once a week    Attends Religious Services: More than 4 times per year    Active Member of [employer] or Organizations: Yes    Attends Banker Meetings: More than 4 times per year    Marital Status: Married  Catering manager Violence: Not At Risk (05/02/2023)   Humiliation, Afraid, Rape, and Kick questionnaire    Fear of Current or Ex-Partner: No    Emotionally Abused: No    Physically Abused: No    Sexually Abused: No    Review of Systems  Genitourinary:  Positive for dysuria.  Musculoskeletal:  Positive for joint pain.       Left shoulder pain Spasms of bilateral legs  All  other systems reviewed and are negative.      Objective    BP 123/65   Pulse 73   Temp 97.6 F (36.4 C) (Oral)   Resp 18   Ht 5\' 3"  (1.6 m)   Wt 141 lb 4.8 oz (64.1 kg)   SpO2 97%   BMI 25.03 kg/m   Physical Exam Vitals and nursing note  reviewed.  Constitutional:      Appearance: Normal appearance. She is normal weight.  HENT:     Head: Normocephalic and atraumatic.     Right Ear: External ear normal.     Left Ear: External ear normal.     Nose: Nose normal.     Mouth/Throat:     Mouth: Mucous membranes are moist.     Pharynx: Oropharynx is clear.  Eyes:     Conjunctiva/sclera: Conjunctivae normal.     Pupils: Pupils are equal, round, and reactive to light.  Cardiovascular:     Rate and Rhythm: Normal rate and regular rhythm.     Pulses: Normal pulses.     Heart sounds: Normal heart sounds.  Pulmonary:     Effort: Pulmonary effort is normal.     Breath sounds: Normal breath sounds.  Musculoskeletal:     Comments: Diminished ROM of left shoulder with abduction and bony prominence to [facility] joint.  Skin:    General: Skin is warm.     Capillary Refill: Capillary refill takes less than 2 seconds.  Neurological:     General: No focal deficit present.     Mental Status: She is alert and oriented to person, place, and time. Mental status is at baseline.  Psychiatric:        Mood and Affect: Mood normal.        Behavior: Behavior normal.        Thought Content: Thought content normal.        Judgment: Judgment normal.      Assessment & Plan:   Problem List Items Addressed This Visit   None Visit Diagnoses       Encounter to establish care with new doctor    -  Primary     UTI symptoms       Relevant Orders   POCT URINALYSIS DIP (CLINITEK)     Encounter to establish care with new doctor  UTI symptoms -     POCT URINALYSIS DIP (CLINITEK) -     Urine Culture  Tendinosis of left shoulder -     Ambulatory referral to Orthopedic Surgery  Muscle spasms of  both lower extremities -     Magnesium -     Basic metabolic panel   Pt with recent treatment of UTI with Macrobid. Still thinks she has UTI. Urine dip negative but will also send for culture since pt recently completed antibiotics. She reports a hx of C diff in 2022 so tries to avoid antibiotics unless she has to take them. She has acute on chronic left shoulder pain. Reviewed MRI of left shoulder from 2022. She was lost to follow up on this due to her back surgery. She would like to forego treatment of her left shoulder. Will refer to Ortho Surgery. Explained to pt that her muscle spasms could be multifactorial with hx of back surgery along with possible electrolyte disturbances. Will screen BMP and Mag today.  To follow up on results  No follow-ups on file.   Suzan Slick, MD

## 2023-11-12 LAB — BASIC METABOLIC PANEL
BUN/Creatinine Ratio: 25 (ref 12–28)
BUN: 15 mg/dL (ref 8–27)
CO2: 23 mmol/L (ref 20–29)
Calcium: 9.6 mg/dL (ref 8.7–10.3)
Chloride: 105 mmol/L (ref 96–106)
Creatinine, Ser: 0.61 mg/dL (ref 0.57–1.00)
Glucose: 76 mg/dL (ref 70–99)
Potassium: 4.8 mmol/L (ref 3.5–5.2)
Sodium: 142 mmol/L (ref 134–144)
eGFR: 96 mL/min/{1.73_m2} (ref >59)

## 2023-11-13 LAB — URINE CULTURE: Organism ID, Bacteria: NO GROWTH

## 2023-11-14 ENCOUNTER — Other Ambulatory Visit: Payer: Self-pay | Admitting: Family Medicine

## 2023-11-14 DIAGNOSIS — M62838 Other muscle spasm: Secondary | ICD-10-CM | POA: Diagnosis not present

## 2023-11-15 LAB — MAGNESIUM: Magnesium: 2.1 mg/dL (ref 1.6–2.3)

## 2023-11-23 ENCOUNTER — Ambulatory Visit (HOSPITAL_BASED_OUTPATIENT_CLINIC_OR_DEPARTMENT_OTHER)

## 2023-11-23 ENCOUNTER — Ambulatory Visit (HOSPITAL_BASED_OUTPATIENT_CLINIC_OR_DEPARTMENT_OTHER): Admitting: Orthopaedic Surgery

## 2023-11-23 DIAGNOSIS — G8929 Other chronic pain: Secondary | ICD-10-CM

## 2023-11-23 DIAGNOSIS — M25512 Pain in left shoulder: Secondary | ICD-10-CM

## 2023-11-23 NOTE — Progress Notes (Signed)
 Chief Complaint: Left shoulder pain     History of Present Illness:    Tiffany Bender is a 73 y.o. female presents today with ongoing left shoulder pain which she has had for the last several years.  She did unfortunately require a lumbar surgery for which she states her shoulder was feeling better but unfortunately as she has been more active she is experiencing recurrent shoulder pain.  She enjoys gardening and working outside.  She has pain with overhead motion.  She does have a previous MRI from several years prior which did show evidence of rotator cuff impingement    PMH/PSH/Family History/Social History/Meds/Allergies:    Past Medical History:  Diagnosis Date   C. difficile diarrhea    DDD (degenerative disc disease), lumbar    Dyspareunia in female 08/21/2015   Fatty liver    Hyperlipidemia    KNEE PAIN, RIGHT 07/20/2006   Qualifier: Diagnosis of  By: Linford Arnold MD, Catherine     LIVER FUNCTION TESTS, ABNORMAL 09/28/2007   Qualifier: Diagnosis of  By: Laural Benes LPN, Kim     Neck pain    Osteopenia    Other and unspecified ovarian cysts    history   Vaginal atrophy 09/02/2021   Past Surgical History:  Procedure Laterality Date   ARTHROSCOPIC REPAIR ACL     RT knee   BUNIONECTOMY  08/23/1992   bilateral   CARPAL TUNNEL RELEASE Right    CARPAL TUNNEL RELEASE  08/23/1998   Left    OVARIAN CYST REMOVAL  08/23/1986   left    POSTERIOR LUMBAR LAMINECTOMY L4-S1 AND FUSION WITH INSTRUMENTATION, L4-L5 TRANSFORAMINAL LUMBAR INTERBODY FUSION AND BONE   04/20/2021   TONSILLECTOMY     right   TUBAL LIGATION     VEIN LIGATION AND STRIPPING  08/24/1979   Social History   Socioeconomic History   Marital status: Married    Spouse name: RV   Number of children: 4   Years of education: Not on file   Highest education level: Not on file  Occupational History   Occupation: Homemaker   Tobacco Use   Smoking status: Former    Current packs/day: 0.00    Types:  Cigarettes    Quit date: 08/24/1983    Years since quitting: 40.2    Passive exposure: Never   Smokeless tobacco: Never  Vaping Use   Vaping status: Never Used  Substance and Sexual Activity   Alcohol use: Yes    Comment: socially   Drug use: No   Sexual activity: Yes    Partners: Male    Comment: Married; adopted, currently unemployed, college education, 2 children, caffeine 2-3 day, former huwsband abusinve  Other Topics Concern   Not on file  Social History Narrative   No regular exercise. 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 She was adopted at age 52. She does drink coffee daily. She has 2 biological children and 2 adopted children.   Social Drivers of Corporate investment banker Strain: Low Risk  (05/02/2023)   Overall Financial Resource Strain (CARDIA)    Difficulty of Paying Living Expenses: Not hard at all  Food Insecurity: Food Insecurity Present (05/02/2023)   Hunger Vital Sign    Worried About Running Out of Food in the Last Year: Sometimes true    Ran Out of Food in the Last Year: Never true  Transportation Needs: No Transportation Needs (05/04/2023)   PRAPARE - Transportation    Lack  of Transportation (Medical): No    Lack of Transportation (Non-Medical): No  Physical Activity: Sufficiently Active (05/02/2023)   Exercise Vital Sign    Days of Exercise per Week: 5 days    Minutes of Exercise per Session: 30 min  Stress: No Stress Concern Present (05/02/2023)   [employer] of Occupational Health - Occupational Stress Questionnaire    Feeling of Stress : Not at all  Social Connections: Moderately Integrated (05/04/2023)   Social Connection and Isolation Panel [NHANES]    Frequency of Communication with Friends and Family: Once a week    Frequency of Social Gatherings with Friends and Family: Once a week    Attends Religious Services: More than 4 times per year    Active Member of [employer] or Organizations: Yes    Attends Engineer, structural:  More than 4 times per year    Marital Status: Married   Family History  Adopted: Yes  Problem Relation Age of Onset   Alcohol abuse Father    Allergies  Allergen Reactions   Ciprofloxacin Other (See Comments) and Swelling    Pelvic swelling Other reaction(s): Other Swelling genitalia, high fever. Went to ED     Fenofibrate Other (See Comments)   Morphine Rash   Penicillins Other (See Comments)    Head ache. rash Headaches.     Statins Other (See Comments)    Dark brown urine.  Dark unrine Other reaction(s): Other Dark brown urine.  Urine got really dark Has tried Crestor and Lipitor    Sulfamethoxazole-Trimethoprim Other (See Comments)    Passed out, Eyes Swollen   Tizanidine Hcl Other (See Comments)    Dizzy, weak, throat swelling    Amoxicillin     headache   Dexamethasone Nausea Only    dizzy   Latex Itching and Rash   Current Outpatient Medications  Medication Sig Dispense Refill   ascorbic acid (VITAMIN C) 500 MG tablet Take 1,000 mg by mouth daily.     aspirin EC 81 MG tablet Take 81 mg by mouth daily. Swallow whole.     BORON PO Take 6 mg by mouth.     Cholecalciferol 125 MCG (5000 UT) TABS Take 2,000 Units by mouth.     Cranberry 405 MG CAPS 870 mg.     hydrocortisone 1 % ointment Apply 1 application topically 2 (two) times daily as needed for itching. 30 g 1   Probiotic TBEC Take 250 mg by mouth See admin instructions.     No current facility-administered medications for this visit.   No results found.  Review of Systems:   A ROS was performed including pertinent positives and negatives as documented in the HPI.  Physical Exam :   Constitutional: NAD and appears stated age Neurological: Alert and oriented Psych: Appropriate affect and cooperative There were no vitals taken for this visit.   Comprehensive Musculoskeletal Exam:    Pain with overhead range of motion.  Active range of motion is 160 compared to 170 on the contralateral side.   External rotation of the side is to 40 equal to contralateral.  Internal rotation is to L1 bilaterally positive Neer impingement   Imaging:   Xray (3 views left shoulder): Normal    I personally reviewed and interpreted the radiographs.   Assessment and Plan:   73 y.o. female with left shoulder pain consistent with possible rotator cuff tear and progression in the setting of known rotator cuff impingement.  At today's visit I discussed that  I would like to obtain additional MRI so that we can further assess the shoulder given the fact that she has.  Previous therapy without any relief.  I would like to obtain this and I will see her back following the results  -For MRI left shoulder and follow-up to discuss results   I personally saw and evaluated the patient, and participated in the management and treatment plan.  Huel Cote, MD Attending Physician, Orthopedic Surgery  This document was dictated using Dragon voice recognition software. A reasonable attempt at proof reading has been made to minimize errors.

## 2023-11-24 ENCOUNTER — Telehealth: Payer: Self-pay

## 2023-11-24 NOTE — Telephone Encounter (Signed)
 Spoke with patient , and informed her of her lab results for the magnesium . Patient gave a verbal understanding   Copied from CRM [id number]. Topic: Clinical - Lab/Test Results >> Nov 24, 2023  9:50 AM Shelah Lewandowsky wrote: Reason for CRM: please call regarding lab results [phone]

## 2023-11-30 ENCOUNTER — Telehealth (HOSPITAL_BASED_OUTPATIENT_CLINIC_OR_DEPARTMENT_OTHER): Payer: Self-pay | Admitting: Orthopaedic Surgery

## 2023-11-30 NOTE — Telephone Encounter (Signed)
 Patient states going to get a referral from her back DR and she will get both MRI at same time. I told that was fine just call the office to let us know and we will sch her for follow up Mri with [place]

## 2023-12-13 DIAGNOSIS — M4726 Other spondylosis with radiculopathy, lumbar region: Secondary | ICD-10-CM | POA: Diagnosis not present

## 2023-12-13 DIAGNOSIS — M50122 Cervical disc disorder at C5-C6 level with radiculopathy: Secondary | ICD-10-CM | POA: Diagnosis not present

## 2023-12-13 DIAGNOSIS — M4326 Fusion of spine, lumbar region: Secondary | ICD-10-CM | POA: Diagnosis not present

## 2023-12-13 DIAGNOSIS — M50123 Cervical disc disorder at C6-C7 level with radiculopathy: Secondary | ICD-10-CM | POA: Diagnosis not present

## 2023-12-26 ENCOUNTER — Telehealth (HOSPITAL_BASED_OUTPATIENT_CLINIC_OR_DEPARTMENT_OTHER): Payer: Self-pay | Admitting: Orthopaedic Surgery

## 2023-12-26 NOTE — Telephone Encounter (Signed)
 Patient states that she would like to get her right shoulder checked out before she gets her MRI so she can get an MRI done on both shoulders if need be. Patient is already sch

## 2023-12-28 ENCOUNTER — Encounter (HOSPITAL_BASED_OUTPATIENT_CLINIC_OR_DEPARTMENT_OTHER): Payer: Self-pay | Admitting: Student

## 2023-12-28 ENCOUNTER — Ambulatory Visit (INDEPENDENT_AMBULATORY_CARE_PROVIDER_SITE_OTHER)

## 2023-12-28 ENCOUNTER — Ambulatory Visit (INDEPENDENT_AMBULATORY_CARE_PROVIDER_SITE_OTHER): Admitting: Student

## 2023-12-28 DIAGNOSIS — M25511 Pain in right shoulder: Secondary | ICD-10-CM | POA: Diagnosis not present

## 2023-12-28 NOTE — Progress Notes (Signed)
 Chief Complaint: Right shoulder pain    Discussed the use of AI scribe software for clinical note transcription with the patient, who gave verbal consent to proceed.  History of Present Illness Tiffany Bender is a 73 year old female with a history of shoulder injuries and lower back surgery who presents with right shoulder pain.  This began approximately 2 weeks ago after her [place] Shepherd pulled on the leash.  She experiences right shoulder pain that limits her ability to lift her arm and perform daily activities. The pain radiates down the arm and is associated with a popping sensation in the shoulder. Physical activity worsens the pain, while Aleve provides some relief. She is right-handed, making the current right shoulder pain particularly challenging. Previous physical therapy for her left shoulder improved her condition, but she is unable to perform similar exercises for her right shoulder due to pain.  She does have ongoing issues with the left shoulder as well, and has an MRI ordered for further evaluation.  Previous MRI back in 2022 demonstrated partial supraspinatus tearing, as well as diffuse tendinosis and a potential labral tear.  Surgical History:   None  PMH/PSH/Family History/Social History/Meds/Allergies:    Past Medical History:  Diagnosis Date   C. difficile diarrhea    DDD (degenerative disc disease), lumbar    Dyspareunia in female 08/21/2015   Fatty liver    Hyperlipidemia    KNEE PAIN, RIGHT 07/20/2006   Qualifier: Diagnosis of  By: Greer Leak MD, Catherine     LIVER FUNCTION TESTS, ABNORMAL 09/28/2007   Qualifier: Diagnosis of  By: Lincoln Renshaw LPN, Kim     Neck pain    Osteopenia    Other and unspecified ovarian cysts    history   Vaginal atrophy 09/02/2021   Past Surgical History:  Procedure Laterality Date   ARTHROSCOPIC REPAIR ACL     RT knee   BUNIONECTOMY  08/23/1992   bilateral   CARPAL TUNNEL RELEASE Right     CARPAL TUNNEL RELEASE  08/23/1998   Left    OVARIAN CYST REMOVAL  08/23/1986   left    POSTERIOR LUMBAR LAMINECTOMY L4-S1 AND FUSION WITH INSTRUMENTATION, L4-L5 TRANSFORAMINAL LUMBAR INTERBODY FUSION AND BONE   04/20/2021   TONSILLECTOMY     right   TUBAL LIGATION     VEIN LIGATION AND STRIPPING  08/24/1979   Social History   Socioeconomic History   Marital status: Married    Spouse name: RV   Number of children: 4   Years of education: Not on file   Highest education level: Not on file  Occupational History   Occupation: Homemaker   Tobacco Use   Smoking status: Former    Current packs/day: 0.00    Types: Cigarettes    Quit date: 08/24/1983    Years since quitting: 40.3    Passive exposure: Never   Smokeless tobacco: Never  Vaping Use   Vaping status: Never Used  Substance and Sexual Activity   Alcohol use: Yes    Comment: socially   Drug use: No   Sexual activity: Yes    Partners: Male    Comment: Married; adopted, currently unemployed, college education, 2 children, caffeine 2-3 day, former huwsband abusinve  Other Topics Concern   Not on file  Social History Narrative   No  regular exercise. 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 She was adopted at age 78. She does drink coffee daily. She has 2 biological children and 2 adopted children.   Social Drivers of Corporate investment banker Strain: Low Risk  (05/02/2023)   Overall Financial Resource Strain (CARDIA)    Difficulty of Paying Living Expenses: Not hard at all  Food Insecurity: Food Insecurity Present (05/02/2023)   Hunger Vital Sign    Worried About Running Out of Food in the Last Year: Sometimes true    Ran Out of Food in the Last Year: Never true  Transportation Needs: No Transportation Needs (05/04/2023)   PRAPARE - Administrator, Civil Service (Medical): No    Lack of Transportation (Non-Medical): No  Physical Activity: Sufficiently Active (05/02/2023)   Exercise Vital Sign     Days of Exercise per Week: 5 days    Minutes of Exercise per Session: 30 min  Stress: No Stress Concern Present (05/02/2023)   [employer] of Occupational Health - Occupational Stress Questionnaire    Feeling of Stress : Not at all  Social Connections: Moderately Integrated (05/04/2023)   Social Connection and Isolation Panel [NHANES]    Frequency of Communication with Friends and Family: Once a week    Frequency of Social Gatherings with Friends and Family: Once a week    Attends Religious Services: More than 4 times per year    Active Member of [employer] or Organizations: Yes    Attends Engineer, structural: More than 4 times per year    Marital Status: Married   Family History  Adopted: Yes  Problem Relation Age of Onset   Alcohol abuse Father    Allergies  Allergen Reactions   Ciprofloxacin  Other (See Comments) and Swelling    Pelvic swelling Other reaction(s): Other Swelling genitalia, high fever. Went to ED     Fenofibrate Other (See Comments)   Morphine Rash   Penicillins Other (See Comments)    Head ache. rash Headaches.     Statins Other (See Comments)    Dark brown urine.  Dark unrine Other reaction(s): Other Dark brown urine.  Urine got really dark Has tried Crestor and Lipitor    Sulfamethoxazole -Trimethoprim Other (See Comments)    Passed out, Eyes Swollen   Tizanidine Hcl Other (See Comments)    Dizzy, weak, throat swelling    Amoxicillin     headache   Dexamethasone  Nausea Only    dizzy   Latex Itching and Rash   Current Outpatient Medications  Medication Sig Dispense Refill   ascorbic acid (VITAMIN C) 500 MG tablet Take 1,000 mg by mouth daily.     aspirin EC 81 MG tablet Take 81 mg by mouth daily. Swallow whole.     BORON PO Take 6 mg by mouth.     Cholecalciferol 125 MCG (5000 UT) TABS Take 2,000 Units by mouth.     Cranberry 405 MG CAPS 870 mg.     hydrocortisone  1 % ointment Apply 1 application topically 2 (two) times daily as needed  for itching. 30 g 1   Probiotic TBEC Take 250 mg by mouth See admin instructions.     No current facility-administered medications for this visit.   No results found.  Review of Systems:   A ROS was performed including pertinent positives and negatives as documented in the HPI.  Physical Exam :   Constitutional: NAD and appears stated age Neurological: Alert and  oriented Psych: Appropriate affect and cooperative There were no vitals taken for this visit.   Comprehensive Musculoskeletal Exam:    Musculoskeletal Exam    Inspection Right Left  Skin No atrophy or winging No atrophy or winging  Palpation    Tenderness Lateral deltoid None  Range of Motion    Flexion (passive) 120 140  Flexion (active) 90 N/A  Abduction 50 N/A  ER at the side 40 40  Can reach behind back to Back pocket L4  Strength     Minimal 4/5  Special Tests    Pseudoparalytic No No  Neurologic    Fires PIN, radial, median, ulnar, musculocutaneous, axillary, suprascapular, long thoracic, and spinal accessory innervated muscles. No abnormal sensibility  Vascular/Lymphatic    Radial Pulse 2+ 2+  Cervical Exam    Patient has symmetric cervical range of motion with negative Spurling's test.  Special Test: Positive right Neer, Hawkins, and empty can     Imaging:   Xray (right shoulder 3 views): Mild AC joint degenerative changes   I personally reviewed and interpreted the radiographs.      Assessment & Plan Acute right shoulder pain Pain is noted with limited range of motion and functional impairment. Differential includes a spectrum of rotator cuff tendinopathy and an MRI is needed for further assessment and to rule out a significant tear. Order an open MRI of the right shoulder. She prefers to avoid injections due to past adverse reactions. Continue Aleve for pain management, up to three times a day with at least six-hour intervals. Encourage light exercises as tolerated.     I personally saw and  evaluated the patient, and participated in the management and treatment plan.  Sharrell Deck, PA-C Orthopedics

## 2023-12-30 DIAGNOSIS — M48061 Spinal stenosis, lumbar region without neurogenic claudication: Secondary | ICD-10-CM | POA: Diagnosis not present

## 2023-12-30 DIAGNOSIS — M51379 Other intervertebral disc degeneration, lumbosacral region without mention of lumbar back pain or lower extremity pain: Secondary | ICD-10-CM | POA: Diagnosis not present

## 2023-12-30 DIAGNOSIS — M47816 Spondylosis without myelopathy or radiculopathy, lumbar region: Secondary | ICD-10-CM | POA: Diagnosis not present

## 2023-12-30 DIAGNOSIS — Z981 Arthrodesis status: Secondary | ICD-10-CM | POA: Diagnosis not present

## 2023-12-30 DIAGNOSIS — M51369 Other intervertebral disc degeneration, lumbar region without mention of lumbar back pain or lower extremity pain: Secondary | ICD-10-CM | POA: Diagnosis not present

## 2024-01-09 ENCOUNTER — Telehealth (HOSPITAL_BASED_OUTPATIENT_CLINIC_OR_DEPARTMENT_OTHER): Payer: Self-pay | Admitting: Orthopaedic Surgery

## 2024-01-09 NOTE — Telephone Encounter (Signed)
 Patients came in to see Tiffany Bender and was told that he would talk to Dr B about her shoulder. Patient states that she has not heard anything and was just following up. Best contact [phone]

## 2024-01-10 NOTE — Telephone Encounter (Signed)
 I called [facility] and the scheduler informed me she was  scheduled to have MRI of " LEFT" shoulder on May 9th but she cancelled it because she wanted to see if provider could do both shoulders. Dr. Hermina Loosen ordered the L shoulder in April and Cleora Daft ordered the Right in May. [facility] said they sent over order wanting to know if can do both. I never received anything.   I will go ahead and send over both orders, unless there is just the one you are concentrating on. Please advise.

## 2024-01-10 NOTE — Telephone Encounter (Signed)
 Tiffany Manners do you have a update on this?  Also lvm advising pt we are looking into the MRI's and scheduling

## 2024-01-11 ENCOUNTER — Encounter (HOSPITAL_BASED_OUTPATIENT_CLINIC_OR_DEPARTMENT_OTHER): Admitting: Orthopaedic Surgery

## 2024-01-12 ENCOUNTER — Ambulatory Visit (INDEPENDENT_AMBULATORY_CARE_PROVIDER_SITE_OTHER): Admitting: Family Medicine

## 2024-01-12 ENCOUNTER — Encounter: Payer: Self-pay | Admitting: Family Medicine

## 2024-01-12 VITALS — BP 124/68 | HR 75 | Temp 98.1°F | Resp 18 | Ht 63.0 in | Wt 140.7 lb

## 2024-01-12 DIAGNOSIS — R399 Unspecified symptoms and signs involving the genitourinary system: Secondary | ICD-10-CM | POA: Diagnosis not present

## 2024-01-12 LAB — POCT URINALYSIS DIP (CLINITEK)
Bilirubin, UA: NEGATIVE
Glucose, UA: NEGATIVE mg/dL
Ketones, POC UA: NEGATIVE mg/dL
Nitrite, UA: NEGATIVE
POC PROTEIN,UA: NEGATIVE
Spec Grav, UA: <=1.005 — AB (ref 1.010–1.025)
Urobilinogen, UA: 0.2 U/dL
pH, UA: 5 (ref 5.0–8.0)

## 2024-01-12 MED ORDER — NITROFURANTOIN MONOHYD MACRO 100 MG PO CAPS
100.0000 mg | ORAL_CAPSULE | Freq: Two times a day (BID) | ORAL | 0 refills | Status: DC
Start: 2024-01-12 — End: 2024-05-07

## 2024-01-12 NOTE — Progress Notes (Signed)
 Acute Office Visit  Subjective:     Patient ID: Tiffany Bender, female    DOB: [birth year], 73 y.o.   MRN: [id number]  Chief Complaint  Patient presents with   Urinary Tract Infection    Patient is here today with complaints of a UTI. She states that she has had symptoms for about a week. Symptoms are burning while urinating, frequency, and Urgency. Patient denies any lower back pain.     HPI Patient is in today for acute visit  Pt reports she has cloudy urine for the last few weeks. She has dysuria with urination. She has increased cranberry juice then. She has had some urinary frequency as well. Still eating well. No flank pain or fevers.    Review of Systems  Genitourinary:  Positive for dysuria and frequency. Negative for flank pain and hematuria.  All other systems reviewed and are negative.       Objective:    BP 124/68   Pulse 75   Temp 98.1 F (36.7 C) (Oral)   Resp 18   Ht 5\' 3"  (1.6 m)   Wt 140 lb 11.2 oz (63.8 kg)   SpO2 97%   BMI 24.92 kg/m  BP Readings from Last 3 Encounters:  01/12/24 124/68  11/11/23 123/65  10/28/23 (!) 156/89      Physical Exam Vitals and nursing note reviewed.  Constitutional:      Appearance: Normal appearance. She is normal weight.  HENT:     Head: Normocephalic and atraumatic.     Right Ear: External ear normal.     Left Ear: External ear normal.     Nose: Nose normal.     Mouth/Throat:     Mouth: Mucous membranes are moist.     Pharynx: Oropharynx is clear.  Eyes:     Conjunctiva/sclera: Conjunctivae normal.     Pupils: Pupils are equal, round, and reactive to light.  Cardiovascular:     Rate and Rhythm: Normal rate.  Pulmonary:     Effort: Pulmonary effort is normal.  Abdominal:     General: Abdomen is flat. Bowel sounds are normal. There is no distension.     Tenderness: There is no abdominal tenderness. There is no right CVA tenderness, left CVA tenderness or guarding.  Skin:    General: Skin is warm.      Capillary Refill: Capillary refill takes less than 2 seconds.  Neurological:     General: No focal deficit present.     Mental Status: She is alert and oriented to person, place, and time. Mental status is at baseline.  Psychiatric:        Mood and Affect: Mood normal.        Behavior: Behavior normal.        Thought Content: Thought content normal.        Judgment: Judgment normal.    Results for orders placed or performed in visit on 01/12/24  POCT URINALYSIS DIP (CLINITEK)  Result Value Ref Range   Color, UA yellow yellow   Clarity, UA cloudy (A) clear   Glucose, UA negative negative mg/dL   Bilirubin, UA negative negative   Ketones, POC UA negative negative mg/dL   Spec Grav, UA <=4.098 (A) 1.010 - 1.025   Blood, UA trace-intact (A) negative   pH, UA 5.0 5.0 - 8.0   POC PROTEIN,UA negative negative, trace   Urobilinogen, UA 0.2 0.2 or 1.0 E.U./dL   Nitrite, UA Negative Negative   Leukocytes,  UA Large (3+) (A) Negative        Assessment & Plan:   Problem List Items Addressed This Visit   None Visit Diagnoses       UTI symptoms    -  Primary   Relevant Medications   nitrofurantoin , macrocrystal-monohydrate, (MACROBID ) 100 MG capsule   Other Relevant Orders   POCT URINALYSIS DIP (CLINITEK) (Completed)   Urine Culture       Meds ordered this encounter  Medications   nitrofurantoin , macrocrystal-monohydrate, (MACROBID ) 100 MG capsule    Sig: Take 1 capsule (100 mg total) by mouth 2 (two) times daily.    Dispense:  10 capsule    Refill:  0  Urine dip +. Send for culture and empirically treat with Macrobid . To follow up on culture.  Return for next scheduled.  Manette Section, MD

## 2024-01-15 LAB — URINE CULTURE

## 2024-01-17 ENCOUNTER — Ambulatory Visit: Payer: Self-pay | Admitting: Family Medicine

## 2024-01-17 NOTE — Telephone Encounter (Signed)
 Spoke with patient and she is aware of message from provider regarding urine culture. Patient gave a verbal understanding and agreed to call by if symptoms continued.

## 2024-01-17 NOTE — Telephone Encounter (Signed)
-----   Message from Manette Section sent at 01/17/2024  8:12 AM EDT ----- Please inform pt her urine culture was positive but the antibiotic sent in; should work to clear the infection. If her symptoms continue after completion of the antibiotic, let me know.

## 2024-01-19 DIAGNOSIS — M5431 Sciatica, right side: Secondary | ICD-10-CM | POA: Diagnosis not present

## 2024-01-19 DIAGNOSIS — Z1330 Encounter for screening examination for mental health and behavioral disorders, unspecified: Secondary | ICD-10-CM | POA: Diagnosis not present

## 2024-01-19 DIAGNOSIS — M48062 Spinal stenosis, lumbar region with neurogenic claudication: Secondary | ICD-10-CM | POA: Diagnosis not present

## 2024-01-19 DIAGNOSIS — M963 Postlaminectomy kyphosis: Secondary | ICD-10-CM | POA: Diagnosis not present

## 2024-01-19 DIAGNOSIS — M5432 Sciatica, left side: Secondary | ICD-10-CM | POA: Diagnosis not present

## 2024-01-23 ENCOUNTER — Telehealth (HOSPITAL_BASED_OUTPATIENT_CLINIC_OR_DEPARTMENT_OTHER): Payer: Self-pay | Admitting: Orthopaedic Surgery

## 2024-01-23 DIAGNOSIS — S43431A Superior glenoid labrum lesion of right shoulder, initial encounter: Secondary | ICD-10-CM | POA: Diagnosis not present

## 2024-01-23 DIAGNOSIS — M67912 Unspecified disorder of synovium and tendon, left shoulder: Secondary | ICD-10-CM | POA: Diagnosis not present

## 2024-01-23 DIAGNOSIS — M67921 Unspecified disorder of synovium and tendon, right upper arm: Secondary | ICD-10-CM | POA: Diagnosis not present

## 2024-01-23 DIAGNOSIS — M24112 Other articular cartilage disorders, left shoulder: Secondary | ICD-10-CM | POA: Diagnosis not present

## 2024-01-23 DIAGNOSIS — M7551 Bursitis of right shoulder: Secondary | ICD-10-CM | POA: Diagnosis not present

## 2024-01-23 DIAGNOSIS — M67813 Other specified disorders of tendon, right shoulder: Secondary | ICD-10-CM | POA: Diagnosis not present

## 2024-01-23 DIAGNOSIS — M19011 Primary osteoarthritis, right shoulder: Secondary | ICD-10-CM | POA: Diagnosis not present

## 2024-01-23 DIAGNOSIS — M24111 Other articular cartilage disorders, right shoulder: Secondary | ICD-10-CM | POA: Diagnosis not present

## 2024-01-23 DIAGNOSIS — M67911 Unspecified disorder of synovium and tendon, right shoulder: Secondary | ICD-10-CM | POA: Diagnosis not present

## 2024-01-23 DIAGNOSIS — M25411 Effusion, right shoulder: Secondary | ICD-10-CM | POA: Diagnosis not present

## 2024-01-23 NOTE — Telephone Encounter (Signed)
 Patient is getting MRI on Friday she wants to come in earlier for mri review than the first available June 18 please advise

## 2024-01-25 ENCOUNTER — Ambulatory Visit
Admission: RE | Admit: 2024-01-25 | Discharge: 2024-01-25 | Disposition: A | Discharge status: Home / Self Care | Source: Ambulatory Visit | Attending: Family Medicine | Admitting: Family Medicine

## 2024-01-25 DIAGNOSIS — Z1231 Encounter for screening mammogram for malignant neoplasm of breast: Secondary | ICD-10-CM | POA: Diagnosis not present

## 2024-01-31 ENCOUNTER — Ambulatory Visit: Payer: Self-pay | Admitting: Family Medicine

## 2024-02-01 ENCOUNTER — Ambulatory Visit (HOSPITAL_BASED_OUTPATIENT_CLINIC_OR_DEPARTMENT_OTHER): Admitting: Orthopaedic Surgery

## 2024-02-01 DIAGNOSIS — M25511 Pain in right shoulder: Secondary | ICD-10-CM | POA: Diagnosis not present

## 2024-02-01 MED ORDER — LIDOCAINE HCL 1 % IJ SOLN
4.0000 mL | INTRAMUSCULAR | Status: AC | PRN
Start: 2024-02-01 — End: 2024-02-01
  Administered 2024-02-01: 4 mL

## 2024-02-01 MED ORDER — TRIAMCINOLONE ACETONIDE 40 MG/ML IJ SUSP
80.0000 mg | INTRAMUSCULAR | Status: AC | PRN
Start: 2024-02-01 — End: 2024-02-01
  Administered 2024-02-01: 80 mg via INTRA_ARTICULAR

## 2024-02-01 NOTE — Progress Notes (Signed)
 Chief Complaint: Left shoulder pain     History of Present Illness:   02/01/2024: Presents today for follow-up of bilateral shoulders.  She is here today for MRI discussion  Tiffany Bender is a 73 y.o. female presents today with ongoing left shoulder pain which she has had for the last several years.  She did unfortunately require a lumbar surgery for which she states her shoulder was feeling better but unfortunately as she has been more active she is experiencing recurrent shoulder pain.  She enjoys gardening and working outside.  She has pain with overhead motion.  She does have a previous MRI from several years prior which did show evidence of rotator cuff impingement    PMH/PSH/Family History/Social History/Meds/Allergies:    Past Medical History:  Diagnosis Date  . C. difficile diarrhea   . DDD (degenerative disc disease), lumbar   . Dyspareunia in female 08/21/2015  . Fatty liver   . Hyperlipidemia   . KNEE PAIN, RIGHT 07/20/2006   Qualifier: Diagnosis of  By: Greer Leak MD, Bynum Cassis    . LIVER FUNCTION TESTS, ABNORMAL 09/28/2007   Qualifier: Diagnosis of  By: Lincoln Renshaw LPN, Kim    . Neck pain   . Osteopenia   . Other and unspecified ovarian cysts    history  . Vaginal atrophy 09/02/2021   Past Surgical History:  Procedure Laterality Date  . ARTHROSCOPIC REPAIR ACL     RT knee  . BUNIONECTOMY  08/23/1992   bilateral  . CARPAL TUNNEL RELEASE Right   . CARPAL TUNNEL RELEASE  08/23/1998   Left   . OVARIAN CYST REMOVAL  08/23/1986   left   . POSTERIOR LUMBAR LAMINECTOMY L4-S1 AND FUSION WITH INSTRUMENTATION, L4-L5 TRANSFORAMINAL LUMBAR INTERBODY FUSION AND BONE   04/20/2021  . TONSILLECTOMY     right  . TUBAL LIGATION    . VEIN LIGATION AND STRIPPING  08/24/1979   Social History   Socioeconomic History  . Marital status: Married    Spouse name: RV  . Number of children: 4  . Years of education: Not on file  . Highest education level: Not on file   Occupational History  . Occupation: Homemaker   Tobacco Use  . Smoking status: Former    Current packs/day: 0.00    Types: Cigarettes    Quit date: 08/24/1983    Years since quitting: 40.4    Passive exposure: Never  . Smokeless tobacco: Never  Vaping Use  . Vaping status: Never Used  Substance and Sexual Activity  . Alcohol use: Yes    Comment: socially  . Drug use: No  . Sexual activity: Yes    Partners: Male    Comment: Married; adopted, currently unemployed, college education, 2 children, caffeine 2-3 day, former huwsband abusinve  Other Topics Concern  . 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 She was adopted at age 67. She does drink coffee daily. She has 2 biological children and 2 adopted children.   Social Drivers of Corporate investment banker Strain: Low Risk  (01/19/2024)   Received [facility]   Overall Financial Resource Strain (CARDIA)   . Difficulty of Paying Living Expenses: Not hard at all  Food Insecurity: No Food Insecurity (01/19/2024)   Received [facility]   Hunger Vital Sign   . Worried About Programme researcher, broadcasting/film/video in the Last Year: Never true   .  Ran Out of Food in the Last Year: Never true  Transportation Needs: No Transportation Needs (01/19/2024)   Received from [facility] - Transportation   . Lack of Transportation (Medical): No   . Lack of Transportation (Non-Medical): No  Physical Activity: Sufficiently Active (05/02/2023)   Exercise Vital Sign   . Days of Exercise per Week: 5 days   . Minutes of Exercise per Session: 30 min  Stress: No Stress Concern Present (05/02/2023)   [employer] of Occupational Health - Occupational Stress Questionnaire   . Feeling of Stress : Not at all  Social Connections: Moderately Integrated (05/04/2023)   Social Connection and Isolation Panel [NHANES]   . Frequency of Communication with Friends and Family: Once a  week   . Frequency of Social Gatherings with Friends and Family: Once a week   . Attends Religious Services: More than 4 times per year   . Active Member of Clubs or Organizations: Yes   . Attends Banker Meetings: More than 4 times per year   . Marital Status: Married   Family History  Adopted: Yes  Problem Relation Age of Onset  . Alcohol abuse Father    Allergies  Allergen Reactions  . Ciprofloxacin  Other (See Comments) and Swelling    Pelvic swelling Other reaction(s): Other Swelling genitalia, high fever. Went to ED    . Fenofibrate Other (See Comments)  . Morphine Rash  . Penicillins Other (See Comments)    Head ache. rash Headaches.    . Statins Other (See Comments)    Dark brown urine.  Dark unrine Other reaction(s): Other Dark brown urine.  Urine got really dark Has tried Crestor and Lipitor   . Sulfamethoxazole -Trimethoprim Other (See Comments)    Passed out, Eyes Swollen  . Tizanidine Hcl Other (See Comments)    Dizzy, weak, throat swelling   . Amoxicillin     headache  . Dexamethasone  Nausea Only    dizzy  . Latex Itching and Rash   Current Outpatient Medications  Medication Sig Dispense Refill  . ascorbic acid (VITAMIN C) 500 MG tablet Take 1,000 mg by mouth daily.    Aaron Aas aspirin EC 81 MG tablet Take 81 mg by mouth daily. Swallow whole.    Aaron Aas BORON PO Take 6 mg by mouth.    . Cholecalciferol 125 MCG (5000 UT) TABS Take 2,000 Units by mouth.    . Cranberry 405 MG CAPS 870 mg.    . hydrocortisone  1 % ointment Apply 1 application topically 2 (two) times daily as needed for itching. 30 g 1  . nitrofurantoin , macrocrystal-monohydrate, (MACROBID ) 100 MG capsule Take 1 capsule (100 mg total) by mouth 2 (two) times daily. 10 capsule 0  . Probiotic TBEC Take 250 mg by mouth See admin instructions.     No current facility-administered medications for this visit.   No results found.  Review of Systems:   A ROS was performed including pertinent  positives and negatives as documented in the HPI.  Physical Exam :   Constitutional: NAD and appears stated age Neurological: Alert and oriented Psych: Appropriate affect and cooperative There were no vitals taken for this visit.   Comprehensive Musculoskeletal Exam:    Pain with overhead range of motion.  Active range of motion is 160 compared to 170 on the contralateral side.  External rotation of the side is to 40 equal to contralateral.  Internal rotation is to L1 bilaterally positive Neer impingement   Imaging:  Xray (3 views left shoulder): Normal  MRI right shoulder, MRI left shoulder: Bilateral evidence of subacromial impingement with bursitis   I personally reviewed and interpreted the radiographs.   Assessment and Plan:   73 y.o. female with evidence of bilateral rotator cuff impingement.  She does have a pending lumbar spine surgery pending and I have asked that she also investigate her cervical spine when she reconvene's with her spine surgeon.  I do believe that some of the symptoms may be emanating from the cervical spine.  That being said I did recommend beginning with an ultrasound-guided injection of the subacromial space on the right side for diagnostic purposes I will plan to see her back in 2 weeks we will consider the left at that time.   -Right shoulder ultrasound-guided injection provided after verbal consent obtained    Procedure Note  Patient: Tiffany Bender             Date of Birth: 12-26-1950           MRN: [id number]             Visit Date: 02/01/2024  Procedures: Visit Diagnoses: No diagnosis found.  Large Joint Inj: R subacromial bursa on 02/01/2024 12:45 PM Indications: pain Details: 22 G 1.5 in needle, ultrasound-guided anterior approach  Arthrogram: No  Medications: 4 mL lidocaine 1 %; 80 mg triamcinolone  acetonide 40 MG/ML Outcome: tolerated well, no immediate complications Procedure, treatment alternatives, risks and benefits  explained, specific risks discussed. Consent was given by the patient. Immediately prior to procedure a time out was called to verify the correct patient, procedure, equipment, support staff and site/side marked as required. Patient was prepped and draped in the usual sterile fashion.         I personally saw and evaluated the patient, and participated in the management and treatment plan.  Wilhelmenia Harada, MD Attending Physician, Orthopedic Surgery  This document was dictated using Dragon voice recognition software. A reasonable attempt at proof reading has been made to minimize errors.

## 2024-02-07 DIAGNOSIS — M4726 Other spondylosis with radiculopathy, lumbar region: Secondary | ICD-10-CM | POA: Diagnosis not present

## 2024-02-07 DIAGNOSIS — M48062 Spinal stenosis, lumbar region with neurogenic claudication: Secondary | ICD-10-CM | POA: Diagnosis not present

## 2024-02-07 DIAGNOSIS — M5432 Sciatica, left side: Secondary | ICD-10-CM | POA: Diagnosis not present

## 2024-02-07 DIAGNOSIS — M50122 Cervical disc disorder at C5-C6 level with radiculopathy: Secondary | ICD-10-CM | POA: Diagnosis not present

## 2024-02-07 DIAGNOSIS — M50123 Cervical disc disorder at C6-C7 level with radiculopathy: Secondary | ICD-10-CM | POA: Diagnosis not present

## 2024-02-07 DIAGNOSIS — M5431 Sciatica, right side: Secondary | ICD-10-CM | POA: Diagnosis not present

## 2024-02-07 DIAGNOSIS — M4802 Spinal stenosis, cervical region: Secondary | ICD-10-CM | POA: Diagnosis not present

## 2024-02-07 DIAGNOSIS — M963 Postlaminectomy kyphosis: Secondary | ICD-10-CM | POA: Diagnosis not present

## 2024-02-17 ENCOUNTER — Ambulatory Visit (HOSPITAL_BASED_OUTPATIENT_CLINIC_OR_DEPARTMENT_OTHER): Admitting: Orthopaedic Surgery

## 2024-02-22 ENCOUNTER — Ambulatory Visit (HOSPITAL_BASED_OUTPATIENT_CLINIC_OR_DEPARTMENT_OTHER): Admitting: Orthopaedic Surgery

## 2024-02-28 DIAGNOSIS — M4802 Spinal stenosis, cervical region: Secondary | ICD-10-CM | POA: Diagnosis not present

## 2024-02-28 DIAGNOSIS — M50323 Other cervical disc degeneration at C6-C7 level: Secondary | ICD-10-CM | POA: Diagnosis not present

## 2024-02-28 DIAGNOSIS — M47812 Spondylosis without myelopathy or radiculopathy, cervical region: Secondary | ICD-10-CM | POA: Diagnosis not present

## 2024-02-28 DIAGNOSIS — M50322 Other cervical disc degeneration at C5-C6 level: Secondary | ICD-10-CM | POA: Diagnosis not present

## 2024-03-01 DIAGNOSIS — M79645 Pain in left finger(s): Secondary | ICD-10-CM | POA: Diagnosis not present

## 2024-03-01 DIAGNOSIS — M4802 Spinal stenosis, cervical region: Secondary | ICD-10-CM | POA: Diagnosis not present

## 2024-03-01 DIAGNOSIS — M79644 Pain in right finger(s): Secondary | ICD-10-CM | POA: Diagnosis not present

## 2024-03-01 DIAGNOSIS — M4726 Other spondylosis with radiculopathy, lumbar region: Secondary | ICD-10-CM | POA: Diagnosis not present

## 2024-03-01 DIAGNOSIS — M5432 Sciatica, left side: Secondary | ICD-10-CM | POA: Diagnosis not present

## 2024-03-01 DIAGNOSIS — M50122 Cervical disc disorder at C5-C6 level with radiculopathy: Secondary | ICD-10-CM | POA: Diagnosis not present

## 2024-03-01 DIAGNOSIS — M48062 Spinal stenosis, lumbar region with neurogenic claudication: Secondary | ICD-10-CM | POA: Diagnosis not present

## 2024-03-01 DIAGNOSIS — M963 Postlaminectomy kyphosis: Secondary | ICD-10-CM | POA: Diagnosis not present

## 2024-03-01 DIAGNOSIS — M5431 Sciatica, right side: Secondary | ICD-10-CM | POA: Diagnosis not present

## 2024-03-01 DIAGNOSIS — M50123 Cervical disc disorder at C6-C7 level with radiculopathy: Secondary | ICD-10-CM | POA: Diagnosis not present

## 2024-03-01 DIAGNOSIS — M180 Bilateral primary osteoarthritis of first carpometacarpal joints: Secondary | ICD-10-CM | POA: Diagnosis not present

## 2024-03-02 ENCOUNTER — Telehealth (HOSPITAL_BASED_OUTPATIENT_CLINIC_OR_DEPARTMENT_OTHER): Payer: Self-pay | Admitting: Orthopaedic Surgery

## 2024-03-02 NOTE — Telephone Encounter (Signed)
 Pt is requesting referral for knee reconstruction. Please advise.

## 2024-03-02 NOTE — Telephone Encounter (Signed)
 Patient states has questions about her shoulders and everything that is going on with her

## 2024-03-02 NOTE — Telephone Encounter (Signed)
 Copied from CRM [id number]. Topic: Clinical - Medical Advice >> Mar 02, 2024 12:02 PM Tiffany Bender wrote: Reason for CRM: Patient had knee reconstruction and is having issues. Patient requested that Dr.Rucker give her a call back if she can recommend a good knee doctor. Please call back ASAP.

## 2024-03-06 ENCOUNTER — Ambulatory Visit: Payer: Self-pay

## 2024-03-06 NOTE — Telephone Encounter (Signed)
 FYI Only or Action Required?: FYI only for provider.  Patient was last seen in primary care on 01/12/2024 by Colette Torrence GRADE, MD.  Called Nurse Triage reporting Knee Pain and Back Pain.  Symptoms began several weeks ago.  Interventions attempted: OTC medications: NSAID's.  Symptoms are: unchanged.  Triage Disposition: See PCP When Office is Open (Within 3 Days)  Patient/caregiver understands and will follow disposition?: Yes   Patient interested in advice for knee pain as well as alternatives for spine surgery.  She is under MD/surgeon care for spine and knee Reason for Disposition  [1] Swollen joint AND [2] no fever or redness  Answer Assessment - Initial Assessment Questions 1. LOCATION and RADIATION: Where is the pain located?    Right  knee 2. QUALITY: What does the pain feel like?  (e.g., sharp, dull, aching, burning)     Stiff and swollen, limited ROM/flexion  4. ONSET: When did the pain start? Does it come and go, or is it there all the time?     Flared up two weeks ago, Sunday 5. RECURRENT: Have you had this pain before? If Yes, ask: When, and what happened then?     Knee Surgery 2004 6. SETTING: Has there been any recent work, exercise or other activity that involved that part of the body?      N/a 7. AGGRAVATING FACTORS: What makes the knee pain worse? (e.g., walking, climbing stairs, running)     Kneeling at church 8. ASSOCIATED SYMPTOMS: Is there any swelling or redness of the knee?     swelling 9. OTHER SYMPTOMS: Do you have any other symptoms? (e.g., calf pain, chest pain, difficulty breathing, fever)     denies  Protocols used: Knee Pain-A-AH

## 2024-03-07 ENCOUNTER — Ambulatory Visit (HOSPITAL_BASED_OUTPATIENT_CLINIC_OR_DEPARTMENT_OTHER): Admitting: Orthopaedic Surgery

## 2024-03-08 ENCOUNTER — Encounter: Payer: Self-pay | Admitting: Family Medicine

## 2024-03-08 ENCOUNTER — Ambulatory Visit (INDEPENDENT_AMBULATORY_CARE_PROVIDER_SITE_OTHER): Admitting: Family Medicine

## 2024-03-08 ENCOUNTER — Ambulatory Visit: Admitting: Family Medicine

## 2024-03-08 VITALS — BP 146/82 | HR 72 | Temp 98.0°F | Resp 18 | Ht 63.0 in | Wt 140.0 lb

## 2024-03-08 DIAGNOSIS — G8929 Other chronic pain: Secondary | ICD-10-CM | POA: Diagnosis not present

## 2024-03-08 DIAGNOSIS — M25561 Pain in right knee: Secondary | ICD-10-CM | POA: Diagnosis not present

## 2024-03-08 NOTE — Progress Notes (Signed)
 Acute Office Visit  Subjective:     Patient ID: Tiffany Bender, female    DOB: [birth year], 73 y.o.   MRN: [id number]  Chief Complaint  Patient presents with   Knee Pain    Patient states that she has been having on going right knee pain for about 3 1/2 weeks. She states that she had bended down at church and heard a pop and every since then she has been having pain, her right knee along with her right leg appears to be swollen, patient is currently taking advil for the knee pain .     Knee Pain   Patient is in today for acute visit.  She reports for about a year, her right knee 'didn't feel normal'. She saw a knee specialist last year with [facility] and saw someone in the office for her knee. She has hx of complete ACL reconstruction in 2004 and knee felt fine until last year. She wasn't happy about her previous knee provider so she made an appt with Dr Genelle and has appt in August. She is taking Advil for the pain.    Review of Systems  Musculoskeletal:  Positive for joint pain.       Knee pain  All other systems reviewed and are negative.       Objective:    BP (!) 146/82   Pulse 72   Temp 98 F (36.7 C) (Oral)   Resp 18   Ht 5' 3 (1.6 m)   Wt 140 lb 0.2 oz (63.5 kg)   SpO2 98%   BMI 24.80 kg/m  BP Readings from Last 3 Encounters:  03/08/24 (!) 146/82  01/12/24 124/68  11/11/23 123/65      Physical Exam Vitals and nursing note reviewed.  Constitutional:      Appearance: Normal appearance. She is normal weight.  HENT:     Head: Normocephalic and atraumatic.     Right Ear: External ear normal.     Left Ear: External ear normal.     Nose: Nose normal.     Mouth/Throat:     Mouth: Mucous membranes are moist.     Pharynx: Oropharynx is clear.  Eyes:     Conjunctiva/sclera: Conjunctivae normal.     Pupils: Pupils are equal, round, and reactive to light.  Cardiovascular:     Rate and Rhythm: Normal rate.  Pulmonary:      Effort: Pulmonary effort is normal.  Skin:    General: Skin is warm.     Capillary Refill: Capillary refill takes less than 2 seconds.  Neurological:     General: No focal deficit present.     Mental Status: She is alert and oriented to person, place, and time. Mental status is at baseline.  Psychiatric:        Mood and Affect: Mood normal.        Behavior: Behavior normal.        Thought Content: Thought content normal.        Judgment: Judgment normal.    No results found for any visits on 03/08/24.      Assessment & Plan:   Problem List Items Addressed This Visit   None   No orders of the defined types were placed in this encounter.  Chronic pain of right knee   Pt to see Ortho for further evaluation and discussion on her knee.   No follow-ups on file.  Torrence CINDERELLA Barrier, MD  Total time spent with patient today 27 minutes. This includes reviewing records, evaluating the patient and coordinating care. Face-to-face time >50%.

## 2024-03-13 NOTE — Telephone Encounter (Signed)
 03/13/2024-Left message on patients voicemail  on 03/06/2024 and 03/13/2024 to contact office to schedule appointment with Dr. Colette to discuss knee reconstruction surgery and concerns.

## 2024-03-14 ENCOUNTER — Telehealth (HOSPITAL_BASED_OUTPATIENT_CLINIC_OR_DEPARTMENT_OTHER): Payer: Self-pay | Admitting: Orthopaedic Surgery

## 2024-03-14 ENCOUNTER — Telehealth: Payer: Self-pay

## 2024-03-14 NOTE — Telephone Encounter (Signed)
 Spoke with patient. Advised her Dr. KATHEE does not do total knee replacements and that he usually sends them over to either Dr. Jerri or Dr. Vernetta. Stated understood and that she wanted to keep her appt to still see Dr. KATHEE.

## 2024-03-14 NOTE — Telephone Encounter (Signed)
 Patient states that her primary care wants to know if Dr B does total knee reconstruction patient does not have my chart. [phone]

## 2024-03-14 NOTE — Telephone Encounter (Signed)
 Copied from CRM [id number]. Topic: General - Other >> Mar 14, 2024  9:56 AM Jasmin G wrote: Reason for CRM: Pt. States that she received a voicemail yesterday about her needing to schedule an appt for her knee problems in order to get a referral, but she said she had already come in to the clinic for an appt regarding this matter on the 17th at 3:50 p.m and was told by the doctor to wait an make an appt with another doctor. Please call pt back for clarification. It's okay to leave voicemail. Also she would like to be informed about clinic's specific scheduling procedures.

## 2024-03-28 ENCOUNTER — Ambulatory Visit (HOSPITAL_BASED_OUTPATIENT_CLINIC_OR_DEPARTMENT_OTHER): Admitting: Orthopaedic Surgery

## 2024-03-28 ENCOUNTER — Other Ambulatory Visit (HOSPITAL_BASED_OUTPATIENT_CLINIC_OR_DEPARTMENT_OTHER): Payer: Self-pay

## 2024-03-28 ENCOUNTER — Ambulatory Visit (HOSPITAL_BASED_OUTPATIENT_CLINIC_OR_DEPARTMENT_OTHER)

## 2024-03-28 DIAGNOSIS — M25561 Pain in right knee: Secondary | ICD-10-CM | POA: Diagnosis not present

## 2024-03-28 DIAGNOSIS — G8929 Other chronic pain: Secondary | ICD-10-CM

## 2024-03-28 DIAGNOSIS — M25461 Effusion, right knee: Secondary | ICD-10-CM | POA: Diagnosis not present

## 2024-03-28 DIAGNOSIS — M1711 Unilateral primary osteoarthritis, right knee: Secondary | ICD-10-CM | POA: Diagnosis not present

## 2024-03-28 DIAGNOSIS — M25511 Pain in right shoulder: Secondary | ICD-10-CM

## 2024-03-28 MED ORDER — TRIAMCINOLONE ACETONIDE 40 MG/ML IJ SUSP
80.0000 mg | INTRAMUSCULAR | Status: AC | PRN
Start: 2024-03-28 — End: 2024-03-28
  Administered 2024-03-28: 80 mg via INTRA_ARTICULAR

## 2024-03-28 MED ORDER — LIDOCAINE HCL 1 % IJ SOLN
4.0000 mL | INTRAMUSCULAR | Status: AC | PRN
Start: 2024-03-28 — End: 2024-03-28
  Administered 2024-03-28: 4 mL

## 2024-03-28 NOTE — Progress Notes (Signed)
 Chief Complaint: Right knee pain     History of Present Illness:   03/28/2024: Presents today for follow-up of her right knee.  She has been having significant pain and swelling in this.  She does have a history of ACL reconstruction done in 2003 for the right knee  Capria Cartaya is a 73 y.o. female presents today with ongoing left shoulder pain which she has had for the last several years.  She did unfortunately require a lumbar surgery for which she states her shoulder was feeling better but unfortunately as she has been more active she is experiencing recurrent shoulder pain.  She enjoys gardening and working outside.  She has pain with overhead motion.  She does have a previous MRI from several years prior which did show evidence of rotator cuff impingement    PMH/PSH/Family History/Social History/Meds/Allergies:    Past Medical History:  Diagnosis Date  . C. difficile diarrhea   . DDD (degenerative disc disease), lumbar   . Dyspareunia in female 08/21/2015  . Fatty liver   . Hyperlipidemia   . KNEE PAIN, RIGHT 07/20/2006   Qualifier: Diagnosis of  By: Alvan MD, Dorothyann    . LIVER FUNCTION TESTS, ABNORMAL 09/28/2007   Qualifier: Diagnosis of  By: Vicci LPN, Kim    . Neck pain   . Osteopenia   . Other and unspecified ovarian cysts    history  . Vaginal atrophy 09/02/2021   Past Surgical History:  Procedure Laterality Date  . ARTHROSCOPIC REPAIR ACL     RT knee  . BUNIONECTOMY  08/23/1992   bilateral  . CARPAL TUNNEL RELEASE Right   . CARPAL TUNNEL RELEASE  08/23/1998   Left   . OVARIAN CYST REMOVAL  08/23/1986   left   . POSTERIOR LUMBAR LAMINECTOMY L4-S1 AND FUSION WITH INSTRUMENTATION, L4-L5 TRANSFORAMINAL LUMBAR INTERBODY FUSION AND BONE   04/20/2021  . TONSILLECTOMY     right  . TUBAL LIGATION    . VEIN LIGATION AND STRIPPING  08/24/1979   Social History   Socioeconomic History  . Marital status: Married    Spouse name: RV  . Number of  children: 4  . Years of education: Not on file  . Highest education level: Not on file  Occupational History  . Occupation: Homemaker   Tobacco Use  . Smoking status: Former    Current packs/day: 0.00    Types: Cigarettes    Quit date: 08/24/1983    Years since quitting: 40.6    Passive exposure: Never  . Smokeless tobacco: Never  Vaping Use  . Vaping status: Never Used  Substance and Sexual Activity  . Alcohol use: Yes    Comment: socially  . Drug use: No  . Sexual activity: Yes    Partners: Male    Comment: Married; adopted, currently unemployed, college education, 2 children, caffeine 2-3 day, former huwsband abusinve  Other Topics Concern  . 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 She was adopted at age 79. She does drink coffee daily. She has 2 biological children and 2 adopted children.   Social Drivers of Corporate investment banker Strain: Low Risk  (01/19/2024)   Received from [facility]   Overall Financial Resource Strain (CARDIA)   . Difficulty of Paying Living Expenses: Not hard at all  Food Insecurity: No Food Insecurity (01/19/2024)   Received from [facility]  Hunger Vital Sign   . Within the past 12 months, you worried that your food would run out before you got the money to buy more.: Never true   . Within the past 12 months, the food you bought just didn't last and you didn't have money to get more.: Never true  Transportation Needs: No Transportation Needs (01/19/2024)   Received from [facility] - Transportation   . Lack of Transportation (Medical): No   . Lack of Transportation (Non-Medical): No  Physical Activity: Sufficiently Active (05/02/2023)   Exercise Vital Sign   . Days of Exercise per Week: 5 days   . Minutes of Exercise per Session: 30 min  Stress: No Stress Concern Present (05/02/2023)   [employer] of Occupational Health - Occupational Stress  Questionnaire   . Feeling of Stress : Not at all  Social Connections: Moderately Integrated (05/04/2023)   Social Connection and Isolation Panel   . Frequency of Communication with Friends and Family: Once a week   . Frequency of Social Gatherings with Friends and Family: Once a week   . Attends Religious Services: More than 4 times per year   . Active Member of Clubs or Organizations: Yes   . Attends Banker Meetings: More than 4 times per year   . Marital Status: Married   Family History  Adopted: Yes  Problem Relation Age of Onset  . Alcohol abuse Father    Allergies  Allergen Reactions  . Ciprofloxacin  Other (See Comments) and Swelling    Pelvic swelling Other reaction(s): Other Swelling genitalia, high fever. Went to ED    . Fenofibrate Other (See Comments)  . Morphine Rash  . Penicillins Other (See Comments)    Head ache. rash Headaches.    . Statins Other (See Comments)    Dark brown urine.  Dark unrine Other reaction(s): Other Dark brown urine.  Urine got really dark Has tried Crestor and Lipitor   . Sulfamethoxazole -Trimethoprim Other (See Comments)    Passed out, Eyes Swollen  . Tizanidine Hcl Other (See Comments)    Dizzy, weak, throat swelling   . Amoxicillin     headache  . Dexamethasone  Nausea Only    dizzy  . Latex Itching and Rash   Current Outpatient Medications  Medication Sig Dispense Refill  . ascorbic acid (VITAMIN C) 500 MG tablet Take 1,000 mg by mouth daily.    SABRA aspirin EC 81 MG tablet Take 81 mg by mouth daily. Swallow whole.    SABRA BORON PO Take 6 mg by mouth.    . Cholecalciferol 125 MCG (5000 UT) TABS Take 2,000 Units by mouth.    . Cranberry 405 MG CAPS 870 mg.    . hydrocortisone  1 % ointment Apply 1 application topically 2 (two) times daily as needed for itching. 30 g 1  . nitrofurantoin , macrocrystal-monohydrate, (MACROBID ) 100 MG capsule Take 1 capsule (100 mg total) by mouth 2 (two) times daily. 10 capsule 0  .  Probiotic TBEC Take 250 mg by mouth See admin instructions.     No current facility-administered medications for this visit.   No results found.  Review of Systems:   A ROS was performed including pertinent positives and negatives as documented in the HPI.  Physical Exam :   Constitutional: NAD and appears stated age Neurological: Alert and oriented Psych: Appropriate affect and cooperative There were no vitals taken for this visit.   Comprehensive Musculoskeletal Exam:    Pain with  overhead range of motion.  Active range of motion is 160 compared to 170 on the contralateral side.  External rotation of the side is to 40 equal to contralateral.  Internal rotation is to L1 bilaterally positive Neer impingement  Right knee with a diffuse effusion.  Range of motion is from 0 to 100 degrees.  Negative Lachman.  Previous incisions are well-healed   Imaging:   Xray (3 views left shoulder, 4 views right knee): Normal, evidence of pseudogout right knee without osteoarthritis  MRI right shoulder, MRI left shoulder: Bilateral evidence of subacromial impingement with bursitis   I personally reviewed and interpreted the radiographs.   Assessment and Plan:   73 y.o. female with right knee pain consistent with pseudogout status post previous ACL reconstruction.  At this time she does have quite a significant effusion to that effect I have recommended aspiration with injection.  -Right knee aspiration with injection provided after verbal consent obtained    Procedure Note  Patient: Tiffany Bender             Date of Birth: February 25, 1951           MRN: [id number]             Visit Date: 03/28/2024  Procedures: Visit Diagnoses:  1. Chronic pain of right knee   2. Acute pain of right shoulder     Large Joint Inj: R knee on 03/28/2024 12:35 PM Indications: pain Details: 22 G 1.5 in needle, ultrasound-guided anterior approach  Arthrogram: No  Medications: 4 mL lidocaine  1 %; 80 mg  triamcinolone  acetonide 40 MG/ML Outcome: tolerated well, no immediate complications Procedure, treatment alternatives, risks and benefits explained, specific risks discussed. Consent was given by the patient. Immediately prior to procedure a time out was called to verify the correct patient, procedure, equipment, support staff and site/side marked as required. Patient was prepped and draped in the usual sterile fashion.         I personally saw and evaluated the patient, and participated in the management and treatment plan.  Elspeth Parker, MD Attending Physician, Orthopedic Surgery  This document was dictated using Dragon voice recognition software. A reasonable attempt at proof reading has been made to minimize errors.

## 2024-03-30 DIAGNOSIS — M47816 Spondylosis without myelopathy or radiculopathy, lumbar region: Secondary | ICD-10-CM | POA: Diagnosis not present

## 2024-04-20 ENCOUNTER — Ambulatory Visit: Admitting: Family Medicine

## 2024-05-04 ENCOUNTER — Ambulatory Visit: Payer: Medicare Other | Admitting: Family Medicine

## 2024-05-07 ENCOUNTER — Ambulatory Visit (INDEPENDENT_AMBULATORY_CARE_PROVIDER_SITE_OTHER): Admitting: Family Medicine

## 2024-05-07 ENCOUNTER — Encounter: Payer: Self-pay | Admitting: Family Medicine

## 2024-05-07 VITALS — BP 120/74 | HR 78 | Temp 97.6°F | Resp 18 | Ht 63.0 in | Wt 136.2 lb

## 2024-05-07 DIAGNOSIS — Z0000 Encounter for general adult medical examination without abnormal findings: Secondary | ICD-10-CM

## 2024-05-07 NOTE — Patient Instructions (Signed)
  Ms. Mctigue , Thank you for taking time to come for your Medicare Wellness Visit. I appreciate your ongoing commitment to your health goals. Please review the following plan we discussed and let me know if I can assist you in the future.   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

## 2024-05-07 NOTE — Progress Notes (Signed)
 Subjective:   Tiffany Bender is a 73 y.o. female who presents for Medicare Annual (Subsequent) preventive examination.  Visit Complete: In person  Patient Medicare AWV questionnaire was completed by the patient on 05/07/24; I have confirmed that all information answered by patient is correct and no changes since this date.  Cardiac Risk Factors include: advanced age (>89men, >55 women)     Objective:    Today's Vitals   05/07/24 0913 05/07/24 0914  BP: 120/74   Pulse: 78   Resp: 18   Temp: 97.6 F (36.4 C)   TempSrc: Oral   SpO2: 99%   Weight: 136 lb 3.2 oz (61.8 kg)   Height: 5' 3 (1.6 m)   PainSc:  6    Body mass index is 24.13 kg/m.     11/11/2023   10:19 AM 05/04/2023    9:16 AM  Advanced Directives  Does Patient Have a Medical Advance Directive? Yes Yes  Type of Advance Directive Living will;Healthcare Power of [employer] Power of [place];Living will  Does patient want to make changes to medical advance directive?  No - Patient declined  Copy of Healthcare Power of Attorney in Chart?  No - copy requested    Current Medications (verified) Outpatient Encounter Medications as of 05/07/2024  Medication Sig   ascorbic acid (VITAMIN C) 500 MG tablet Take 1,000 mg by mouth daily.   aspirin EC 81 MG tablet Take 81 mg by mouth daily. Swallow whole.   BORON PO Take 6 mg by mouth.   Cholecalciferol 125 MCG (5000 UT) TABS Take 2,000 Units by mouth.   Cranberry 405 MG CAPS 870 mg.   hydrocortisone  1 % ointment Apply 1 application topically 2 (two) times daily as needed for itching.   Probiotic TBEC Take 250 mg by mouth See admin instructions.   [DISCONTINUED] nitrofurantoin , macrocrystal-monohydrate, (MACROBID ) 100 MG capsule Take 1 capsule (100 mg total) by mouth 2 (two) times daily.   No facility-administered encounter medications on file as of 05/07/2024.    Allergies (verified) Ciprofloxacin , Fenofibrate, Morphine, Penicillins, Statins,  Sulfamethoxazole -trimethoprim, Tizanidine hcl, Amoxicillin, Dexamethasone , and Latex   History: Past Medical History:  Diagnosis Date   C. difficile diarrhea    DDD (degenerative disc disease), lumbar    Dyspareunia in female 08/21/2015   Fatty liver    Hyperlipidemia    KNEE PAIN, RIGHT 07/20/2006   Qualifier: Diagnosis of  By: Alvan MD, Catherine     LIVER FUNCTION TESTS, ABNORMAL 09/28/2007   Qualifier: Diagnosis of  By: Vicci LPN, Kim     Neck pain    Osteopenia    Other and unspecified ovarian cysts    history   Vaginal atrophy 09/02/2021   Past Surgical History:  Procedure Laterality Date   ARTHROSCOPIC REPAIR ACL     RT knee   BUNIONECTOMY  08/23/1992   bilateral   CARPAL TUNNEL RELEASE Right    CARPAL TUNNEL RELEASE  08/23/1998   Left    OVARIAN CYST REMOVAL  08/23/1986   left    POSTERIOR LUMBAR LAMINECTOMY L4-S1 AND FUSION WITH INSTRUMENTATION, L4-L5 TRANSFORAMINAL LUMBAR INTERBODY FUSION AND BONE   04/20/2021   TONSILLECTOMY     right   TUBAL LIGATION     VEIN LIGATION AND STRIPPING  08/24/1979   Family History  Adopted: Yes  Problem Relation Age of Onset   Alcohol abuse Father    Social History   Socioeconomic History   Marital status: Married    Spouse  name: RV   Number of children: 4   Years of education: Not on file   Highest education level: Not on file  Occupational History   Occupation: Homemaker   Tobacco Use   Smoking status: Former    Current packs/day: 0.00    Types: Cigarettes    Quit date: 08/24/1983    Years since quitting: 40.7    Passive exposure: Never   Smokeless tobacco: Never  Vaping Use   Vaping status: Never Used  Substance and Sexual Activity   Alcohol use: Yes    Comment: socially   Drug use: No   Sexual activity: Yes    Partners: Male    Comment: Married; adopted, currently unemployed, college education, 2 children, caffeine 2-3 day, former huwsband abusinve  Other Topics Concern   Not on file  Social  History Narrative   No regular exercise. 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 She was adopted at age 24. She does drink coffee daily. She has 2 biological children and 2 adopted children.   Social Drivers of Corporate investment banker Strain: Low Risk  (01/19/2024)   Received from [facility]   Overall Financial Resource Strain (CARDIA)    Difficulty of Paying Living Expenses: Not hard at all  Food Insecurity: No Food Insecurity (01/19/2024)   Received from [facility]   Hunger Vital Sign    Within the past 12 months, you worried that your food would run out before you got the money to buy more.: Never true    Within the past 12 months, the food you bought just didn't last and you didn't have money to get more.: Never true  Transportation Needs: No Transportation Needs (01/19/2024)   Received from [facility] - Transportation    Lack of Transportation (Medical): No    Lack of Transportation (Non-Medical): No  Physical Activity: Sufficiently Active (05/02/2023)   Exercise Vital Sign    Days of Exercise per Week: 5 days    Minutes of Exercise per Session: 30 min  Stress: No Stress Concern Present (05/02/2023)   [employer] of Occupational Health - Occupational Stress Questionnaire    Feeling of Stress : Not at all  Social Connections: Moderately Integrated (05/04/2023)   Social Connection and Isolation Panel    Frequency of Communication with Friends and Family: Once a week    Frequency of Social Gatherings with Friends and Family: Once a week    Attends Religious Services: More than 4 times per year    Active Member of [employer] or Organizations: Yes    Attends Engineer, structural: More than 4 times per year    Marital Status: Married    Tobacco Counseling Counseling given: Not Answered   Clinical Intake:  Pre-visit preparation completed: No  Pain : 0-10 Pain Score: 6  Pain Type: Chronic pain Pain Location: Shoulder Pain  Orientation: Right, Left Pain Onset: More than a month ago Pain Frequency: Intermittent     BMI - recorded: 24 Nutritional Status: BMI of 19-24  Normal Nutritional Risks: None Diabetes: No  How often do you need to have someone help you when you read instructions, pamphlets, or other written materials from your doctor or pharmacy?: 1 - Never What is the last grade level you completed in school?: 2 Associate degrees  Interpreter Needed?: No      Activities of Daily Living    05/07/2024    9:17 AM  In your present  state of health, do you have any difficulty performing the following activities:  Hearing? 0  Vision? 0  Difficulty concentrating or making decisions? 0  Walking or climbing stairs? 0  Dressing or bathing? 0  Doing errands, shopping? 0  Preparing Food and eating ? N  Using the Toilet? N  In the past six months, have you accidently leaked urine? N  Do you have problems with loss of bowel control? N  Managing your Medications? N  Managing your Finances? N  Housekeeping or managing your Housekeeping? N    Patient Care Team: Colette Torrence GRADE, MD as PCP - General (Family Medicine)  Indicate any recent Medical Services you may have received from other than [facility] providers in the past year (date may be approximate).     Assessment:   This is a routine wellness examination for Tiffany Bender.  Hearing/Vision screen No results found.   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 0 0 0 0 0  Number falls in past yr: 0 0 0 0 0  Injury with Fall? 0 0 0 0 0  Risk for fall due to : No Fall Risks No Fall Risks No Fall Risks No Fall Risks No Fall Risks  Follow up Falls evaluation completed Falls evaluation completed Falls evaluation completed Falls evaluation completed Falls evaluation completed    MEDICARE RISK AT HOME: Medicare Risk at Home Any stairs in or around the home?: Yes If so, are there any without handrails?: Yes Home free of loose throw rugs in walkways, pet beds, electrical cords, etc?: No Adequate lighting in your home to reduce risk of falls?: Yes Life alert?: No Use of a cane, walker or w/c?: No Grab bars in the bathroom?: No Shower chair or bench in shower?: Yes Elevated toilet seat or a handicapped toilet?: No  TIMED UP AND GO:  Was the test performed?  Yes  Length of time to ambulate 10 feet: 12 sec Gait steady and fast without use of assistive device    Cognitive Function:        05/07/2024    9:19 AM 05/02/2023    9:15 AM 11/05/2021    2:19 PM  6CIT Screen  What Year? 0 points 0 points 0 points  What month? 0 points 0 points 0 points  What time? 0 points 0 points 0 points  Count back from 20 0 points 0 points 0 points  Months in reverse 0 points 0 points 0 points  Repeat phrase 10 points  0 points  Total Score 10 points  0 points    Immunizations  Immunization History  Administered Date(s) Administered   Influenza Split 07/29/2011, 08/11/2012   Influenza Whole 06/23/2005, 06/26/2007   Influenza-Unspecified 06/08/2013   PNEUMOCOCCAL CONJUGATE-20 04/28/2022   Td 06/23/2004   Tdap 05/19/2012    TDAP status: Due, Education has been provided regarding the importance of this vaccine. Advised may receive this vaccine at local pharmacy or Health Dept. Aware to provide a copy of the vaccination record if obtained from local pharmacy or Health Dept. Verbalized acceptance and understanding.  Flu Vaccine status: Declined,  Education has been provided regarding the importance of this vaccine but patient still declined. Advised may receive this vaccine at local pharmacy or Health Dept. Aware to provide a copy of the vaccination record if obtained from local pharmacy or Health Dept. Verbalized acceptance and understanding.  Pneumococcal vaccine status: Declined,  Education has been provided regarding the importance of this vaccine but patient still declined. Advised may receive this vaccine at local pharmacy or Health Dept. Aware to provide a copy of the vaccination record if obtained from local pharmacy or Health Dept. Verbalized acceptance and understanding.   Covid-19 vaccine status: Declined, Education has been provided regarding the importance of this vaccine but patient still declined. Advised may receive this vaccine at local pharmacy or Health Dept.or vaccine clinic. Aware to provide a copy of the vaccination record if obtained from local pharmacy or Health Dept. Verbalized acceptance and understanding.  Qualifies for Shingles Vaccine? Yes   Zostavax completed No   Shingrix  Completed?: No.    Education has been provided regarding the importance of this vaccine. Patient has been advised to call insurance company to determine out of pocket expense if they have not yet received this vaccine. Advised may also receive vaccine at local pharmacy or Health Dept. Verbalized acceptance and understanding.  Screening Tests Health Maintenance  Topic Date Due   Medicare Annual Wellness (AWV)  05/07/2025   Colonoscopy  07/23/2025   Mammogram  01/24/2026   DEXA SCAN  01/05/2027   Pneumococcal Vaccine: 50+ Years  Completed   Hepatitis C Screening  Completed   HPV VACCINES  Aged Out   Meningococcal B Vaccine  Aged Out   DTaP/Tdap/Td  Discontinued   Influenza Vaccine  Discontinued   COVID-19 Vaccine  Discontinued   Zoster Vaccines- Shingrix   Discontinued    Health Maintenance  There are no preventive care reminders to  display for this patient.  Colorectal cancer screening: Type of screening: Colonoscopy. Completed 07/24/2015. Repeat every 10 years  Mammogram status: Completed 01/25/24. Repeat every year  Bone Density status: Completed 01/04/2022. Results reflect: Bone density results: OSTEOPENIA. Repeat every 5 years.  Lung Cancer Screening: (Low Dose CT Chest recommended if Age 25-80 years, 20 pack-year currently smoking OR have quit w/in 15years.) does not qualify.   Lung Cancer Screening Referral: N/A  Additional Screening:  Hepatitis C Screening: does not qualify; Completed N/A  Vision Screening: Recommended annual ophthalmology exams for early detection of glaucoma and other disorders of the eye. Is the patient up to date with their annual eye exam?  Yes  Who is the provider or what is the name of the office in which the patient attends annual eye exams? unknown If pt is not established with a provider, would they like to be referred to a provider to establish care? No .   Dental Screening: Recommended annual dental exams for proper oral hygiene  Diabetic Foot Exam: Not diabetic  Community Resource Referral / Chronic Care Management: CRR required this visit?  No   CCM required this visit?  No     Plan:     I have personally reviewed and noted the following in the patient's chart:   Medical and social history Use of alcohol, tobacco or illicit drugs  Current medications and supplements including opioid prescriptions. Patient is not currently taking opioid prescriptions. Functional ability and status Nutritional status Physical activity Advanced directives List of other physicians Hospitalizations, surgeries, and ER visits in previous 12 months Vitals Screenings to include cognitive, depression, and falls Referrals and appointments  In addition, I have reviewed and discussed with patient certain preventive protocols, quality metrics, and best practice recommendations. A written  personalized care plan for preventive services as well as general preventive health recommendations were provided to patient.     Torrence CINDERELLA Barrier, MD   05/07/2024   After Visit Summary: (In Person-Printed) AVS printed and given to the patient  Nurse Notes: none

## 2024-05-14 ENCOUNTER — Other Ambulatory Visit (HOSPITAL_BASED_OUTPATIENT_CLINIC_OR_DEPARTMENT_OTHER): Payer: Self-pay | Admitting: Orthopaedic Surgery

## 2024-05-14 ENCOUNTER — Telehealth (HOSPITAL_BASED_OUTPATIENT_CLINIC_OR_DEPARTMENT_OTHER): Payer: Self-pay | Admitting: Orthopaedic Surgery

## 2024-05-14 DIAGNOSIS — G8929 Other chronic pain: Secondary | ICD-10-CM

## 2024-05-14 NOTE — Telephone Encounter (Signed)
 Patient would to to be seen by rheumatologist because she feels she has symptoms that one of her friends has and she would like to just be check to see if she has this going on. Patient does not have mychart best contact [phone]

## 2024-05-14 NOTE — Telephone Encounter (Signed)
 Referral placed in chart

## 2024-05-17 ENCOUNTER — Ambulatory Visit: Admitting: Orthopedic Surgery

## 2024-05-17 ENCOUNTER — Other Ambulatory Visit: Payer: Self-pay

## 2024-05-17 ENCOUNTER — Other Ambulatory Visit (INDEPENDENT_AMBULATORY_CARE_PROVIDER_SITE_OTHER): Payer: Self-pay

## 2024-05-17 VITALS — BP 142/83 | HR 92 | Ht 63.0 in | Wt 138.8 lb

## 2024-05-17 DIAGNOSIS — M542 Cervicalgia: Secondary | ICD-10-CM

## 2024-05-17 DIAGNOSIS — M545 Low back pain, unspecified: Secondary | ICD-10-CM | POA: Diagnosis not present

## 2024-05-17 DIAGNOSIS — G8929 Other chronic pain: Secondary | ICD-10-CM

## 2024-05-17 MED ORDER — METHYLPREDNISOLONE 4 MG PO TBPK: ORAL_TABLET | ORAL | 0 refills | Status: AC

## 2024-05-17 NOTE — Progress Notes (Signed)
 Orthopedic Spine Surgery Office Note  Assessment: Patient is a 73 y.o. female with pain in her neck and bilateral shoulders.  Also having pain in the lower back.  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  I explained that there are really for ways to treat spine related pain.  There is physical therapy with home exercise programs, medications, injections, and surgical options.  She has done physical therapy and is doing home exercises which have not helped.  She does not want any long-term medications so I went over Medrol  Dosepak as an option.  She said injections helped but only for 2 weeks that she is not interested in those.  She feels that surgery should be a last option so she is not interested in that at this point.  Accordingly, we decided on a Medrol  Dosepak since that is something that she has not tried and meets her goals.  I did explain that if surgery is ever entertained for her neck, I would just recommend C5/6 since her pain does not radiate past the elbow, I do not think that C6/7 is involved.  I also did explain that a component of her pain seems to be coming from the shoulders based on her exam and the fact that injections did help her temporarily when given intra-articular in the shoulder. -Patient should return to office on as needed basis   Patient expressed understanding of the plan and all questions were answered to the patient's satisfaction.   ___________________________________________________________________________   History:  Patient is a 73 y.o. female who presents today for cervical and lumbar.  Patient has a history of lumbar spine surgery in  2022.  She had an L4-S1 spinal fusion with decompression.  This was done at outside institution.  She said she was doing well after that surgery until the last year.  She has now noticed pain in her lower back.  She does develop pain into the lateral thighs at times but not consistently.  Her more significant pain now is her pain in the lower part of her neck.  She also has pain in her bilateral shoulders.  She feels the pain with activity and at rest.  She has gotten injections with Dr. Genelle into the shoulders and noticed about 2 weeks of relief but then pain returned.  She has no pain radiating past the mid humerus on either side.   Weakness: Denies Difficulty with fine motor skills (e.g., buttoning shirts, handwriting): Denies Symptoms of imbalance: Denies Paresthesias and numbness: Denies Bowel or bladder incontinence: Yes, has had incontinence as a side effect of medication but no consistent urinary or or bowel incontinence. Saddle anesthesia: Denies  Treatments tried: PT, home exercise program, Tylenol, Advil, steroid injections  Review of systems: Denies fevers and chills, night sweats, unexplained weight loss, history of cancer.  Has had pain that wakes her at night  Past medical history: HLD History of DVT Chronic pain  Allergies: ciprofloxacin , fenofibrate, morphine, penicillin, statins, bactrim, tizanidine, amoxicillin, dexamethasone , latex  Past surgical history:  L4-S1 spinal fusion and decompression Right ACL reconstruction Bilateral bunionectomy Carpal tunnel release Tubal ligation Ovarian cyst removal  Social history: Denies use of nicotine  product (smoking, vaping, patches, smokeless) Alcohol use: denies Denies recreational drug use   Physical Exam:  BMI of 24.6  General: no acute distress, appears stated age Neurologic: alert, answering questions appropriately, following commands Respiratory: unlabored breathing on room air, symmetric chest  rise Psychiatric: appropriate affect, normal cadence to speech   MSK (spine):  -Strength exam      Left  Right Grip strength                5/5  5/5 Interosseus   5/5   5/5 Wrist extension  5/5  5/5 Wrist flexion   5/5  5/5 Elbow flexion   5/5  5/5 Deltoid    5/5  5/5  EHL    5/5  5/5 TA    5/5  5/5 GSC    5/5  5/5 Knee extension  5/5  5/5 Hip flexion   5/5  5/5  -Sensory exam    Sensation intact to light touch in L3-S1 nerve distributions of bilateral lower extremities  Sensation intact to light touch in C5-T1 nerve distributions of bilateral upper extremities  -Spurling: Negative bilaterally -Hoffman sign: Negative bilaterally -Clonus: No beats bilaterally -Interosseous wasting: None seen -Grip and release test: Negative  Left shoulder exam: Pain with range of motion and resisted abduction at the shoulder Right shoulder exam: Pain through range of motion and resisted abduction at the shoulder  Imaging: XRs of the cervical spine from 05/17/2024 were independently reviewed and interpreted, showing disc height loss at C5/6 and C6/7 with small anterior osteophyte formation.  No evidence of instability on flexion/extension views.  No fracture or dislocation seen.  Facet arthropathy seen at C6/7 and C7/T1.  XRs of the lumbar spine from 05/17/2024 were independently reviewed and interpreted, showing posterior spinal instrumentation from L4-S1.  There is no lucency seen around the screws.  There is an interbody device in the former L4/5 disc space.  No lucency seen around the interbody.  Laminectomy defect from L4-S1.  Disc height loss with small anterior osteophyte formation at L3/4.  No other significant degenerative changes seen.  No evidence of instability on flexion/extension views.  No fracture or dislocation seen.  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   Patient name: Tiffany Bender Patient  MRN: [id number] Date of visit: 05/17/24   I spent 35 minutes in the room with the patient and her husband going over both her lumbar and cervical symptoms.  I spent a good amount of time explaining that some of her pain does seem to be coming from the shoulders.  I also used this time to examine her.  We also spent time going through her MRI of the cervical spine on the CD.  A lot of time was spent on talking through options.

## 2024-05-21 ENCOUNTER — Telehealth: Payer: Self-pay | Admitting: Orthopedic Surgery

## 2024-05-21 NOTE — Telephone Encounter (Signed)
 The patient is almost finished with the medrol  dosepak for her back pain, 1 for tonight, 2 tomorrow and then the last one on the next day. She has had white sores develop in her mouth and has had some left-sided chest pain. The chest pain subsided, but still has the mouth sores. She states she would like to finish the medication, as her back is feeling much better and functioning better - I feel like a human being. However, she is asking if the chest pain returns, should she just stop the medication. Will the mouth sores go away? If she does finish the medication, what can she expect afterward, regarding her back pain and daily functioning?

## 2024-05-21 NOTE — Telephone Encounter (Signed)
 Pt called stating that she is having side effects form her medication and only has two days left of pills to take.The medication she is taking is Metahyltrednisolone.She has questions concerning what will happen when she stops taking this medication and her side effects hat she is having. Pt call back number is [phone].luck

## 2024-05-22 NOTE — Telephone Encounter (Signed)
 I advised the patient of Dr. Jeraline message. Advised her to give us  a call back if the original pain returns, or if she has any other concerns. She did say the chest pain has not recurred thus far, and that she is feeling better than before the medication.

## 2024-05-22 NOTE — Telephone Encounter (Signed)
Called patient left vm to call me back

## 2024-05-25 ENCOUNTER — Telehealth: Payer: Self-pay | Admitting: Orthopedic Surgery

## 2024-05-25 DIAGNOSIS — M542 Cervicalgia: Secondary | ICD-10-CM

## 2024-05-25 NOTE — Telephone Encounter (Signed)
 Patient called and said she is in pretty bad shape and she is where she started at the beginning. She needs something else for pain. Can you call her. [phone]

## 2024-05-26 MED ORDER — TRAMADOL HCL 50 MG PO TABS: 50.0000 mg | ORAL_TABLET | Freq: Four times a day (QID) | ORAL | 0 refills | Status: AC | PRN

## 2024-05-26 NOTE — Addendum Note (Signed)
 Addended by: GEORGINA SHARPER on: 05/26/2024 06:11 AM   Modules accepted: Orders

## 2024-05-28 NOTE — Telephone Encounter (Signed)
 I called and lmovm advising of Dr. Jeraline message

## 2024-06-01 NOTE — Progress Notes (Signed)
 Office Visit Note  Patient: Tiffany Bender             Date of Birth: 1951-07-10           MRN: [id number]             PCP: Colette Torrence GRADE, MD Referring: Genelle Standing, MD Visit Date: 06/04/2024 Occupation: @GUAROCC @  Subjective:  New Patient (Initial Visit) (Has had labs in over 2 years ), Joint Pain (Bilateral shoulders ), and Gout (right knee )  Discussed the use of AI scribe software for clinical note transcription with the patient, who gave verbal consent to proceed.  History of Present Illness Tiffany Bender is a 73 year old female with pseudogout and bilateral shoulder pain who presents with severe pain and inflammation. She was referred by Dr. Malinda for evaluation of her severe shoulder pain and pseudogout in her right knee.  She experiences severe bilateral shoulder pain radiating down her arms, with significant inflammation in the bursas. The pain is described as burning, particularly severe in the mornings, making it difficult to get out of bed, sit, or walk. She has tried Aleve and a supplement called Curamen Extra Strength Pain Relief, which have provided some relief. A previous Medrol  pack alleviated the pain temporarily but caused significant side effects and a rebound of pain after discontinuation.  She has a history of pseudogout in her right knee, treated with knee aspiration and a cortisone injection by Dr. Malinda, providing temporary relief for about two weeks. She also has a history of ACL reconstruction in the same knee.  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 She reports occasional pain in her right thumb. No significant morning stiffness in her hands.  She has a history of lower back surgery in 2022 and is considering further back surgery in her neck due to these ongoing issues with her severe shoulder pain. She reports episodes of severe pain accompanied by sweating and dizziness, which have been severe enough  to almost require hospital visits.  She reports being generally healthy and active in the past, with a high pain tolerance, but is currently struggling with the impact of her symptoms on her daily life.    Activities of Daily Living:  Patient reports morning stiffness for 1-2 hours.   Patient Reports nocturnal pain.  Difficulty dressing/grooming: Reports Difficulty climbing stairs: Reports Difficulty getting out of chair: Reports Difficulty using hands for taps, buttons, cutlery, and/or writing: Reports  Review of Systems  Constitutional:  Positive for fatigue.  HENT:  Negative for mouth sores and mouth dryness.   Eyes:  Negative for dryness.  Respiratory:  Negative for shortness of breath.   Cardiovascular:  Negative for chest pain and palpitations.  Gastrointestinal:  Positive for constipation. Negative for blood in stool and diarrhea.  Endocrine: Positive for increased urination.  Genitourinary:  Positive for involuntary urination.  Musculoskeletal:  Positive for joint pain, joint pain, joint swelling, myalgias, morning stiffness, muscle tenderness and myalgias. Negative for gait problem and muscle weakness.  Skin:  Negative for color change, rash, hair loss and sensitivity to sunlight.  Allergic/Immunologic: Negative for susceptible to infections.  Neurological:  Positive for dizziness. Negative for headaches.  Hematological:  Negative for swollen glands.  Psychiatric/Behavioral:  Positive for sleep disturbance. Negative for depressed mood. The patient is nervous/anxious.      PMFS History:  Patient Active Problem List   Diagnosis Date Noted   Other  spondylosis with radiculopathy, lumbar region 03/27/2021   Peripheral venous insufficiency 02/16/2021   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 muscle vein of left lower extremity (HCC) 02/05/2021   Vitamin D  deficiency 10/11/2019   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   Osteopenia 05/24/2014   Varicose  veins of bilateral lower extremities with other complications 02/13/2014   Hyperlipidemia 05/31/2006    Past Medical History:  Diagnosis Date   Acute deep vein thrombosis (DVT) of proximal vein of left lower extremity (HCC)    Allergy to multiple antibiotics    C. difficile diarrhea    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 muscle vein of left lower extremity (HCC)    Colon cancer screening    DDD (degenerative disc disease), lumbar    Dyspareunia in female 08/21/2015   Fatty liver    Frequent UTI    Hyperlipidemia    IBS (irritable bowel syndrome)    KNEE PAIN, RIGHT 07/20/2006   Qualifier: Diagnosis of  By: Alvan MD, Catherine     LIVER FUNCTION TESTS, ABNORMAL 09/28/2007   Qualifier: Diagnosis of  By: Vicci LPN, Kim     Neck pain    Osteopenia    Other and unspecified ovarian cysts    history   Vaginal atrophy 09/02/2021   Varicose veins of bilateral lower extremities with other complications    Vitamin D  deficiency     Family History  Adopted: Yes  Problem Relation Age of Onset   Alcohol abuse Father    Past Surgical History:  Procedure Laterality Date   ARTHROSCOPIC REPAIR ACL     RT knee   BUNIONECTOMY  08/23/1992   bilateral   CARPAL TUNNEL RELEASE Right    CARPAL TUNNEL RELEASE Left 08/23/1998   Left    EYELID LACERATION REPAIR Bilateral 11/30/2021   OVARIAN CYST REMOVAL  08/23/1986   left    POSTERIOR LUMBAR LAMINECTOMY L4-S1 AND FUSION WITH INSTRUMENTATION, L4-L5 TRANSFORAMINAL LUMBAR INTERBODY FUSION AND BONE   04/20/2021   TONSILLECTOMY     right   TUBAL LIGATION     VEIN LIGATION AND STRIPPING  08/24/1979   Social History   Social History Narrative   No regular exercise. 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 She was adopted at age 36. She does drink coffee daily. She has 2 biological children and 2 adopted children.   Immunization History  Administered Date(s) Administered   Influenza Split 07/29/2011, 08/11/2012    Influenza Whole 06/23/2005, 06/26/2007   Influenza-Unspecified 06/08/2013   PNEUMOCOCCAL CONJUGATE-20 04/28/2022   Td 06/23/2004   Tdap 05/19/2012     Objective: Vital Signs: BP (!) 157/80 (BP Location: Right Arm, Patient Position: Sitting, Cuff Size: Normal)   Pulse 73   Temp (!) 97.3 F (36.3 C)   Resp 16   Ht 5' 3 (1.6 m)   Wt 139 lb 3.2 oz (63.1 kg)   BMI 24.66 kg/m    Physical Exam Vitals and nursing note reviewed.  HENT:     Head: Normocephalic and atraumatic.     Nose: Nose normal.  Eyes:     Conjunctiva/sclera: Conjunctivae normal.     Pupils: Pupils are equal, round, and reactive to light.  Cardiovascular:     Rate and Rhythm: Normal rate and regular rhythm.  Pulmonary:     Effort: Pulmonary effort is normal. No respiratory distress.  Skin:    General: Skin is warm and dry.  Neurological:     Mental  Status: She is alert. Mental status is at baseline.  Psychiatric:        Mood and Affect: Mood normal.        Behavior: Behavior normal.      Musculoskeletal Exam:   CDAI Exam: CDAI Score: -- Patient Global: --; Provider Global: -- Swollen: 4 ; Tender: 1  Joint Exam 06/04/2024      Right  Left  Acromioclavicular      Tender  MCP 2  Swollen      MCP 3  Swollen      MCP 4  Swollen      MCP 5  Swollen         Investigation: No additional findings.  Imaging: XR Lumbar Spine Complete Result Date: 05/17/2024 XRs of the lumbar spine from 05/17/2024 were independently reviewed and interpreted, showing posterior spinal instrumentation from L4-S1.  There is no lucency seen around the screws.  There is an interbody device in the former L4/5 disc space.  No lucency seen around the interbody.  Laminectomy defect from L4-S1.  Disc height loss with small anterior osteophyte formation at L3/4.  No other significant degenerative changes seen.  No evidence of instability on flexion/extension views.  No fracture or dislocation seen.  XR Cervical Spine Complete Result  Date: 05/17/2024 XRs of the cervical spine from 05/17/2024 were independently reviewed and interpreted, showing disc height loss at C5/6 and C6/7 with small anterior osteophyte formation.  No evidence of instability on flexion/extension views.  No fracture or dislocation seen.  Facet arthropathy seen at C6/7 and C7/T1.  MRI L Shoulder (01/26/24) Tendinosis of rotator cuff Probable chronic peripheral tear of anterior labrum w/ degenerative changes Reactive fluid v. Mild bursitis of subacromial subdeltoid bursa  MRI R Shoulder (01/26/24) Tendinosis of rotator cuff and long head of biceps w/ partial tear of anterior supraspinatous insertion Moderate glenohumeral joint effusion possibly degenerative v reactive or posttraumatic w/ mild subacromial subdeltoid bursitis Degenerative labral changes w/ type I SLAP  Recent Labs: Lab Results  Component Value Date   WBC 5.3 05/02/2023   HGB 13.8 05/02/2023   PLT 280.0 05/02/2023   NA 142 11/11/2023   K 4.8 11/11/2023   CL 105 11/11/2023   CO2 23 11/11/2023   GLUCOSE 76 11/11/2023   BUN 15 11/11/2023   CREATININE 0.61 11/11/2023   BILITOT 0.7 05/02/2023   ALKPHOS 49 05/02/2023   AST 19 05/02/2023   ALT 24 05/02/2023   PROT 6.5 05/02/2023   ALBUMIN 4.3 05/02/2023   CALCIUM 9.6 11/11/2023   GFRAA 87 12/27/2012   Lab Results  Component Value Date   ANA NEG 08/13/2013   RF < 20 IU/mL 10/25/2009    Speciality Comments: No specialty comments available.  Procedures:  No procedures performed Allergies: Ciprofloxacin , Fenofibrate, Morphine, Penicillins, Statins, Sulfamethoxazole -trimethoprim, Tizanidine hcl, Amoxicillin, Dexamethasone , and Latex   Assessment / Plan:     Visit Diagnoses:  Bilateral shoulder pain Bilateral subacromial bursitis Neuropathy? Patient with severe burning shoulder pain of unclear etiology that is localized to the proximal b/l arms. The cause of her symptoms are not entirely clear. She has MRI's of b/l shoulders that  show tendinosis and bursitis that would explain some aspect of her severe shoulder symptoms. However, the burning nature of her pain raises concerns for possible neuropathy; which may be secondary to her DDD demonstrated on cervical MRI. Unclear if EMG would be beneficial in this scenario for further evaluation.  Given inflammatory changes on MRI (which can be seen  in PMR) and significant improvement with prednisone , will obtain inflammatory markers today to evaluate for possible PMR. However, patient is very insistent against any use of prednisone  given rebound effect, which would make treatment of PMR extremely difficult. Patient verbalizes understanding.  Joint swelling Patient with swelling of MCP's of right hand of unclear etiology. She denies any significant AM stiffness or noticeable joint swelling. She does note that when she was on prednisone  she woke up and her wedding ring had fallen off, suggesting likely some component of joint swelling was present and responded to prednisone . Will obtain DG Hand 2 View Right, DG Hand 2 View Left Rheumatoid factor, Cyclic citrul peptide antibody, IgG.  Orders: Orders Placed This Encounter  Procedures   DG Hand 2 View Right   DG Hand 2 View Left   Sedimentation rate   C-reactive protein   Rheumatoid factor   Cyclic citrul peptide antibody, IgG   No orders of the defined types were placed in this encounter.   I personally spent a total of 60 minutes in the care of the patient today including preparing to see the patient, getting/reviewing separately obtained history, performing a medically appropriate exam/evaluation, counseling and educating, placing orders, and documenting clinical information in the EHR.  Follow-Up Instructions: Return if symptoms worsen or fail to improve.   Asberry Claw, DO

## 2024-06-04 ENCOUNTER — Ambulatory Visit

## 2024-06-04 ENCOUNTER — Ambulatory Visit (INDEPENDENT_AMBULATORY_CARE_PROVIDER_SITE_OTHER)

## 2024-06-04 ENCOUNTER — Telehealth: Payer: Self-pay | Admitting: Orthopedic Surgery

## 2024-06-04 VITALS — BP 157/80 | HR 73 | Temp 97.3°F | Resp 16 | Ht 63.0 in | Wt 139.2 lb

## 2024-06-04 DIAGNOSIS — M7552 Bursitis of left shoulder: Secondary | ICD-10-CM | POA: Insufficient documentation

## 2024-06-04 DIAGNOSIS — M2550 Pain in unspecified joint: Secondary | ICD-10-CM | POA: Insufficient documentation

## 2024-06-04 DIAGNOSIS — M25511 Pain in right shoulder: Secondary | ICD-10-CM | POA: Insufficient documentation

## 2024-06-04 DIAGNOSIS — M79642 Pain in left hand: Secondary | ICD-10-CM | POA: Diagnosis not present

## 2024-06-04 DIAGNOSIS — M19041 Primary osteoarthritis, right hand: Secondary | ICD-10-CM | POA: Diagnosis not present

## 2024-06-04 DIAGNOSIS — M25512 Pain in left shoulder: Secondary | ICD-10-CM | POA: Insufficient documentation

## 2024-06-04 DIAGNOSIS — M1120 Other chondrocalcinosis, unspecified site: Secondary | ICD-10-CM | POA: Diagnosis not present

## 2024-06-04 DIAGNOSIS — M85842 Other specified disorders of bone density and structure, left hand: Secondary | ICD-10-CM | POA: Diagnosis not present

## 2024-06-04 DIAGNOSIS — M7551 Bursitis of right shoulder: Secondary | ICD-10-CM | POA: Insufficient documentation

## 2024-06-04 DIAGNOSIS — M79641 Pain in right hand: Secondary | ICD-10-CM

## 2024-06-04 DIAGNOSIS — G629 Polyneuropathy, unspecified: Secondary | ICD-10-CM | POA: Diagnosis not present

## 2024-06-04 DIAGNOSIS — M1812 Unilateral primary osteoarthritis of first carpometacarpal joint, left hand: Secondary | ICD-10-CM | POA: Diagnosis not present

## 2024-06-04 DIAGNOSIS — M85841 Other specified disorders of bone density and structure, right hand: Secondary | ICD-10-CM | POA: Diagnosis not present

## 2024-06-04 DIAGNOSIS — M19042 Primary osteoarthritis, left hand: Secondary | ICD-10-CM | POA: Diagnosis not present

## 2024-06-04 NOTE — Telephone Encounter (Signed)
 Pt came in and I put 2 MRI results on [employer]. Pt asked for a call. Pt phone number is [phone].

## 2024-06-06 ENCOUNTER — Ambulatory Visit (HOSPITAL_BASED_OUTPATIENT_CLINIC_OR_DEPARTMENT_OTHER): Admitting: Orthopaedic Surgery

## 2024-06-06 LAB — RHEUMATOID FACTOR: Rheumatoid fact SerPl-aCnc: <10 [IU]/mL (ref <14)

## 2024-06-06 LAB — SEDIMENTATION RATE: Sed Rate: 38 mm/h — ABNORMAL HIGH (ref 0–30)

## 2024-06-06 LAB — C-REACTIVE PROTEIN: CRP: 31.1 mg/L — ABNORMAL HIGH (ref <8.0)

## 2024-06-06 LAB — CYCLIC CITRUL PEPTIDE ANTIBODY, IGG: Cyclic Citrullin Peptide Ab: <16 U

## 2024-06-07 NOTE — Telephone Encounter (Signed)
 Patient advised of message from Dr. Georgina. She is going to signup for MyChart and would like me to forward her this message.  Patient states that she has been having a lot of tailbone pain, she has been using a cushion to sit, and NKI. She states that she has been taking a medicine from the [facility] store, that has been helping her. She will send us  the info on it via MyChart.

## 2024-06-14 ENCOUNTER — Telehealth: Payer: Self-pay | Admitting: *Deleted

## 2024-06-14 ENCOUNTER — Ambulatory Visit
Admission: EM | Admit: 2024-06-14 | Discharge: 2024-06-14 | Disposition: A | Discharge status: Home / Self Care | Attending: Family Medicine | Admitting: Family Medicine

## 2024-06-14 DIAGNOSIS — N3000 Acute cystitis without hematuria: Secondary | ICD-10-CM | POA: Diagnosis not present

## 2024-06-14 LAB — POCT URINE DIPSTICK
Bilirubin, UA: NEGATIVE
Blood, UA: NEGATIVE
Glucose, UA: NEGATIVE mg/dL
Ketones, POC UA: NEGATIVE mg/dL
Nitrite, UA: POSITIVE — AB
POC PROTEIN,UA: NEGATIVE
Spec Grav, UA: 1.02 (ref 1.010–1.025)
Urobilinogen, UA: 0.2 U/dL
pH, UA: 7 (ref 5.0–8.0)

## 2024-06-14 MED ORDER — NITROFURANTOIN MONOHYD MACRO 100 MG PO CAPS: 100.0000 mg | ORAL_CAPSULE | Freq: Two times a day (BID) | ORAL | 0 refills | Status: AC

## 2024-06-14 NOTE — ED Provider Notes (Signed)
 Tiffany Bender    CSN: [id number] Arrival date & time: 06/14/24  1800      History   Chief Complaint Chief Complaint  Patient presents with   Dysuria    HPI Tiffany Bender is a 73 y.o. female.   HPI Pleasant 73 year old female presents with dysuria for 2 weeks.  Reports last year in March she was given Macrobid .  PMH significant for chronic DVT, osteopenia, and HLD  Past Medical History:  Diagnosis Date   Acute deep vein thrombosis (DVT) of proximal vein of left lower extremity (HCC)    Allergy to multiple antibiotics    C. difficile diarrhea    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 muscle vein of left lower extremity (HCC)    Colon cancer screening    DDD (degenerative disc disease), lumbar    Dyspareunia in female 08/21/2015   Fatty liver    Frequent UTI    Hyperlipidemia    IBS (irritable bowel syndrome)    KNEE PAIN, RIGHT 07/20/2006   Qualifier: Diagnosis of  By: Alvan MD, Catherine     LIVER FUNCTION TESTS, ABNORMAL 09/28/2007   Qualifier: Diagnosis of  By: Vicci LPN, Kim     Neck pain    Osteopenia    Other and unspecified ovarian cysts    history   Vaginal atrophy 09/02/2021   Varicose veins of bilateral lower extremities with other complications    Vitamin D  deficiency     Patient Active Problem List   Diagnosis Date Noted   Other spondylosis with radiculopathy, lumbar region 03/27/2021   Peripheral venous insufficiency 02/16/2021   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 muscle vein of left lower extremity (HCC) 02/05/2021   Vitamin D  deficiency 10/11/2019   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   Osteopenia 05/24/2014   Varicose veins of bilateral lower extremities with other complications 02/13/2014   Hyperlipidemia 05/31/2006    Past Surgical History:  Procedure Laterality Date   ARTHROSCOPIC REPAIR ACL     RT knee   BUNIONECTOMY  08/23/1992   bilateral   CARPAL TUNNEL RELEASE Right    CARPAL  TUNNEL RELEASE Left 08/23/1998   Left    EYELID LACERATION REPAIR Bilateral 11/30/2021   OVARIAN CYST REMOVAL  08/23/1986   left    POSTERIOR LUMBAR LAMINECTOMY L4-S1 AND FUSION WITH INSTRUMENTATION, L4-L5 TRANSFORAMINAL LUMBAR INTERBODY FUSION AND BONE   04/20/2021   TONSILLECTOMY     right   TUBAL LIGATION     VEIN LIGATION AND STRIPPING  08/24/1979    OB History   No obstetric history on file.      Home Medications    Prior to Admission medications   Medication Sig Start Date End Date Taking? Authorizing Provider  nitrofurantoin , macrocrystal-monohydrate, (MACROBID ) 100 MG capsule Take 1 capsule (100 mg total) by mouth 2 (two) times daily for 7 days. 06/14/24 06/21/24 Yes Teddy Sharper, FNP  ascorbic acid (VITAMIN C) 500 MG tablet Take 1,000 mg by mouth daily.    [provider]  aspirin EC 81 MG tablet Take 81 mg by mouth daily. Swallow whole.    [provider]  BORON PO Take 6 mg by mouth.    [provider]  Cholecalciferol 125 MCG (5000 UT) TABS Take 2,000 Units by mouth. 04/14/21   [provider]  Cranberry 405 MG CAPS 870 mg.    [provider]  hydrocortisone  1 % ointment Apply 1 application topically 2 (two) times  daily as needed for itching. 09/10/21   Kuneff, Renee A, DO  methylPREDNISolone  (MEDROL  DOSEPAK) 4 MG TBPK tablet Take as prescribed on the box Patient not taking: Reported on 06/04/2024 05/17/24   Georgina Ozell LABOR, MD  Multiple Vitamin (MULTIVITAMIN ADULT PO) Take by mouth.    [provider]  Probiotic TBEC Take 250 mg by mouth See admin instructions.    [provider]  UNABLE TO FIND Med Name: Cumarin    [provider]  vitamin E 180 MG (400 UNITS) capsule Take 400 Units by mouth daily.    [provider]    Family History Family History  Adopted: Yes  Problem Relation Age of Onset   Alcohol abuse Father     Social History Social History   Tobacco Use   Smoking  status: Former    Current packs/day: 0.00    Types: Cigarettes    Quit date: 08/24/1983    Years since quitting: 40.8    Passive exposure: Never   Smokeless tobacco: Never  Vaping Use   Vaping status: Never Used  Substance Use Topics   Alcohol use: Not Currently    Comment: socially   Drug use: No     Allergies   Ciprofloxacin , Fenofibrate, Morphine, Penicillins, Statins, Sulfamethoxazole -trimethoprim, Tizanidine hcl, Amoxicillin, Dexamethasone , and Latex   Review of Systems Review of Systems  Genitourinary:  Positive for dysuria.  All other systems reviewed and are negative.    Physical Exam Triage Vital Signs ED Triage Vitals  Encounter Vitals Group     BP      Girls Systolic BP Percentile      Girls Diastolic BP Percentile      Boys Systolic BP Percentile      Boys Diastolic BP Percentile      Pulse      Resp      Temp      Temp src      SpO2      Weight      Height      Head Circumference      Peak Flow      Pain Score      Pain Loc      Pain Education      Exclude from Growth Chart    No data found.  Updated Vital Signs BP (!) 177/93 (BP Location: Right Arm)   Pulse 81   Temp 98.2 F (36.8 C) (Oral)   Resp 17   SpO2 100%    Physical Exam Vitals and nursing note reviewed.  Constitutional:      Appearance: Normal appearance. She is normal weight.  HENT:     Head: Normocephalic and atraumatic.     Mouth/Throat:     Mouth: Mucous membranes are moist.     Pharynx: Oropharynx is clear.  Eyes:     Extraocular Movements: Extraocular movements intact.     Conjunctiva/sclera: Conjunctivae normal.     Pupils: Pupils are equal, round, and reactive to light.  Cardiovascular:     Rate and Rhythm: Normal rate and regular rhythm.     Pulses: Normal pulses.     Heart sounds: Normal heart sounds.  Pulmonary:     Effort: Pulmonary effort is normal.     Breath sounds: Normal breath sounds.  Abdominal:     Tenderness: There is no right CVA tenderness or  left CVA tenderness.  Musculoskeletal:        General: Normal range of motion.  Skin:  General: Skin is warm and dry.  Neurological:     General: No focal deficit present.     Mental Status: She is alert and oriented to person, place, and time. Mental status is at baseline.  Psychiatric:        Mood and Affect: Mood normal.        Behavior: Behavior normal.      UC Treatments / Results  Labs (all labs ordered are listed, but only abnormal results are displayed) Labs Reviewed  POCT URINE DIPSTICK - Abnormal; Notable for the following components:      Result Value   Clarity, UA cloudy (*)    Nitrite, UA Positive (*)    Leukocytes, UA Trace (*)    All other components within normal limits  URINE CULTURE    EKG   Radiology No results found.  Procedures Procedures (including critical Bender time)  Medications Ordered in UC Medications - No data to display  Initial Impression / Assessment and Plan / UC Course  I have reviewed the triage vital signs and the nursing notes.  Pertinent labs & imaging results that were available during my Bender of the patient were reviewed by me and considered in my medical decision making (see chart for details).     MDM: Acute cystitis without hematuria-UA revealed above, urine culture ordered yes Rx'd Macrobid  100 mg capsule: Take 1 capsule twice daily x 7. Advised patient to take medication as directed with food to completion.  Encouraged to increase daily water intake to 64 ounces per day while taking this medication.  Advised we will follow-up with urine culture results once received.  Advised if symptoms worsen and/or unresolved please follow-up with your PCP, [facility], or here for further evaluation.  Patient discharged home, hemodynamically stable Final Clinical Impressions(s) / UC Diagnoses   Final diagnoses:  Acute cystitis without hematuria     Discharge Instructions      Advised patient to take medication as  directed with food to completion.  Encouraged to increase daily water intake to 64 ounces per day while taking this medication.  Advised we will follow-up with urine culture results once received.  Advised if symptoms worsen and/or unresolved please follow-up with your PCP, [facility], or here for further evaluation.     ED Prescriptions     Medication Sig Dispense Auth. Provider   nitrofurantoin , macrocrystal-monohydrate, (MACROBID ) 100 MG capsule Take 1 capsule (100 mg total) by mouth 2 (two) times daily for 7 days. 14 capsule Ilyas Lipsitz, FNP      PDMP not reviewed this encounter.   Teddy Sharper, FNP 06/14/24 AMOS

## 2024-06-14 NOTE — Telephone Encounter (Signed)
 Patient contacted the office requesting her lab results. Patient also wanted to let you know her blood type is AB positive if that makes a difference in her labs.

## 2024-06-14 NOTE — ED Triage Notes (Addendum)
 Pt c/o dysuria x 2 weeks. Last UTI in march she was given macrobid 

## 2024-06-14 NOTE — Discharge Instructions (Addendum)
 Advised patient to take medication as directed with food to completion.  Encouraged to increase daily water intake to 64 ounces per day while taking this medication.  Advised we will follow-up with urine culture results once received.  Advised if symptoms worsen and/or unresolved please follow-up with your PCP, [facility], or here for further evaluation.

## 2024-06-15 ENCOUNTER — Telehealth: Payer: Self-pay

## 2024-06-15 NOTE — Telephone Encounter (Signed)
 Called to check on patient. Got medication. No needs.

## 2024-06-17 LAB — URINE CULTURE: Culture: >=100000 — AB

## 2024-06-18 ENCOUNTER — Ambulatory Visit (HOSPITAL_COMMUNITY): Payer: Self-pay

## 2024-06-19 ENCOUNTER — Telehealth: Payer: Self-pay

## 2024-06-19 NOTE — Telephone Encounter (Signed)
 Patient called stating she had an appointment on 06/04/24 and has not received a return call with the results of her labwork.  Patient states she was able to view them on mychart, but doesn't understand the results.  Patient states she has never had an appointment where someone didn't call her with the results of her tests.  Patient states she is not a doctor and shouldn't be responsible for interpreting her labwork results.

## 2024-06-20 NOTE — Telephone Encounter (Signed)
 Reached out to patient and scheduled for 06/21/2024

## 2024-06-21 ENCOUNTER — Ambulatory Visit

## 2024-06-21 VITALS — BP 152/79 | HR 85 | Temp 97.5°F | Resp 13 | Ht 63.0 in | Wt 140.2 lb

## 2024-06-21 DIAGNOSIS — M353 Polymyalgia rheumatica: Secondary | ICD-10-CM | POA: Diagnosis not present

## 2024-06-21 DIAGNOSIS — Z79899 Other long term (current) drug therapy: Secondary | ICD-10-CM | POA: Insufficient documentation

## 2024-06-21 NOTE — Patient Instructions (Signed)
 Methotrexate  Tablets What is this medication? METHOTREXATE  (METH oh TREX ate) treats autoimmune conditions, such as arthritis and psoriasis. It works by decreasing inflammation, which can reduce pain and prevent long-term injury to the joints and skin. It may also be used to treat some types of cancer. It works by slowing down the growth of cancer cells. This medicine may be used for other purposes; ask your health care provider or pharmacist if you have questions. COMMON BRAND NAME(S): Rheumatrex, Trexall  What should I tell my care team before I take this medication? They need to know if you have any of these conditions: Dehydration Diabetes Fluid in the stomach area or lungs Frequently drink alcohol Having surgery, including dental surgery High cholesterol Immune system problems Inflammatory bowel disease, such as ulcerative colitis Kidney disease Liver disease Low blood cell levels (white cells, red cells, and platelets) Lung disease Recent or ongoing radiation Recent or upcoming vaccine Stomach ulcers, other stomach or intestine problems An unusual or allergic reaction to methotrexate , other medications, foods, dyes, or preservatives Pregnant or trying to get pregnant Breastfeeding How should I use this medication? Take this medication by mouth with water. Take it as directed on the prescription label. Do not take extra. Keep taking this medication until your care team tells you to stop. Know why you are taking this medication and how you should take it. To treat conditions such as arthritis and psoriasis, this medication is taken ONCE A WEEK as a single dose or divided into 3 smaller doses taken 12 hours apart (do not take more than 3 doses 12 hours apart each week). This medication is NEVER taken daily to treat conditions other than cancer. Taking this medication more often than directed can cause serious side effects, even death. Talk to your care team about why you are taking this  medication, how often you will take it, and what your dose is. Ask your care team to put the reason you take this medication on the prescription. If you take this medication ONCE A WEEK, choose a day of the week before you start. Ask your pharmacist to include the day of the week on the label. Avoid Monday, which could be misread as Morning. Handling this medication may be harmful. Talk to your care team about how to handle this medication. Special instructions may apply. Talk to your care team about the use of this medication in children. While it may be prescribed for selected conditions, precautions do apply. Overdosage: If you think you have taken too much of this medicine contact a poison control center or emergency room at once. NOTE: This medicine is only for you. Do not share this medicine with others. What if I miss a dose? If you miss a dose, talk with your care team. Do not take double or extra doses. What may interact with this medication? Do not take this medication with any of the following: Acitretin Live virus vaccines Probenecid This medication may also interact with the following: Alcohol Aspirin and aspirin-like medications Certain antibiotics, such as penicillin, neomycin, sulfamethoxazole; trimethoprim Certain medications for stomach problems, such as lansoprazole, omeprazole, pantoprazole Clozapine Cyclosporine Dapsone Folic acid  Foscarnet NSAIDs, medications for pain and inflammation, such as ibuprofen or naproxen Phenytoin Pyrimethamine Steroid medications, such as prednisone  or cortisone Tacrolimus Theophylline This list may not describe all possible interactions. Give your health care provider a list of all the medicines, herbs, non-prescription drugs, or dietary supplements you use. Also tell them if you smoke, drink alcohol, or use  illegal drugs. Some items may interact with your medicine. What should I watch for while using this medication? Visit your  care team for regular checks on your progress. It may be some time before you see the benefit from this medication. You may need blood work done while you are taking this medication. If your care team has also prescribed folic acid , they may instruct you to skip your folic acid  dose on the day you take methotrexate . This medication can make you more sensitive to the sun. Keep out of the sun. If you cannot avoid being in the sun, wear protective clothing and sunscreen. Do not use sun lamps, tanning beds, or tanning booths. Check with your care team if you have severe diarrhea, nausea, and vomiting, or if you sweat a lot. The loss of too much body fluid may make it dangerous for you to take this medication. This medication may increase your risk of getting an infection. Call your care team for advice if you get a fever, chills, sore throat, or other symptoms of a cold or flu. Do not treat yourself. Try to avoid being around people who are sick. Talk to your care team about your risk of cancer. You may be more at risk for certain types of cancers if you take this medication. Talk to your care team if you or your partner may be pregnant. Serious birth defects can occur if you take this medication during pregnancy and for 6 months after the last dose. You will need a negative pregnancy test before starting this medication. Contraception is recommended while taking this medication and for 6 months after the last dose. Your care team can help you find the option that works for you. If your partner can get pregnant, use a condom during sex while taking this medication and for 3 months after the last dose. Do not breastfeed while taking this medication and for 1 week after the last dose. This medication may cause infertility. Talk to your care team if you are concerned about your fertility. What side effects may I notice from receiving this medication? Side effects that you should report to your care team as soon  as possible: Allergic reactions--skin rash, itching, hives, swelling of the face, lips, tongue, or throat Dry cough, shortness of breath or trouble breathing Infection--fever, chills, cough, sore throat, wounds that don't heal, pain or trouble when passing urine, general feeling of discomfort or being unwell Kidney injury--decrease in the amount of urine, swelling of the ankles, hands, or feet Liver injury--right upper belly pain, loss of appetite, nausea, light-colored stool, dark yellow or brown urine, yellowing skin or eyes, unusual weakness or fatigue Low red blood cell level--unusual weakness or fatigue, dizziness, headache, trouble breathing Pain, tingling, or numbness in the hands or feet, muscle weakness, change in vision, confusion or trouble speaking, loss of balance or coordination, trouble walking, seizures Redness, blistering, peeling, or loosening of the skin, including inside the mouth Stomach bleeding--bloody or black, tar-like stools, vomiting blood or brown material that looks like coffee grounds Stomach pain that is severe, does not go away, or gets worse Unusual bruising or bleeding Side effects that usually do not require medical attention (report these to your care team if they continue or are bothersome): Diarrhea Dizziness Hair loss Nausea Pain, redness, or swelling with sores inside the mouth or throat Skin reactions on sun-exposed areas Vomiting This list may not describe all possible side effects. Call your doctor for medical advice about side effects.  You may report side effects to FDA at 1-800-FDA-1088. Where should I keep my medication? Keep out of the reach of children and pets. Store at room temperature between 20 and 25 degrees C (68 and 77 degrees F). Protect from light. Keep the container tightly closed. Get rid of any unused medication after the expiration date. To get rid of medications that are no longer needed or have expired: Take the medication to a  medication take-back program. Check with your pharmacy or law enforcement to find a location. If you cannot return the medication, ask your pharmacist or care team how to get rid of this medication safely. NOTE: This sheet is a summary. It may not cover all possible information. If you have questions about this medicine, talk to your doctor, pharmacist, or health care provider.  2024 Elsevier/[employer] Standard (2023-07-22 00:00:00)

## 2024-06-21 NOTE — Progress Notes (Signed)
 Office Visit Note  Patient: Tiffany Bender Ebbie             Date of Birth: [birth year]           MRN: [id number]             PCP: Colette Torrence GRADE, MD Referring: Colette Torrence GRADE, MD Visit Date: 06/21/2024 Occupation: Data Unavailable  Subjective:  Results  Discussed the use of AI scribe software for clinical note transcription with the patient, who gave verbal consent to proceed.  History of Present Illness Tiffany Bender is a 73 year old female with rheumatoid arthritis who presents with joint pain and concerns about medication side effects.  She has been experiencing significant joint pain, particularly in her shoulders, for most of the past year. The pain has led to difficulty walking, sitting, getting up, and dressing, especially in the morning. She describes herself as 'partially disabled' due to the pain. She recalls being told that her MRI showed inflammation in her shoulders. She has been using Advil recently, which she finds more effective than Aleve, but is concerned it may not be strong enough to manage her symptoms. She is also taking baby aspirin daily but is unsure about the timing with Advil due to her pain levels.  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 She is concerned about the potential for methotrexate to increase her cholesterol levels, although she notes that previous scans of her arteries and heart showed minimal issues despite high cholesterol levels.  She recently discovered she had an acute UTI after experiencing burning during urination, which she initially overlooked due to other pain. She is concerned about the interaction of methotrexate with antibiotics, as she can only take Macrobid  for UTIs.  She reports a rash on her shoulder, which she associates with the use of Voltaren gel.  She is currently taking a multivitamin that she finds beneficial for memory and other health aspects. She is also aware of the need to take  folic acid daily while on methotrexate to mitigate potential side effects.     Activities of Daily Living:  Patient reports morning stiffness for 1 hour.   Patient Denies nocturnal pain.  Difficulty dressing/grooming: Reports Difficulty climbing stairs: Reports Difficulty getting out of chair: Denies Difficulty using hands for taps, buttons, cutlery, and/or writing: Reports  Review of Systems  Constitutional:  Negative for fatigue.  HENT:  Negative for mouth sores and mouth dryness.   Eyes:  Negative for dryness.  Respiratory:  Negative for shortness of breath.   Cardiovascular:  Negative for chest pain and palpitations.  Gastrointestinal:  Positive for diarrhea. Negative for blood in stool and constipation.  Endocrine: Positive for increased urination.  Genitourinary:  Negative for involuntary urination.  Musculoskeletal:  Positive for joint pain, joint pain, muscle weakness, morning stiffness and muscle tenderness. Negative for gait problem, joint swelling, myalgias and myalgias.  Skin:  Positive for rash. Negative for color change, hair loss and sensitivity to sunlight.  Allergic/Immunologic: Positive for susceptible to infections.  Neurological:  Negative for dizziness and headaches.  Hematological:  Positive for swollen glands.  Psychiatric/Behavioral:  Positive for depressed mood and sleep disturbance. The patient is not nervous/anxious.     PMFS History:  Patient Active Problem List   Diagnosis Date Noted   Other spondylosis with radiculopathy, lumbar region 03/27/2021   Peripheral venous insufficiency 02/16/2021   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 muscle vein of left  lower extremity (HCC) 02/05/2021   Vitamin D  deficiency 10/11/2019   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   Osteopenia 05/24/2014   Varicose veins of bilateral lower extremities with other complications 02/13/2014   Hyperlipidemia 05/31/2006    Past Medical History:  Diagnosis  Date   Acute deep vein thrombosis (DVT) of proximal vein of left lower extremity (HCC)    Allergy to multiple antibiotics    C. difficile diarrhea    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 muscle vein of left lower extremity (HCC)    Colon cancer screening    DDD (degenerative disc disease), lumbar    Dyspareunia in female 08/21/2015   Fatty liver    Frequent UTI    Hyperlipidemia    IBS (irritable bowel syndrome)    KNEE PAIN, RIGHT 07/20/2006   Qualifier: Diagnosis of  By: Alvan MD, Catherine     LIVER FUNCTION TESTS, ABNORMAL 09/28/2007   Qualifier: Diagnosis of  By: Vicci LPN, Kim     Neck pain    Osteopenia    Other and unspecified ovarian cysts    history   Vaginal atrophy 09/02/2021   Varicose veins of bilateral lower extremities with other complications    Vitamin D  deficiency     Family History  Adopted: Yes  Problem Relation Age of Onset   Alcohol abuse Father    Past Surgical History:  Procedure Laterality Date   ARTHROSCOPIC REPAIR ACL     RT knee   BUNIONECTOMY  08/23/1992   bilateral   CARPAL TUNNEL RELEASE Right    CARPAL TUNNEL RELEASE Left 08/23/1998   Left    EYELID LACERATION REPAIR Bilateral 11/30/2021   OVARIAN CYST REMOVAL  08/23/1986   left    POSTERIOR LUMBAR LAMINECTOMY L4-S1 AND FUSION WITH INSTRUMENTATION, L4-L5 TRANSFORAMINAL LUMBAR INTERBODY FUSION AND BONE   04/20/2021   TONSILLECTOMY     right   TUBAL LIGATION     VEIN LIGATION AND STRIPPING  08/24/1979   Social History   Tobacco Use   Smoking status: Former    Current packs/day: 0.00    Types: Cigarettes    Quit date: 08/24/1983    Years since quitting: 40.8    Passive exposure: Never   Smokeless tobacco: Never  Vaping Use   Vaping status: Never Used  Substance Use Topics   Alcohol use: Not Currently    Comment: socially   Drug use: No   Social History   Social History Narrative   No regular exercise. She is a futures trader. She is married to [employer]. She  was adopted at age 40. She does drink coffee daily. She has 2 biological children and 2 adopted children.     Immunization History  Administered Date(s) Administered   Influenza Split 07/29/2011, 08/11/2012   Influenza Whole 06/23/2005, 06/26/2007   Influenza-Unspecified 06/08/2013   PNEUMOCOCCAL CONJUGATE-20 04/28/2022   Td 06/23/2004   Tdap 05/19/2012     Objective: Vital Signs: BP (!) 152/79 (BP Location: Left Arm, Patient Position: Sitting)   Pulse 85   Temp (!) 97.5 F (36.4 C)   Resp 13   Ht 5' 3 (1.6 m)   Wt 140 lb 3.2 oz (63.6 kg)   BMI 24.84 kg/m    Physical Exam   Musculoskeletal Exam: ***  CDAI Exam: CDAI Score: -- Patient Global: --; Provider Global: -- Swollen: --; Tender: -- Joint Exam 06/21/2024   No joint exam has been documented for this visit   There is  currently no information documented on the homunculus. Go to the Rheumatology activity and complete the homunculus joint exam.  Investigation: No additional findings.  Imaging: DG Hand 2 View Right Result Date: 06/07/2024 EXAM: 1 OR 2 VIEW(S) XRAY OF THE BILATERAL HAND 06/04/2024 05:16:00 PM COMPARISON: None available. CLINICAL HISTORY: bilateral hand pain. 73 year-old female c/o polyarthralgia in bilat hands FINDINGS: BONES AND JOINTS: No acute fracture. Mild diffuse narrowing of the interphalangeal joints. Osteoarthritic change most notable in the fifth DIP joint. Mild diffuse osteopenia. No joint dislocation. SOFT TISSUES: The soft tissues are unremarkable. IMPRESSION: 1. Mild osteoarthrosis of the interphalangeal joints, most pronounced at the fifth DIP joint. 2. Mild diffuse osteopenia. Electronically signed by: Katheleen Faes MD 06/07/2024 09:53 AM EDT RP Workstation: [id number]   DG Hand 2 View Left Result Date: 06/07/2024 EXAM: 1 or 2 VIEW(S) XRAY OF THE LEFT HAND 06/04/2024 05:16:00 PM COMPARISON: None available. CLINICAL HISTORY: bilateral hand pain. 73 year-old female c/o polyarthralgia in  bilat hands FINDINGS: BONES AND JOINTS: Severe joint space narrowing, subchondral sclerosis, and osteophyte formation at the first carpometacarpal joint compatible with osteoarthritis. Mild joint space narrowing and osteophyte formation at the interphalangeal joint of the thumb. Mild osteopenia. No acute fracture. No joint dislocation. SOFT TISSUES: The soft tissues are unremarkable. IMPRESSION: 1. 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 Mild osteoarthritis at the interphalangeal joint of the thumb 3. Mild osteopenia Electronically signed by: Katheleen Faes MD 06/07/2024 09:51 AM EDT RP Workstation: [id number]    Recent Labs: Lab Results  Component Value Date   WBC 7.2 06/21/2024   HGB 11.8 06/21/2024   PLT 523 (H) 06/21/2024   NA 142 11/11/2023   K 4.8 11/11/2023   CL 105 11/11/2023   CO2 23 11/11/2023   GLUCOSE 76 11/11/2023   BUN 15 11/11/2023   CREATININE 0.48 (L) 06/21/2024   BILITOT 0.7 05/02/2023   ALKPHOS 49 05/02/2023   AST 20 06/21/2024   ALT 44 (H) 06/21/2024   PROT 6.5 05/02/2023   ALBUMIN 4.3 05/02/2023   CALCIUM 9.6 11/11/2023   GFRAA 87 12/27/2012    Speciality Comments: No specialty comments available.  Procedures:  No procedures performed Allergies: Ciprofloxacin , Fenofibrate, Morphine, Penicillins, Statins, Sulfamethoxazole -trimethoprim, Tizanidine hcl, Amoxicillin, Dexamethasone , and Latex   Assessment / Plan:     Visit Diagnoses: Polymyalgia rheumatica - Plan: Sedimentation rate, C-reactive protein, Hepatitis B core antibody, IgM, Hepatitis B surface antigen, Hepatitis C antibody, CBC, Creatinine, serum, AST, ALT  Orders: Orders Placed This Encounter  Procedures   Sedimentation rate   C-reactive protein   Hepatitis B core antibody, IgM   Hepatitis B surface antigen   Hepatitis C antibody   CBC   Creatinine, serum   AST   ALT   No orders of the defined types were placed in this encounter.   Face-to-face time spent with patient  was *** minutes. Greater than 50% of time was spent in counseling and coordination of care.  Follow-Up Instructions: Return in about 2 months (around 08/21/2024).   Asberry Claw, DO  Note - This record has been created using Animal nutritionist.  Chart creation errors have been sought, but may not always  have been located. Such creation errors do not reflect on  the standard of medical care.

## 2024-06-22 ENCOUNTER — Ambulatory Visit: Payer: Self-pay

## 2024-06-22 LAB — HEPATITIS B SURFACE ANTIGEN: Hepatitis B Surface Ag: NONREACTIVE

## 2024-06-22 LAB — HEPATITIS B CORE ANTIBODY, IGM: Hep B C IgM: NONREACTIVE

## 2024-06-22 LAB — ALT: ALT: 44 U/L — ABNORMAL HIGH (ref 6–29)

## 2024-06-22 LAB — CBC
HCT: 36.1 % (ref 35.0–45.0)
Hemoglobin: 11.8 g/dL (ref 11.7–15.5)
MCH: 28.4 pg (ref 27.0–33.0)
MCHC: 32.7 g/dL (ref 32.0–36.0)
MCV: 87 fL (ref 80.0–100.0)
MPV: 10.6 fL (ref 7.5–12.5)
Platelets: 523 Thousand/uL — ABNORMAL HIGH (ref 140–400)
RBC: 4.15 Million/uL (ref 3.80–5.10)
RDW: 12.5 % (ref 11.0–15.0)
WBC: 7.2 Thousand/uL (ref 3.8–10.8)

## 2024-06-22 LAB — CREATININE, SERUM: Creat: 0.48 mg/dL — ABNORMAL LOW (ref 0.60–1.00)

## 2024-06-22 LAB — HEPATITIS C ANTIBODY: Hepatitis C Ab: NONREACTIVE

## 2024-06-22 LAB — SEDIMENTATION RATE: Sed Rate: 39 mm/h — ABNORMAL HIGH (ref 0–30)

## 2024-06-22 LAB — C-REACTIVE PROTEIN: CRP: 25.7 mg/L — ABNORMAL HIGH (ref <8.0)

## 2024-06-22 LAB — AST: AST: 20 U/L (ref 10–35)

## 2024-06-25 ENCOUNTER — Encounter: Payer: Self-pay | Admitting: Radiology

## 2024-06-29 ENCOUNTER — Telehealth: Payer: Self-pay

## 2024-06-29 NOTE — Telephone Encounter (Signed)
 Patient called to schedule her 3 month follow-up appointment and wanted me to let Dr. Luba know that she is taking Advil not Aleve.

## 2024-07-04 ENCOUNTER — Telehealth: Payer: Self-pay

## 2024-07-04 NOTE — Telephone Encounter (Signed)
 Patient called to report that she has been taking advil 4 times daily. She reports that she has developed diarrhea. Patent eliminated the curamin supplement last week, and the diarrhea did improve but now it has come back for the past week. Patient denies any abdominal pain, nausea, vomiting, denies blood in the stool. Patient also states her BP has been trending up and her temp trending low and she has been getting colder.   Patient would like to know any recommendations besides the advil as she has developed this reaction. Please advise. Thanks!

## 2024-07-10 NOTE — Telephone Encounter (Signed)
 Patient advised to follow-up with primary care provider if her diarrhea is still persistent, as she may need further evaluation and treatment. No other recommendations besides the ones we have discussed. She can try another anti-inflammatory, but Dr. Luba would recommend not taking any anti-inflammatories until her diarrhea resolves.   Patient would like to know how much Ibuprofen she should take daily. Patient states she is currently taking 5-6 tablets per day. Patient states she has started taking the Cumarin. Patient states she does not have a PCP until after establishing in December 2025.

## 2024-07-10 NOTE — Telephone Encounter (Signed)
 Attempted to contact the patient and left message for patient to call the office.

## 2024-07-12 ENCOUNTER — Encounter: Payer: Self-pay | Admitting: *Deleted

## 2024-07-12 NOTE — Telephone Encounter (Signed)
 Sent message to patient via my chart.

## 2024-07-26 ENCOUNTER — Encounter

## 2024-08-07 DIAGNOSIS — M25461 Effusion, right knee: Secondary | ICD-10-CM | POA: Diagnosis not present

## 2024-08-13 ENCOUNTER — Telehealth: Payer: Self-pay

## 2024-08-13 NOTE — Telephone Encounter (Signed)
 Patient contacted the office and states she is having difficulty getting up and down. Patient states she had been taking advil but has stopped after having an episode her blood pressure shooting up and chest pain. Patient states she did start taking Curamin again. Patient states she is scared to take the Methotrexate. Patient inquired what she should do or if Prednisone  is an option as well. Patient states if anything needs to be sent in, she would like it sent to [facility] on [address].

## 2024-08-14 ENCOUNTER — Encounter: Payer: Self-pay | Admitting: *Deleted

## 2024-08-14 MED ORDER — PREDNISONE 10 MG PO TABS: 10.0000 mg | ORAL_TABLET | Freq: Every day | ORAL | 0 refills | Status: DC

## 2024-08-14 NOTE — Addendum Note (Signed)
 Addended by: CENA ALFONSO CROME on: 08/14/2024 10:20 AM   Modules accepted: Orders

## 2024-08-14 NOTE — Telephone Encounter (Signed)
 Patient would like the prescription for Prednisone  to be sent to the pharmacy

## 2024-08-21 ENCOUNTER — Ambulatory Visit

## 2024-08-22 NOTE — Telephone Encounter (Signed)
 Patient called the office asking if she could take the prednisone  at night vs in the morning. States she had real bad diarrhea when taking it, she is also asking if she could take it in the morning an evening to try an speed up the help with her pain.

## 2024-09-04 NOTE — Progress Notes (Signed)
 "  Office Visit Note  Patient: Tiffany Bender             Date of Birth: 03/08/1951           MRN: [id number]             PCP: Lelon Rollene Dragon, DO Referring: Colette Torrence GRADE, MD Visit Date: 09/10/2024 Occupation: Data Unavailable  Subjective:  Medication Management (Patient state she is having pain in her right knee. Patient state she is also having pain in her shoulders. Patient states she is losing strength in her muscles and her hands. )   History of Present Illness: Tiffany Bender is a 74 y.o. female with PMR who presents for a 3 month follow up. She was last seen on 06/21/2024 where the plan was to start her on Methotrexate. Patient declined due to concerns regarding side effects, also declined prednisone  and kevzara given concerns for side effects. She opted for trial of ibuprofen against medical advice, until patient called in late December given worsening symptoms. Prednisone  10mg  daily started at that time, with instructions to continue for 1 month (until follow-up).  Discussed the use of AI scribe software for clinical note transcription with the patient, who gave verbal consent to proceed.  History of Present Illness Tiffany Bender is a 74 year old female with polymyalgia rheumatica who presents with weakness and concerns about prednisone  side effects.  She is currently on a 10 mg daily dose of prednisone  since August 15, 2024, which has alleviated inflammation but raises concerns about side effects, particularly osteoporosis.  She previously used Advil for inflammation, which initially helped but led to side effects such as waking up extremely cold and experiencing high blood pressure at night. She discontinued Advil following these side effects.  She experienced diarrhea until two days ago, which she attributes to curamine, a medication she has since stopped. Her bowel movements are now returning to normal. She also takes a woman's probiotic, which she  feels has helped. She reports a slight weight loss, possibly due to the diarrhea.  Overall, since starting prednisone , she feels more like herself, and is able to perform daily activities such as getting out of bed and using the toilet, though she still struggles with certain activities like sitting in church pews.  She has noticed pain returning to her shoulders. Her most recent shoulder pain is less severe than before.   She describes her body as extremely sensitive, with tight clothing causing discomfort. She suspects an allergy to cashmere after developing a rash on her neck and stomach bumps after wearing cashmere clothing.  She is concerned about her osteopenia, which has been stable for years. Due to her blood clot history, she reports not being able to take calcium supplements but consumes milk and is active. She is worried about prednisone 's potential to cause osteoporosis.  She has familial hypercholesterolemia, which influences her dietary choices to manage cholesterol levels.  She denies any other new symptoms. She denies any new rashes. She denies headache, sudden vision loss, or jaw claudication symptoms. She denies any new joint pain or swelling.      Activities of Daily Living:  Patient reports morning stiffness for 0 minutes.   Patient Denies nocturnal pain.  Difficulty dressing/grooming: Denies Difficulty climbing stairs: Reports Difficulty getting out of chair: Reports Difficulty using hands for taps, buttons, cutlery, and/or writing: Reports  Review of Systems  Constitutional:  Negative for fatigue.  HENT:  Negative for  mouth sores and mouth dryness.   Eyes:  Negative for dryness.  Respiratory:  Negative for shortness of breath.   Cardiovascular:  Negative for chest pain and palpitations.  Gastrointestinal:  Positive for diarrhea. Negative for blood in stool and constipation.  Endocrine: Negative for increased urination.  Genitourinary:  Negative for involuntary  urination.  Musculoskeletal:  Positive for joint pain, joint pain, joint swelling and muscle weakness. Negative for gait problem, myalgias, morning stiffness, muscle tenderness and myalgias.  Skin:  Positive for rash. Negative for color change, hair loss and sensitivity to sunlight.  Allergic/Immunologic: Negative for susceptible to infections.  Neurological:  Negative for dizziness and headaches.  Hematological:  Negative for swollen glands.  Psychiatric/Behavioral:  Negative for depressed mood and sleep disturbance. The patient is not nervous/anxious.     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 difficile diarrhea    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 muscle vein of left lower extremity (HCC)    Colon cancer screening    DDD (degenerative disc disease), lumbar    Dyspareunia in female 08/21/2015   Fatty liver    Frequent UTI    Hyperlipidemia    IBS (irritable bowel syndrome)    KNEE PAIN, RIGHT 07/20/2006   Qualifier: Diagnosis of  By: Alvan MD, Catherine     LIVER FUNCTION TESTS, ABNORMAL 09/28/2007   Qualifier: Diagnosis of  By: Vicci LPN, Kim     Neck pain    Osteopenia    Other and unspecified ovarian cysts    history   Vaginal atrophy 09/02/2021   Varicose veins of bilateral lower extremities with other complications    Vitamin D  deficiency      Family History  Adopted: Yes  Problem Relation Age of Onset   Alcohol abuse Father    Past Surgical History:  Procedure Laterality Date   ARTHROSCOPIC REPAIR ACL     RT knee   BUNIONECTOMY  08/23/1992   bilateral   CARPAL TUNNEL RELEASE Right    CARPAL TUNNEL RELEASE Left 08/23/1998   Left    EYELID LACERATION REPAIR Bilateral 11/30/2021   OVARIAN CYST REMOVAL  08/23/1986   left    POSTERIOR LUMBAR LAMINECTOMY L4-S1 AND FUSION WITH INSTRUMENTATION, L4-L5 TRANSFORAMINAL LUMBAR INTERBODY FUSION AND BONE   04/20/2021   TONSILLECTOMY     right   TUBAL LIGATION     VEIN LIGATION AND STRIPPING  08/24/1979   Social History[1] Social History   Social History Narrative   No regular exercise. She is a futures trader. She is married to [employer]. She was adopted at age 89. She does drink coffee daily. She has 2 biological children and 2 adopted children.     Immunization History  Administered Date(s) Administered   Influenza Split 07/29/2011, 08/11/2012   Influenza Whole 06/23/2005, 06/26/2007   Influenza-Unspecified 06/08/2013   PNEUMOCOCCAL CONJUGATE-20 04/28/2022   Td  06/23/2004   Tdap 05/19/2012     Objective: Vital Signs: BP 136/75   Pulse 74   Temp (!) 97.4 F (36.3 C)   Resp 12   Ht 5' 3 (1.6 m)   Wt 137 lb 3.2 oz (62.2 kg)   BMI 24.30 kg/m    Physical Exam Vitals and nursing note reviewed.  HENT:     Head: Normocephalic and atraumatic.     Nose: Nose normal.  Eyes:     Conjunctiva/sclera: Conjunctivae normal.     Pupils: Pupils are equal, round, and reactive to light.  Pulmonary:     Effort: Pulmonary effort is normal. No respiratory distress.  Skin:    General: Skin is warm and dry.  Neurological:     Mental Status: She is alert. Mental status is at baseline.  Psychiatric:        Mood and Affect: Mood normal.        Behavior: Behavior normal.      Musculoskeletal Exam:   CDAI Exam: CDAI Score: -- Patient Global: --; Provider Global:  -- Swollen: 0 ; Tender: 0  Joint Exam 09/10/2024   All documented joints were normal     Investigation: No additional findings.  Imaging: No results found.  Recent Labs: Lab Results  Component Value Date   WBC 7.2 06/21/2024   HGB 11.8 06/21/2024   PLT 523 (H) 06/21/2024   NA 142 11/11/2023   K 4.8 11/11/2023   CL 105 11/11/2023   CO2 23 11/11/2023   GLUCOSE 76 11/11/2023   BUN 15 11/11/2023   CREATININE 0.48 (L) 06/21/2024   BILITOT 0.7 05/02/2023   ALKPHOS 49 05/02/2023   AST 20 06/21/2024   ALT 44 (H) 06/21/2024   PROT 6.5 05/02/2023   ALBUMIN 4.3 05/02/2023   CALCIUM 9.6 11/11/2023   GFRAA 87 12/27/2012    Speciality Comments: PATIENT WANTS CALLS FOR ANY MESSAGES, does not regularly check mychart  Procedures:  No procedures performed Allergies: Ciprofloxacin , Fenofibrate, Morphine, Penicillins, Statins, Sulfamethoxazole -trimethoprim, Tizanidine hcl, Amoxicillin, Dexamethasone , and Latex   Assessment / Plan:     Visit Diagnoses:  Polymyalgia rheumatica  Patient with symptoms and elevated inflammatory markers, consistent with diagnosis of polymyalgia rheumatica. As discussed with patient, most recent ACR 2015 guidelines recommend prednisone  dose starting between 12.5-25mg  daily with slow taper to 10mg  daily within 4-8 weeks, then taper by 1mg  q 4 weeks until discontinuation given that remission is maintained. She has been on 10mg  daily for 1 month as off 09/16/23. Pending her markers of inflammation (will recheck today), will either increase to 12.5mg  daily for 14 days then 10mg  daily for 14 days then taper as directed below; or start taper down by 1mg  q 30 days until completion.  High risk medication use  On prednisone  for PMR. Discussed steroid side effects including weight gain, decreased bone density/fractures, risk for infection, avascular necrosis, hypertension, hyperglycemia, dyspepsia/gastric ulcers, fluid retention, weakness, bruising, acne, cataracts,  insomnia, mood problems. Patient also instructed to take this medication with food, and not to take this medication with NSAIDs such as Aleve or Ibuprofen.    Osteoporosis, unspecified osteoporosis type, unspecified pathological fracture presence  Long term corticosteroid use Plan: DG BONE DENSITY (DXA). Information given to patient on Fosamax given long term steroid use  Orders: Orders Placed This Encounter  Procedures   DG BONE DENSITY (DXA)   Sedimentation rate   C-reactive protein   No orders of the defined types were placed in this encounter.  I personally spent a total of 40 minutes in the care of the patient today including preparing to see the patient, getting/reviewing separately obtained history, performing a medically appropriate exam/evaluation, counseling and educating, placing orders, documenting clinical information in the EHR, and independently interpreting results.   Follow-Up Instructions: Return in about 2 months (around 11/08/2024).   Asberry Claw, DO  Note - This record has been created using Animal nutritionist.  Chart creation errors have been sought, but may not always  have been located. Such creation errors do not reflect on  the standard of medical care.       [1]  Social History Tobacco Use   Smoking status: Former    Current packs/day: 0.00    Types: Cigarettes    Quit date: 08/24/1983    Years since quitting: 41.0    Passive exposure: Never   Smokeless tobacco: Never  Vaping Use   Vaping status: Never Used  Substance Use Topics   Alcohol use: Not Currently    Comment: socially   Drug use: No   "

## 2024-09-10 ENCOUNTER — Ambulatory Visit

## 2024-09-10 VITALS — BP 136/75 | HR 74 | Temp 97.4°F | Resp 12 | Ht 63.0 in | Wt 137.2 lb

## 2024-09-10 DIAGNOSIS — Z79899 Other long term (current) drug therapy: Secondary | ICD-10-CM | POA: Diagnosis present

## 2024-09-10 DIAGNOSIS — M353 Polymyalgia rheumatica: Secondary | ICD-10-CM | POA: Insufficient documentation

## 2024-09-10 DIAGNOSIS — M810 Age-related osteoporosis without current pathological fracture: Secondary | ICD-10-CM | POA: Insufficient documentation

## 2024-09-10 NOTE — Patient Instructions (Addendum)
" °  IF LABS LOOK GOOD, CONTINUE WITH REGULAR TAPER Please decrease to Prednisone  9mg  daily, continue for 30 days. THEN  Decrease to Prednisone  8mg  daily, continue for 30 days. THEN  Decrease to Prednisone  7mg  daily, continue for 30 days. THEN  Decrease to Prednisone  6mg  daily, continue for 30 days. THEN  Decrease to Prednisone  5mg  daily, continue UNTIL YOU FOLLOW-UP WITH ME.  IF LABS ARE ABNORMAL, WILL INCREASE PREDNISONE  TO 12.5mg  daily for 14 days, then will decrease to 10mg  daily for 14 days, then continue taper as above "

## 2024-09-11 ENCOUNTER — Other Ambulatory Visit: Payer: Self-pay

## 2024-09-11 ENCOUNTER — Ambulatory Visit: Payer: Self-pay

## 2024-09-11 LAB — SEDIMENTATION RATE: Sed Rate: 2 mm/h (ref 0–30)

## 2024-09-11 LAB — C-REACTIVE PROTEIN: CRP: <3 mg/L

## 2024-09-11 MED ORDER — PREDNISONE 1 MG PO TABS: ORAL_TABLET | ORAL | 0 refills | Status: DC

## 2024-09-11 MED ORDER — PREDNISONE 1 MG PO TABS: ORAL_TABLET | ORAL | 0 refills | Status: AC

## 2024-09-11 NOTE — Telephone Encounter (Signed)
 Patient wants medication sent to a different pharmacy

## 2024-10-31 ENCOUNTER — Other Ambulatory Visit

## 2024-11-07 ENCOUNTER — Ambulatory Visit
